# Patient Record
Sex: Female | Born: 1975 | Race: White | Hispanic: No | Marital: Married | Smoking: Never smoker
Health system: Southern US, Community
[De-identification: ages and names within clinical notes are randomized; demographics above are authoritative.]

## PROBLEM LIST (undated history)

## (undated) DIAGNOSIS — J189 Pneumonia, unspecified organism: Secondary | ICD-10-CM

## (undated) DIAGNOSIS — C719 Malignant neoplasm of brain, unspecified: Secondary | ICD-10-CM

## (undated) DIAGNOSIS — N200 Calculus of kidney: Secondary | ICD-10-CM

## (undated) DIAGNOSIS — D496 Neoplasm of unspecified behavior of brain: Secondary | ICD-10-CM

## (undated) DIAGNOSIS — L509 Urticaria, unspecified: Secondary | ICD-10-CM

## (undated) DIAGNOSIS — J069 Acute upper respiratory infection, unspecified: Secondary | ICD-10-CM

## (undated) DIAGNOSIS — J45909 Unspecified asthma, uncomplicated: Secondary | ICD-10-CM

## (undated) DIAGNOSIS — M1990 Unspecified osteoarthritis, unspecified site: Secondary | ICD-10-CM

## (undated) DIAGNOSIS — G43909 Migraine, unspecified, not intractable, without status migrainosus: Secondary | ICD-10-CM

## (undated) DIAGNOSIS — C801 Malignant (primary) neoplasm, unspecified: Secondary | ICD-10-CM

## (undated) DIAGNOSIS — F419 Anxiety disorder, unspecified: Secondary | ICD-10-CM

## (undated) HISTORY — PX: CYSTOSTOMY W/ STENT INSERTION: SHX1434

## (undated) HISTORY — DX: Acute upper respiratory infection, unspecified: J06.9

## (undated) HISTORY — PX: LASER ABLATION OF THE CERVIX: SHX1949

## (undated) HISTORY — PX: CHOLECYSTECTOMY: SHX55

## (undated) HISTORY — DX: Urticaria, unspecified: L50.9

---

## 2002-04-12 ENCOUNTER — Other Ambulatory Visit: Admission: RE | Admit: 2002-04-12 | Discharge: 2002-04-12 | Payer: Self-pay | Admitting: Obstetrics and Gynecology

## 2003-01-25 ENCOUNTER — Encounter: Payer: Self-pay | Admitting: Emergency Medicine

## 2003-01-25 ENCOUNTER — Inpatient Hospital Stay (HOSPITAL_COMMUNITY): Admission: EM | Admit: 2003-01-25 | Discharge: 2003-01-27 | Payer: Self-pay | Admitting: Emergency Medicine

## 2003-01-27 ENCOUNTER — Encounter: Payer: Self-pay | Admitting: Urology

## 2003-02-07 ENCOUNTER — Emergency Department (HOSPITAL_COMMUNITY): Admission: EM | Admit: 2003-02-07 | Discharge: 2003-02-08 | Payer: Self-pay | Admitting: Emergency Medicine

## 2003-02-08 ENCOUNTER — Encounter: Payer: Self-pay | Admitting: Emergency Medicine

## 2003-02-20 ENCOUNTER — Encounter: Payer: Self-pay | Admitting: Emergency Medicine

## 2003-02-20 ENCOUNTER — Emergency Department (HOSPITAL_COMMUNITY): Admission: EM | Admit: 2003-02-20 | Discharge: 2003-02-20 | Payer: Self-pay | Admitting: Emergency Medicine

## 2003-02-22 ENCOUNTER — Ambulatory Visit (HOSPITAL_COMMUNITY): Admission: RE | Admit: 2003-02-22 | Discharge: 2003-02-22 | Payer: Self-pay | Admitting: Urology

## 2003-02-22 ENCOUNTER — Encounter: Payer: Self-pay | Admitting: Urology

## 2003-04-15 ENCOUNTER — Other Ambulatory Visit: Admission: RE | Admit: 2003-04-15 | Discharge: 2003-04-15 | Payer: Self-pay | Admitting: Obstetrics and Gynecology

## 2003-06-04 ENCOUNTER — Emergency Department (HOSPITAL_COMMUNITY): Admission: EM | Admit: 2003-06-04 | Discharge: 2003-06-04 | Payer: Self-pay | Admitting: Emergency Medicine

## 2003-07-31 ENCOUNTER — Ambulatory Visit (HOSPITAL_COMMUNITY): Admission: RE | Admit: 2003-07-31 | Discharge: 2003-07-31 | Payer: Self-pay | Admitting: Obstetrics and Gynecology

## 2003-08-27 ENCOUNTER — Inpatient Hospital Stay (HOSPITAL_COMMUNITY): Admission: AD | Admit: 2003-08-27 | Discharge: 2003-08-27 | Payer: Self-pay | Admitting: Obstetrics and Gynecology

## 2003-08-28 ENCOUNTER — Inpatient Hospital Stay (HOSPITAL_COMMUNITY): Admission: AD | Admit: 2003-08-28 | Discharge: 2003-08-28 | Payer: Self-pay | Admitting: Obstetrics and Gynecology

## 2003-10-09 ENCOUNTER — Inpatient Hospital Stay (HOSPITAL_COMMUNITY): Admission: AD | Admit: 2003-10-09 | Discharge: 2003-10-09 | Payer: Self-pay | Admitting: Obstetrics and Gynecology

## 2003-11-01 ENCOUNTER — Inpatient Hospital Stay (HOSPITAL_COMMUNITY): Admission: AD | Admit: 2003-11-01 | Discharge: 2003-11-21 | Payer: Self-pay | Admitting: Obstetrics and Gynecology

## 2003-11-22 ENCOUNTER — Inpatient Hospital Stay (HOSPITAL_COMMUNITY): Admission: AD | Admit: 2003-11-22 | Discharge: 2003-12-06 | Payer: Self-pay | Admitting: Obstetrics and Gynecology

## 2003-12-17 ENCOUNTER — Inpatient Hospital Stay (HOSPITAL_COMMUNITY): Admission: RE | Admit: 2003-12-17 | Discharge: 2003-12-20 | Payer: Self-pay | Admitting: Obstetrics and Gynecology

## 2004-02-06 ENCOUNTER — Ambulatory Visit (HOSPITAL_COMMUNITY): Admission: RE | Admit: 2004-02-06 | Discharge: 2004-02-06 | Payer: Self-pay | Admitting: Urology

## 2004-12-23 ENCOUNTER — Other Ambulatory Visit: Admission: RE | Admit: 2004-12-23 | Discharge: 2004-12-23 | Payer: Self-pay | Admitting: Obstetrics and Gynecology

## 2005-08-28 ENCOUNTER — Emergency Department (HOSPITAL_COMMUNITY): Admission: EM | Admit: 2005-08-28 | Discharge: 2005-08-28 | Payer: Self-pay | Admitting: Emergency Medicine

## 2006-04-26 ENCOUNTER — Emergency Department (HOSPITAL_COMMUNITY): Admission: EM | Admit: 2006-04-26 | Discharge: 2006-04-26 | Payer: Self-pay | Admitting: Emergency Medicine

## 2006-06-17 ENCOUNTER — Emergency Department (HOSPITAL_COMMUNITY): Admission: EM | Admit: 2006-06-17 | Discharge: 2006-06-17 | Payer: Self-pay | Admitting: Emergency Medicine

## 2007-05-25 ENCOUNTER — Inpatient Hospital Stay (HOSPITAL_COMMUNITY): Admission: EM | Admit: 2007-05-25 | Discharge: 2007-05-26 | Payer: Self-pay | Admitting: Emergency Medicine

## 2007-05-29 ENCOUNTER — Emergency Department (HOSPITAL_COMMUNITY): Admission: EM | Admit: 2007-05-29 | Discharge: 2007-05-30 | Payer: Self-pay | Admitting: Emergency Medicine

## 2009-10-19 ENCOUNTER — Emergency Department (HOSPITAL_COMMUNITY): Admission: EM | Admit: 2009-10-19 | Discharge: 2009-10-19 | Payer: Self-pay | Admitting: Emergency Medicine

## 2010-04-21 ENCOUNTER — Ambulatory Visit (HOSPITAL_COMMUNITY): Payer: Self-pay | Admitting: Psychiatry

## 2010-06-19 ENCOUNTER — Encounter: Admission: RE | Admit: 2010-06-19 | Discharge: 2010-06-19 | Payer: Self-pay | Admitting: Family Medicine

## 2010-06-27 ENCOUNTER — Encounter: Admission: RE | Admit: 2010-06-27 | Discharge: 2010-06-27 | Payer: Self-pay | Admitting: Neurological Surgery

## 2010-09-15 ENCOUNTER — Emergency Department (HOSPITAL_BASED_OUTPATIENT_CLINIC_OR_DEPARTMENT_OTHER)
Admission: EM | Admit: 2010-09-15 | Discharge: 2010-09-15 | Payer: Self-pay | Source: Home / Self Care | Admitting: Emergency Medicine

## 2010-09-16 ENCOUNTER — Inpatient Hospital Stay (HOSPITAL_COMMUNITY)
Admission: EM | Admit: 2010-09-16 | Discharge: 2010-09-21 | Payer: Self-pay | Source: Home / Self Care | Attending: Internal Medicine | Admitting: Internal Medicine

## 2010-10-24 ENCOUNTER — Encounter: Payer: Self-pay | Admitting: Obstetrics and Gynecology

## 2010-12-14 LAB — CBC
HCT: 29.8 % — ABNORMAL LOW (ref 36.0–46.0)
HCT: 35.6 % — ABNORMAL LOW (ref 36.0–46.0)
MCHC: 33.6 g/dL (ref 30.0–36.0)
MCHC: 34.6 g/dL (ref 30.0–36.0)
MCV: 87.4 fL (ref 78.0–100.0)
MCV: 87.4 fL (ref 78.0–100.0)
MCV: 87.7 fL (ref 78.0–100.0)
Platelets: 128 10*3/uL — ABNORMAL LOW (ref 150–400)
Platelets: 145 10*3/uL — ABNORMAL LOW (ref 150–400)
Platelets: 163 10*3/uL (ref 150–400)
Platelets: 245 10*3/uL (ref 150–400)
RBC: 4.06 MIL/uL (ref 3.87–5.11)
RDW: 12.6 % (ref 11.5–15.5)
RDW: 12.9 % (ref 11.5–15.5)
RDW: 13 % (ref 11.5–15.5)
RDW: 13.1 % (ref 11.5–15.5)
WBC: 5.5 10*3/uL (ref 4.0–10.5)
WBC: 6 10*3/uL (ref 4.0–10.5)
WBC: 7.2 10*3/uL (ref 4.0–10.5)
WBC: 7.9 10*3/uL (ref 4.0–10.5)

## 2010-12-14 LAB — BASIC METABOLIC PANEL
BUN: 2 mg/dL — ABNORMAL LOW (ref 6–23)
BUN: 3 mg/dL — ABNORMAL LOW (ref 6–23)
BUN: 6 mg/dL (ref 6–23)
CO2: 14 mEq/L — ABNORMAL LOW (ref 19–32)
Calcium: 8.1 mg/dL — ABNORMAL LOW (ref 8.4–10.5)
Calcium: 8.1 mg/dL — ABNORMAL LOW (ref 8.4–10.5)
Calcium: 8.4 mg/dL (ref 8.4–10.5)
Chloride: 112 mEq/L (ref 96–112)
Chloride: 114 mEq/L — ABNORMAL HIGH (ref 96–112)
Creatinine, Ser: 0.65 mg/dL (ref 0.4–1.2)
Creatinine, Ser: 0.7 mg/dL (ref 0.4–1.2)
GFR calc Af Amer: >60 mL/min (ref >60)
GFR calc Af Amer: >60 mL/min (ref >60)
GFR calc Af Amer: >60 mL/min (ref >60)
GFR calc non Af Amer: >60 mL/min (ref >60)
GFR calc non Af Amer: >60 mL/min (ref >60)
GFR calc non Af Amer: >60 mL/min (ref >60)
GFR calc non Af Amer: >60 mL/min (ref >60)
Glucose, Bld: 91 mg/dL (ref 70–99)
Glucose, Bld: 92 mg/dL (ref 70–99)
Potassium: 3.1 mEq/L — ABNORMAL LOW (ref 3.5–5.1)
Potassium: 3.1 mEq/L — ABNORMAL LOW (ref 3.5–5.1)
Potassium: 3.5 mEq/L (ref 3.5–5.1)
Potassium: 3.6 mEq/L (ref 3.5–5.1)
Sodium: 135 mEq/L (ref 135–145)
Sodium: 137 mEq/L (ref 135–145)
Sodium: 140 mEq/L (ref 135–145)

## 2010-12-14 LAB — DIFFERENTIAL
Basophils Absolute: 0 10*3/uL (ref 0.0–0.1)
Basophils Absolute: 0.1 10*3/uL (ref 0.0–0.1)
Eosinophils Relative: 0 % (ref 0–5)
Eosinophils Relative: 2 % (ref 0–5)
Lymphocytes Relative: 24 % (ref 12–46)
Lymphocytes Relative: 39 % (ref 12–46)
Lymphs Abs: 2.3 10*3/uL (ref 0.7–4.0)
Monocytes Relative: 10 % (ref 3–12)
Neutro Abs: 2.9 10*3/uL (ref 1.7–7.7)
Neutro Abs: 4.6 10*3/uL (ref 1.7–7.7)
Neutrophils Relative %: 59 % (ref 43–77)

## 2010-12-14 LAB — URINALYSIS, ROUTINE W REFLEX MICROSCOPIC
Protein, ur: 30 mg/dL — AB
Specific Gravity, Urine: 1.021 (ref 1.005–1.030)
Urobilinogen, UA: 1 mg/dL (ref 0.0–1.0)

## 2010-12-14 LAB — GIARDIA/CRYPTOSPORIDIUM SCREEN(EIA)
Cryptosporidium Screen (EIA): NEGATIVE
Cryptosporidium Screen (EIA): NEGATIVE
Giardia Screen - EIA: NEGATIVE
Giardia Screen - EIA: NEGATIVE

## 2010-12-14 LAB — POCT I-STAT, CHEM 8
BUN: 9 mg/dL (ref 6–23)
Calcium, Ion: 1.1 mmol/L — ABNORMAL LOW (ref 1.12–1.32)
Creatinine, Ser: 1 mg/dL (ref 0.4–1.2)
Glucose, Bld: 94 mg/dL (ref 70–99)
Hemoglobin: 9.9 g/dL — ABNORMAL LOW (ref 12.0–15.0)
TCO2: 19 mmol/L (ref 0–100)

## 2010-12-14 LAB — STOOL CULTURE

## 2010-12-14 LAB — FECAL LACTOFERRIN, QUANT: Fecal Lactoferrin: POSITIVE

## 2010-12-14 LAB — HEMOCCULT GUIAC POC 1CARD (OFFICE): Fecal Occult Bld: POSITIVE

## 2010-12-14 LAB — URINE MICROSCOPIC-ADD ON

## 2010-12-14 LAB — CLOSTRIDIUM DIFFICILE BY PCR: Toxigenic C. Difficile by PCR: NEGATIVE

## 2010-12-15 LAB — URINALYSIS, ROUTINE W REFLEX MICROSCOPIC
Leukocytes, UA: NEGATIVE
Nitrite: NEGATIVE
Specific Gravity, Urine: 1.027 (ref 1.005–1.030)
pH: 6 (ref 5.0–8.0)

## 2010-12-15 LAB — PREGNANCY, URINE: Preg Test, Ur: NEGATIVE

## 2010-12-15 LAB — URINE MICROSCOPIC-ADD ON

## 2010-12-15 LAB — CBC
Hemoglobin: 13.7 g/dL (ref 12.0–15.0)
MCV: 85.8 fL (ref 78.0–100.0)
Platelets: 148 10*3/uL — ABNORMAL LOW (ref 150–400)
RBC: 4.58 MIL/uL (ref 3.87–5.11)
WBC: 7.1 10*3/uL (ref 4.0–10.5)

## 2010-12-15 LAB — DIFFERENTIAL
Lymphocytes Relative: 12 % (ref 12–46)
Lymphs Abs: 0.8 10*3/uL (ref 0.7–4.0)
Neutrophils Relative %: 76 % (ref 43–77)

## 2010-12-15 LAB — BASIC METABOLIC PANEL
Chloride: 106 mEq/L (ref 96–112)
Creatinine, Ser: 0.8 mg/dL (ref 0.4–1.2)
GFR calc Af Amer: >60 mL/min (ref >60)
Sodium: 140 mEq/L (ref 135–145)

## 2010-12-20 LAB — POCT PREGNANCY, URINE: Preg Test, Ur: NEGATIVE

## 2010-12-20 LAB — URINALYSIS, ROUTINE W REFLEX MICROSCOPIC
Bilirubin Urine: NEGATIVE
Hgb urine dipstick: NEGATIVE
Nitrite: NEGATIVE
Specific Gravity, Urine: 1.013 (ref 1.005–1.030)
pH: 6 (ref 5.0–8.0)

## 2010-12-20 LAB — URINE CULTURE

## 2010-12-20 LAB — URINE MICROSCOPIC-ADD ON

## 2011-02-16 NOTE — Discharge Summary (Signed)
NAMEDAVONA, KINOSHITA NO.:  [id number]   MEDICAL RECORD NO.:  [id number]          PATIENT TYPE:  INP   LOCATION:  1428                         FACILITY:  [facility]   PHYSICIAN:  Lindaann Slough, M.D.  DATE OF BIRTH:  12/23/1975   DATE OF ADMISSION:  05/24/2007  DATE OF DISCHARGE:                               DISCHARGE SUMMARY   DISCHARGE DIAGNOSIS:  Left ureteral stone, bilateral renal calculi.   PROCEDURE:  Cystoscopy with left retrograde pyelogram, ureteroscopy,  stone extraction and insertion of double-J catheter on May 26, 2007.   The patient is a 35 year old female who was admitted on May 24, 2007  with left flank and left lower quadrant pain associated with nausea and  vomiting and difficulty voiding.  She has a history of kidney stones and  she has not had any problems with kidney stones for about 2 years.  She  had ESL in the past.  CT scan showed bilateral renal calculi and a 6 x  10 mm stone in the left distal ureter.  The patient was admitted for  analgesics and treatment of the ureteral calculus.  She continued to  complain of pain and on 08/22 she had cystoscopy and stone extraction.Marland Kitchen  Postoperatively she did well.  She did not have any more flank pain.  She had left flank discomfort.  She was voiding on her own.  She was  then discharged home on Dilaudid 4 mg every 4-6 hours  p.r.n. for pain,  Ditropan 5 mg three times a day, Phenergan 25 mg every 6 hours p.r.n.  for nausea and vomiting.  Lamictal 300 mg a day, Citracal Plus 200 mg  twice a day,  Omeprazole 20 mg a day, Pulmicort 1 puff twice a day,  Rhinocort 1 spray each nostril daily, Zoloft 200 mg daily,  Zonisamide  300 mg at night.  Zyrtec 10 mg at night.   CONDITION ON DISCHARGE:  Improved.   DISCHARGE DIET:  Regular.   The patient may resume her prehospital activities.  She will return to  the office in 3 days for stent removal.      Lindaann Slough, M.D.  Electronically  Signed     MN/MEDQ  D:  05/26/2007  T:  05/28/2007  Job:  [id number]

## 2011-02-16 NOTE — Op Note (Signed)
[name], SEN NO.:  [id number]   MEDICAL RECORD NO.:  [id number]          PATIENT TYPE:  INP   LOCATION:  1428                         FACILITY:  [facility]   PHYSICIAN:  Lindaann Slough, M.D.  DATE OF BIRTH:  September 08, 1976   DATE OF PROCEDURE:  05/26/2007  DATE OF DISCHARGE:                               OPERATIVE REPORT   PREOPERATIVE DIAGNOSIS:  Left ureteral stone.   POSTOPERATIVE DIAGNOSIS:  Left ureteral stone.   PROCEDURE:  Cystoscopy, left retrograde pyelogram, ureteroscopy with  Holmium laser, stone extraction, and insertion of the left double-J  catheter.   SURGEON:  Danae Chen, M.D.   ANESTHESIA:  General.   INDICATIONS:  The patient is a 35 year old female who was admitted two  days ago with sudden onset of severe left flank and left lower quadrant  pain.  CT scan showed a 6 x 10 mm stone in the left distal ureter.  She  was then treated with IV analgesic, however, she continued to complain  of pain.  She is brought to the OR for cystoscopy and stone  manipulation.   DESCRIPTION OF PROCEDURE:  Under general anesthesia, the patient was  prepped and draped and placed in the dorsal lithotomy position.  A  panendoscope was inserted in the bladder.  The bladder mucosa is normal.  There is no stone or tumor in the bladder.  The ureteral orifices are in  normal position and shape.   Retrograde pyelogram:   A cone tip catheter was passed through the cystoscope into the left  ureteral orifice. Contrast was then injected through the cone tipped  catheter.  There is a filling defect in the distal ureter and there is a  moderate proximal hydroureter.  The renal pelvis and calices appear  normal.  The cone tipped catheter was then removed.   A Sensor tip guidewire was then passed through the cystoscope into the  left ureter.  The cystoscope was then removed.  A number 6.5 French  semirigid ureteroscope was then passed in the bladder and into the  ureter.  There was some edema of the distal ureter distal to the stone  which was then visualized.  With the 365 microfiber Holmium laser, the  stone was broken up in multiple small fragments.  The stone fragments  were then removed with the nitinol basket and extracted.   Second retrograde pyelogram:   Contrast was injected through the ureteroscope and there was no evidence  of filling defect in the ureter.  There is no extravasation of contrast.   The ureteroscope was then removed.  The guidewire was then back loaded  into the cystoscope and a 6-French 26 double-J catheter was passed over  the guidewire.  The proximal curl of the double-J catheter is in the  renal pelvis.  The distal curl is in the bladder.  The bladder was then  emptied and the cystoscope and guidewire removed.  The patient tolerated  the procedure well and left the OR in satisfactory condition to the post  anesthesia care unit.      Lindaann Slough, M.D.  Electronically Signed  MN/MEDQ  D:  05/26/2007  T:  05/27/2007  Job:  [id number]

## 2011-02-19 NOTE — H&P (Signed)
NAMEMarland Snyder  Christy, Snyder NO.:  [id number]   MEDICAL RECORD NO.:  [id number]                   PATIENT TYPE:  MAT   LOCATION:  MATC                                 FACILITY:  [facility]   PHYSICIAN:  Crist Fat. Rivard, M.D.              DATE OF BIRTH:  [birth year]   DATE OF ADMISSION:  11/22/2003  DATE OF DISCHARGE:                                HISTORY & PHYSICAL   HISTORY OF PRESENT ILLNESS:  [name] is a 35 year old gravida 2 para 1-  0-0-1 at 74 and five-sevenths weeks who is admitted for recurrence of kidney  stone pain.  Her history was remarkable for being admitted on November 01, 2003 with severe kidney stone pain.  She was diagnosed as having three  kidney stones at that time and passed one during her hospitalization.  She  was discharged home on November 21, 2003 and was hoping to be managed on  p.o. pain medication at home.  However, her pain began to increase at 5 a.m.  this morning and since that time she has had nausea and vomiting with the  inability to keep any food or fluids down.  Her last medication was Percocet  at 9 a.m. today.  She denies any abdominal cramping, vaginal discharge or  bleeding, or uterine contractions.  She does report positive fetal movement.  She denies any fever or any other issues.  She is complaining of bilateral  CVA tenderness although it is slightly more on the left than on the right.   Pregnancy has been remarkable for:  1. Recurrent kidney stones, multiple episodes since 2001 delivery.  2. Previous cesarean section with a plan for repeat.  3. Rh negative.  4. History of postpartum depression.  5. History of OCD.  6. Asthma.  7. Gastroesophageal reflux disease.  8. Migraines.   PRENATAL LABORATORY DATA:  Blood type is O negative, Rh antibody negative.  VDRL nonreactive.  Rubella titer positive.  Hepatitis B surface antigen  negative.  HIV nonreactive.  GC and chlamydia cultures were negative.  Pap  was  normal.  Hemoglobin upon entering the practice was 11.8; it was 10.2 on  November 13, 2003.  EDC of January 04, 2004 was established by last menstrual  period and was in agreement with ultrasounds at approximately 8 and 18  weeks.   HISTORY OF PRESENT PREGNANCY:  The patient entered care at approximately 11  weeks.  She had had a previous C-section and was originally undecided about  VBAC or repeat cesarean.  She now wishes to proceed with repeat cesarean  after 37 weeks.  She does have a history of migraines.  She is on  medications for her asthma.  She did have a syncopal spell at 19 weeks but  had no problems from that.  [facility] has [facility]atica.  She has also had some episodes of  diarrhea.  She was referred to Dr. Blossom Hoops office for GI evaluation.  She had kidney  stones in December but did pass those spontaneously.  The rest of her  pregnancy was uncomplicated until January 28 when she began to have  bilateral kidney stone pain again and was admitted at that time until  February 17.   In the course of that hospitalization she was maintained on a morphine PCA.  She had originally been started on Dilaudid PCA but that caused increased  itching.  She then was placed on p.o. medications but this was inadequate  for pain control.  She was then placed on the morphine PCA.  She passed a  stone on January 30.  Dr. Brunilda Payor was consulted as the patient's urologist.  She was also placed on Septra.  She had an ultrasound at 31-and-a-half weeks  that showed normal growth and development with growth of greater than the  95th percentile.  Renal ultrasound showed definitive stones.  She had a  migraine at approximately 31-and-a-half weeks.  She declined ureteral  stents, betamethasone, and a amniocentesis.  She had a urine culture that  had 60,000 colonies of enterococcus that was sensitive to ampicillin,  vancomycin, nitrofurantoin, and levofloxacin.   She was changed from [place]  to [facility] on November 07, 2003.  She did have some emotional upsets with  the long-term hospitalization and depression.  Fetal heart rate throughout  her hospitalization remained reassuring.  She had no contractions.  She did  begin to have some difficulty with constipation on February 7.  She had  another renal ultrasound on February 7 that showed two calcifications in the  left kidney.  She began to have more pain with her stones as she felt that  she might be passing them on November 12, 2003.  She had an epidural placed  on February 9 secondary to increased pain.  She did have some constipation  at that time that was controlled with Imodium.  She was again placed on  morphine PCA after the epidural was discontinued.  She was given a NICU  tour. On February 17 the patient had p.o. Percocet that morning with good  relief.  She had not used the PCA that morning.  She really did want to go  home, so she had an NST that day that was normal.  She was given a  prescription for Percocet and was sent home.  There was a discussion with  Dr. Pennie Rushing and Dr. Eric Form, neonatologist, regarding the possibility of  habituation for the baby to morphine as a pain medication, that delivery now  would probably be associated with some degree of fetal withdrawal.  The  decision was made for the patient to go home in agreement with her wishes,  and the patient had decided she would try to use her p.o. narcotics  minimally.  She was therefore discharged home on February 17 and continued  to plan a C-section no earlier than the completion of 37 weeks.   OBSTETRICAL HISTORY:  In 2001 she had a primary low transverse cesarean  section for a female infant that weighed 8 pounds 12 ounces at 40 and five-  sevenths weeks.  She was sectioned for failure to progress.  She had an  epidural.  She did have postpartum anemia.  She had postpartum depression. She was Rh negative and did receive  RhoGAM.  She was also positive for beta  strep during that time.   MEDICAL HISTORY:  She has a history of gastroesophageal reflux disease.  She  is being treated for OCD by Rowland Lathe, the psychiatrist.  She has been  on Zoloft during her pregnancy to a maximum of 200 mg a day which was being  trying to be weaned down.  She is now on 150 mg per day.  She also has a  history of asthma for which she uses medications Pulmicort, albuterol,  Rhinocort, and Zyrtec.  She uses Prevacid for her gastroesophageal reflux  disease.   ALLERGIES:  PENICILLIN causes anaphylactic shock.   FAMILY HISTORY:  Maternal grandmother, paternal grandmother, and paternal  grandfather all have heart disease.  Mother has hypertension.  Paternal  grandmother has diabetes. Paternal grandmother was an alcoholic.   GENETIC HISTORY:  Unremarkable.   SOCIAL HISTORY:  The patient is married to the father of the baby.  He is  involved and supportive.  His name is [employer].  The patient is  graduate-educated.  She is a Futures trader.  Her husband is also graduate-  educated.  He is a Production designer, theatre/television/film.  She has been followed by the physician service  at [facility].  She denies any alcohol, drug, or tobacco use during  this pregnancy   CURRENT MEDICATIONS:  1. Albuterol two puffs q.6h. p.r.n.  2. One Source prenatal vitamin two caplets once a day.  3. Prevacid 30 mg one p.o. daily.  4. Pulmicort two puffs twice daily.  5. Rhinocort Aqua one spray each nostril daily.  6. Zoloft 150 mg currently daily.  7. Zyrtec 10 mg one p.o. q.h.s.   PHYSICAL EXAMINATION:  GENERAL:  The patient is a well-developed white  female in moderate distress secondary to kidney stone pain.  VITAL SIGNS:  Stable, the patient is afebrile.  HEENT:  Within normal limits.  LUNGS:  Bilateral breath sounds are clear.  HEART:  Regular rate and rhythm without murmur.  BREASTS:  Soft and nontender.  ABDOMEN:  Fundal height is approximately 31  cm.  Abdomen is soft and  generally nontender except in the left lower groin area with mild  tenderness.  There is no rebound, guarding, or other pain.  Positive CVA  tenderness is noted on both sides.  Fetal monitoring reveals a reactive  fetal heart rate tracing with no decelerations and no contractions are  noted.  PELVIC:  Deferred at this time.  LABORATORY DATA:  CBC shows a hemoglobin of 10.6, hematocrit of 31.8, white  blood cell count of 11.7, and platelets of 291.  Comprehensive metabolic  panel is normal with a BUN of 6, a creatinine of 0.4, glucose of 82,  potassium 3.8.  Liver function tests are normal.  BACK:  Bilateral CVA tenderness is noted.  EXTREMITIES:  Deep tendon reflexes 2+ without clonus.  There is a trace  edema noted.   IMPRESSION:  1. Intrauterine pregnancy at 6 and five-sevenths weeks.  2. Kidney stones with recurrent pain.  3. Depression.  4. History of obsessive-compulsive disorder.  5. Asthma. 6. Gastroesophageal reflux disease.   PLAN:  1. Admit for 23-hour observation per consult with Dr. Estanislado Pandy as attending     physician.  2. IV of LR at 250 mL/hour.  3. Morphine PCA.  4. Medications:     a. Albuterol two puffs q.6h. p.r.n.     b. Prenatal vitamin one p.o. daily.     c. Prevacid 30 mg daily.     d. Pulmicort two puffs twice daily.  e. Rhinocort Aqua one spray each nostril daily.     f. Zoloft 150 mg p.o. daily.     g. Zyrtec 10 mg one p.o. q.h.s.     h. Phenergan 25 mg IV q.6h. p.r.n. for nausea.        a. Morphine PCA initially with half dose then may advance to full dose           on a p.r.n. basis.  5. NST daily.     Renaldo Reel Emilee Hero, C.N.M.                   Crist Fat Rivard, M.D.    Leeanne Mannan  D:  11/22/2003  T:  11/22/2003  Job:  [id number]

## 2011-02-19 NOTE — H&P (Signed)
NAME:  [name], [name]                    ACCOUNT NO.:  [id number]   MEDICAL RECORD NO.:  [id number]                   PATIENT TYPE:  INP   LOCATION:  9198                                 FACILITY:  [facility]   PHYSICIAN:  Hal Morales, M.D.             DATE OF BIRTH:  [birth year]   DATE OF ADMISSION:  12/17/2003  DATE OF DISCHARGE:                                HISTORY & PHYSICAL   HISTORY OF PRESENT ILLNESS:  Ms. Doland is a 35 year old, married, white  female, G2, P1-0-0-1 at 33 weeks' gestation admitted for elective repeat  Cesarean section today.  She denies any regular uterine contractions.  She  reports positive fetal movement.  She denies any leaking or vaginal  bleeding.  Her pregnancy has been followed at [facility] by the  M.D. service and has been complicated by kidney stones requiring  hospitalization and p.o. morphine at home recently, history of previous  Cesarean section, history of OCD and depression, history of asthma and  gastroesophageal reflux disease.   PAST OBSTETRICAL HISTORY:  She is a G2, P1-0-0-1 who had a primary Cesarean  section for delivery a viable female infant for failure to progress in May  2001.  The baby weighed 8 pounds 12 ounces.  Her LMP for this pregnancy was  March 29, 2003, given her an [facility] of January 04, 2004, confirmed by ultrasound.  She is Rh negative and has received RhoGAM with this pregnancy.   ALLERGIES:  PENICILLIN that causes anaphylaxis.   PAST MEDICAL HISTORY:  1. She reports having the usual childhood diseases.  2. She reports a history of asthma requiring Rhinocort, Pulmicort and     albuterol p.r.n.  3. History of GERD with this pregnancy and has taken Nexium.  4. OCD/depression for which she has been on Zoloft 200 mg q.d. with this     pregnancy.  5. History of kidney stones that she initially passed in November 2001,     February 2002, May 2004, and throughout this pregnancy was hospitalized     since  late January through end of February for IV pain  medicine     management of her kidney stones.  She ultimately passed a couple of     kidney stones and went home and did have p.o. morphine to take as needed     for pain there, but has done well subsequent to passing the kidney     stones.   PAST SURGICAL HISTORY:  1. Laparoscopic cholecystectomy in 2001.  2. Wisdom teeth removal.   FAMILY HISTORY:  Significant for maternal grandmother, paternal grandmother  and paternal grandfather with heart disease.  Mother with chronic  hypertension.  Paternal grandmother with diabetes.  Paternal grandmother  with alcohol use.   GENETIC HISTORY:  Negative.   SOCIAL HISTORY:  She is married to [employer] who is involved and  supportive.  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  He  is also Masters Degree educated and employed full-time.  They are of the  [place] faith.  They deny any illicit drug use, alcohol or smoking during  this pregnancy.   PRENATAL LABORATORY DATA:  Blood type O negative, antibody screen negative,  Syphilis nonreactive.  Rubella positive.  Hepatitis B surface antigen is  negative.  HIV nonreactive.  GC and Chlamydia both negative.  Pap smear  within normal limits in July 2004.  Her TSH was normal and her one-hour  Glucola was normal throughout this pregnancy.   PHYSICAL EXAMINATION:  VITAL SIGNS:  Stable, afebrile.  HEENT:  Grossly within normal limits.  HEART:  Regular rate and rhythm.  CHEST:  Clear.  BREASTS:  Soft and nontender.  ABDOMEN:  Gravid, fetal heart rate reassuring.  No regular uterine  contractions are noted.  PELVIC:  Deferred.   ASSESSMENT:  1. Intrauterine pregnancy at term.  2. History of kidney stones throughout this pregnancy.  3. Previous cesarean section desiring repeat and desiring bilateral tubal     ligation.   PLAN:  Admit to [facility] for repeat Cesarean section per Dr. Dierdre Forth.     Concha Pyo. Duplantis, C.N.M.              Hal Morales, M.D.    SJD/MEDQ  D:  12/17/2003  T:  12/17/2003  Job:  [id number]

## 2011-02-19 NOTE — H&P (Signed)
[name], [name] NO.:  [id number]   MEDICAL RECORD NO.:  [id number]                   PATIENT TYPE:  INP   LOCATION:  9162                                 FACILITY:  [facility]   PHYSICIAN:  Janine Limbo, M.D.            DATE OF BIRTH:  10-25-1975   DATE OF ADMISSION:  11/01/2003  DATE OF DISCHARGE:                                HISTORY & PHYSICAL   HISTORY OF PRESENT ILLNESS:  [name] is a 35 year old gravida 2 para  1-0-0-1 at 6 and six-sevenths weeks who presented from the office with  probable recurrence of kidney stones.  She presented to the office today for  complaint of leaking of fluid.  She was evaluated at [facility]  with no evidence of spontaneous rupture.  Her cervix was closed and long.  Her urine had 3+ blood at [facility].  The decision was that she  probably was having some bladder spasms.  She did have a history of passing  three kidney stones in December and was under Dr. Madilyn Hook care.  She reports  nausea, no vomiting, and no dysuria.  The pain began early this morning and  is essentially bilateral extending into hips and into the left groin.  Pregnancy has been remarkable for:  1. Recurrent kidney stones with multiple episodes since 2001 delivery.  2. Previous cesarean section with plan for repeat.  3. Rh negative.  4. History of postpartum depression.  5. History of OCD.  6. Asthma.  7. Gastroesophageal reflux disease.  8. Migraines.   PRENATAL LABORATORY DATA:  Blood type is O negative, Rh antibody negative.  VDRL nonreactive.  Rubella titer positive.  Hepatitis B surface antigen  negative.  HIV nonreactive.  GC and chlamydia cultures were negative.  Pap  was normal in July.  Hemoglobin upon entering the practice was 11.8; it was  within normal limits at 28 weeks.  EDC of January 04, 2004 was established by  last menstrual period and was in agreement with ultrasound at approximately  8 and 18  weeks.   HISTORY OF PRESENT PREGNANCY:  The patient entered care at approximately 11  weeks.  She had had a previous C-section and was originally undecided about  VBAC or repeat cesarean.  She now has consented for repeat cesarean.  She  does have a history of migraines.  She is on significant medication for  asthma from her allergist.  She did have a syncopal spell at 19 weeks but  had no problems from that.  She has been followed at [facility] for sciatica.  She  also had some significant painful diarrhea.  She was referred to Dr.  Blossom Hoops office for GI evaluation.  She had kidney stones in December, did  pass those spontaneously.  The rest of her pregnancy was essentially  uncomplicated until today when her symptoms began.   OBSTETRICAL HISTORY:  In 2001 the patient had a primary  low transverse  cesarean section for a female infant, weight 8 pounds 12 ounces, at 40 and  five-sevenths weeks.  She was sectioned for failure to progress.  She did  have an epidural.  She did have postpartum anemia.  She had postpartum  depression with her first pregnancy.  She was Rh negative and did receive  RhoGAM.  She also was positive for group B strep during that time.   MEDICAL HISTORY:  She has a history of gastroesophageal reflux disease.  She  also is treated for OCD by Rowland Lathe, the psychiatrist.  She has been on  Zoloft during her pregnancy.  She also has a history of asthma for which she  uses routine medications.   SURGICAL HISTORY:  Includes in 2001 she had a laparoscopic cholecystectomy  and wisdom teeth removed.  She has had kidney stones in November 2001,  February 2002, May 2004, and December 2004.   ALLERGIES:  PENICILLIN which causes anaphylactic shock.   FAMILY HISTORY:  Maternal grandmother, paternal grandmother, and paternal  grandfather all have heart disease.  Her mother has hypertension.  Her  paternal grandmother has diabetes.  Paternal grandmother was an alcoholic.    GENETIC HISTORY:  Unremarkable.   SOCIAL HISTORY:  The patient is married to the father of the baby.  He is  involved and supportive.  His name is [employer].  The patient is  graduate-educated.  She is a Futures trader.  Her husband is also graduate-  educated.  He is a Scientist, clinical (histocompatibility and immunogenetics).  She has been followed by the physician  service at [facility].  She denies any alcohol, drug, or tobacco  use during this pregnancy.   PHYSICAL EXAMINATION:  VITAL SIGNS:  Stable; the patient is afebrile.  HEENT:  Within normal limits.  LUNGS:  Bilateral breath sounds are clear.  HEART:  Regular rate and rhythm without murmur.  BREASTS:  Soft and nontender.  ABDOMEN:  Fundal height is approximately 31 cm.  Abdomen is soft and  generally nontender except in the left lower groin area with mild  tenderness.  There is no rebound, guarding, or other pain.  ELECTRONIC FETAL MONITORING:  Reveals a reactive fetal heart rate tracing  with no decelerations.  There was some mild irritability initially, now  resolved.  PELVIC:  Exam in the office by Vance Gather Duplantis was negative fern, negative  nitrazine, negative pooling.  Cervix was closed and long.  LABORATORY DATA:  CBC today is hemoglobin 10.1, hematocrit of 28.8, white  blood cell count of 10.3, platelets of 217.  The differential is pending.  BACK:  There is bilateral CVA tenderness noted.  EXTREMITIES:  Deep tendon reflexes are 2+ without clonus.  There is a trace  edema noted.   IMPRESSION:  1. Intrauterine pregnancy at 30 and six-sevenths weeks.  2. Probable kidney stone or stones.   PLAN:  1. Admit for 23-hour observation per consult with Dr. Stefano Gaul as attending     physician.  2. IV of LR at 250 mL/hour.  3. Dilaudid PCA secondary to the patient having a shaking reactive to     morphine in the past.  4. Septra Double-Strength one p.o. b.i.d.  5. Strain all urine.  6. Medications:    a. Pulmicort two puffs b.i.d..     b. Zoloft  175 mg one p.o. q.a.m.     c. Rhinocort AQ one spray each nostril daily.     d. Zyrtec 10 mg one p.o. q.h.s.  e. Prevacid 30 mg one p.o. daily.     f. Ambien 10 mg one p.o. q.h.s.  7. Dr.  Stefano Gaul will consult with Dr. Brunilda Payor regarding further plans.     Renaldo Reel Emilee Hero, C.N.M.                   Janine Limbo, M.D.    VLL/MEDQ  D:  11/01/2003  T:  11/01/2003  Job:  [id number]

## 2011-02-19 NOTE — Consult Note (Signed)
[name], PERRI                      ACCOUNT NO.:  [id number]   MEDICAL RECORD NO.:  [id number]                   PATIENT TYPE:  INP   LOCATION:  9148                                 FACILITY:  [facility]   PHYSICIAN:  Lindaann Slough, M.D.               DATE OF BIRTH:  Mar 06, 1976   DATE OF CONSULTATION:  11/06/2003  DATE OF DISCHARGE:                                   CONSULTATION   REASON FOR CONSULTATION:  Abdominal pain.   REQUESTING PHYSICIAN:  Janine Limbo, M.D.   The patient is a 35 year old female [redacted] weeks pregnant who has a long history  of kidney stone.  She was admitted on November 01, 2003 with abdominal and  back pain.  She also had hematuria.  She passed a kidney stone 2 days ago  but she continues to complain of pain and she had, again, gross hematuria  this morning.  Renal ultrasound showed an 8 mm calculus in the mid pole of  the right kidney and two calculi in the mid pole and lower pole of the left  kidney.  There is no evidence of hydronephrosis.   I have discussed with the patient and her husband treatment options:  1. Continue IV analgesics until she passes the stone or becomes     asymptomatic.  2. Ureteral stents.  I have told her that if we have to do stents we may     have to put stents on both sides since the pain is not localized.   The patient and her husband would prefer conservative management in view of  her pregnancy at this time and I agree that at this time it is a better  option.  We will continue to follow her up and if the pain becomes  intractable we will then insert double J catheters.                                               Lindaann Slough, M.D.    MN/MEDQ  D:  11/06/2003  T:  11/06/2003  Job:  [id number]

## 2011-02-19 NOTE — Discharge Summary (Signed)
[name], [name] NO.:  [id number]   MEDICAL RECORD NO.:  [id number]                   PATIENT TYPE:  INP   LOCATION:  9118                                 [facility]   PHYSICIAN:  Crist Fat. Rivard, M.D.              DATE OF BIRTH:  Jun 19, 1976   DATE OF ADMISSION:  11/22/2003  DATE OF DISCHARGE:  12/06/2003                                 DISCHARGE SUMMARY   ADMISSION DIAGNOSES:  1. Intrauterine pregnancy at 85 and five-sevenths weeks.  2. Urolithiasis.  3. Asthma.  4. Gastroesophageal reflux disease.  5. Obsessive-compulsive disorder/depression.   DISCHARGE DIAGNOSES:  1. Intrauterine pregnancy at 35 and five-sevenths weeks.  2. Urolithiasis, possibly resolved, status post passage of three stones per     the patient's report.  3. Asthma.  4. Gastroesophageal reflux disease.  5. Obsessive-compulsive disorder/depression.  6. Pain management issues.   HOSPITAL PROCEDURES:  1. Nonstress tests.  2. PCA morphine.   HOSPITAL COURSE:  The patient was readmitted for pain management with  recurrent stone pain following discharge from the hospital on November 21, 2003.  She passed one stone on November 04, 2003 but had recurrent pain from  the other stones which remained.  She was admitted and placed on PCA  morphine for analgesia.  While she was in the hospital she met with the case  managers and neonatology regarding plans for delivery and care of herself  and the baby following prolonged narcotic usage.  She also met with the  nutritionist to coordinate her diet issues. The pharmacist was consulted on  December 04, 2003 and recommendations were made to convert to p.o. morphine  immediate release or morphine IR to assist in weaning morphine and this was  begun while in the hospital. She did well with her conversion.  On December 05, 2003 the patient reported passing three stones while she was downstairs and  reported needing less and less analgesia  and on December 06, 2003 she was deemed  to have received the full benefit of her hospital stay and was discharged  home with a prescription for her p.o. morphine.   DISCHARGE LABORATORY DATA:  White blood cell count 11.7, hemoglobin 10.6,  platelets 291.  Urine within normal limits except for ketones upon  admission.   DISCHARGE MEDICATIONS:  1. Morphine IR 30 mg p.o. q.6h. x2 days, then q.8h. x2 days, then q.12h. x1     day, and then p.r.n. #40 with no refills prescription given.  2. She was also given a new prescription for her Zyrtec.   DISCHARGE INSTRUCTIONS:  As per routine and continue discharge instructions  as per Dr. Brunilda Payor for urolithiasis.   DISCHARGE FOLLOW-UP:  Early next week with Dr. Pennie Rushing and then on December 17, 2003 for a scheduled C-section, or p.r.n. as indicated.     Marie L. Williams, C.N.M.  Crist Fat Rivard, M.D.    MLW/MEDQ  D:  12/06/2003  T:  12/07/2003  Job:  [id number]

## 2011-02-19 NOTE — Op Note (Signed)
NAME:  [name], [name]                    ACCOUNT NO.:  [id number]   MEDICAL RECORD NO.:  [id number]                   PATIENT TYPE:  INP   LOCATION:  9139                                 FACILITY:  [facility]   PHYSICIAN:  Hal Morales, M.D.             DATE OF BIRTH:  09-26-1976   DATE OF PROCEDURE:  12/17/2003  DATE OF DISCHARGE:                                 OPERATIVE REPORT   PREOPERATIVE DIAGNOSES:  1. Intrauterine pregnancy at 37 weeks.  2. Prior cesarean section.  Desire for repeat cesarean section.  3. History of morphine habituation during renal calculus management.   POSTOPERATIVE DIAGNOSES:  1. Intrauterine pregnancy at 37 weeks.  2. Prior cesarean section.  Desire for repeat cesarean section.  3. History of morphine habituation during renal calculus management.   OPERATION:  Repeat low transverse cesarean section.   SURGEON:  Dierdre Forth, M.D.   FIRST ASSISTANT:  Vance Gather Duplantis, C.N.M.   ANESTHESIA:  Spinal.   ESTIMATED BLOOD LOSS:  Less than 750 mL.   COMPLICATIONS:  None.   FINDINGS:  Uterus, tubes, and ovaries were normal for the gravid state.  The  patient was delivered of a female infant, whose name is [name], weighing 7  pounds 14 ounces with Apgars of 8 and 8 at 1 and 5 minutes, respectively.  The placenta contained an eccentrically inserted three-vessel cord.   DESCRIPTION OF PROCEDURE:  The patient was taken to the operating room after  appropriate identification and placed on the operating table.  After  placement of a spinal anesthetic, she was placed in the supine position with  a left lateral tilt.  The abdomen and perineum were prepped with multiple  layers of Betadine and a Foley catheter inserted into the bladder and  connected to straight drainage.  The abdomen was draped as a sterile field.  The suprapubic incision site was infiltrated with a total of 10 mL of 0.25%  Marcaine.  A suprapubic incision was made at the site of the  previous  cesarean section incision and the abdomen opened in layers.  The peritoneum  was entered and the bladder blade placed.  The uterus was incised  approximately 2 cm above the uterovesical fold and taken laterally on either  side bluntly.  The infant was delivered from the occiput transverse position  with the aid of a Kiwi vacuum extractor and after having the nares and  pharynx suctioned and the cord clamped and cut, was handed off to the  awaiting pediatricians.  The appropriate cord blood was drawn and the  placenta noted to have separated from the uterus and was removed from the  operative field.  The uterine incision was closed with a running  interlocking suture of 0 Vicryl.  An imbricating suture of 0 Vicryl was  placed.  Hemostasis was noted to be adequate.  Irrigation was carried out.  The abdominal peritoneum was then closed with a running suture of  2-0  Vicryl.  The rectus muscles were reapproximated in the midline with a figure-  of-eight suture of 2-0 Vicryl.  The rectus fascia was closed with a running  suture of 0 Vicryl, then reinforced on either side of midline with figure-of-  eight sutures of 0 Vicryl.  The subcutaneous tissue was irrigated and made  hemostatic with Bovie cautery.  The skin incision was closed with a  subcuticular suture of 3-0 Monocryl.  Sterile dressings were applied after  Steri-Strips had been applied.  The patient was taken from the operating  room to the recovery room in satisfactory condition having tolerated the  procedure well with sponge and instrument counts correct.  The infant went  to the full-term nursery.                                               Hal Morales, M.D.    VPH/MEDQ  D:  12/17/2003  T:  12/17/2003  Job:  [id number]

## 2011-02-19 NOTE — Discharge Summary (Signed)
NAMECHARNEL, GILES NO.:  [id number]   MEDICAL RECORD NO.:  [id number]                   PATIENT TYPE:  INP   LOCATION:  0476                                 FACILITY:  [facility]   PHYSICIAN:  Marcene Duos, M.D.         DATE OF BIRTH:  [birth year]   DATE OF ADMISSION:  01/25/2003  DATE OF DISCHARGE:  01/27/2003                                 DISCHARGE SUMMARY   ADMISSION DIAGNOSES:  1. Right ureteral calculus.  2. Asthma and allergies.  3. Obsessive-compulsive disorder.   DISCHARGE DIAGNOSES:  1. Right ureteral calculus.  2. Asthma and allergies.  3. Obsessive-compulsive disorder.  4. Urinary tract infection, enterococcus.  5. Elevated liver enzymes.   CONSULTATIONS:  Urology, Dr. Brunilda Payor, January 26, 2003.   HISTORY OF PRESENT ILLNESS:  This is a 35 year old female with a history of  recurrent calcium oxalate stones, presented with a six-day history of right  flank dull ache to our office to Dr. Armstead Peaks.  She was given pain  medications and sent for CT but developed increased pain with vomiting on  the way to [facility] and went to the emergency room instead.  Spiral CT in the emergency room showed mild right hydronephrosis with stone,  4 mm, at ureterovesical junction.  UA also showed 1+ leukocytes, 1+ protein,  and 2+ blood.  The patient received multiple pain medications initially  without significant improvement.  Finally urinated, with significant  improvement but uncertain if the accumulation of pain medication knocked out  the pain or if she passed a stone.  Her urine had not been strained.  She  denied any fever, burning on urination.  Her white blood cell count at the  office was normal.   HOSPITAL COURSE:  #1 - RIGHT URETERAL CALCULUS:  It was determined that the  patient should be admitted for observation with her significant pain and  with the concern that she actually had not passed a stone but the pain  medications were finally kicking in.  The following morning she was having  recurrent right flank abdominal pain, and Dr. Brunilda Payor was subsequently  consulted.  He agreed with straining all the urine.  Urine culture was  growing enterococcus, and he recommended starting antibiotics.  The patient  was initiated on Levaquin 500 mg p.o. daily on January 26, 2003.  On January 27, 2003, she was still complaining of the right flank right lower quadrant  pain.  KUB showed question of two calcifications in the course of the right  ureter, and it was elected to go ahead with cystoscopy, right retrograde  pyelogram, ureteroscopy, and the stone was extracted with insertion of a  double-J catheter under general anesthesia.  Again, the stone was found in  the distal right ureter, and the patient tolerated the procedure well.  She  was doing well subsequent to the procedure and wanted to go home and was  subcutaneous discharged later  that day.   #2 - URINARY TRACT INFECTION:  Enterococcus specimen, sensitive to Levaquin,  and she was to continue that orally on a daily basis for 10 days.   #3 - ELEVATED LIVER ENZYMES:  This was noted on a CMET on January 26, 2003.  AST was 423, ALT was 468.  This dropped to 168 and 294 respectively the  following day, and the patient was to follow up with Dr. Delrae Alfred per Dr.  Yetta Barre in one week.   #4 - ASTHMA AND ALLERGIES:  Stable throughout hospitalization.   #5 - OBSESSIVE-COMPULSIVE DISORDER:  Continued on her usual medications  __________.   DISCHARGE MEDICATIONS:  The patient was thus discharged on the following  medications.  1. Levaquin 500 mg p.o. daily for a 10-day course as per Dr. Yetta Barre, who is     the discharging doctor.  2. Zoloft 100 mg daily.  3. Pulmicort 2 puffs b.i.d.  4. Afrin 2 sprays each nostril daily.  5. Nasacort AQ 2 sprays each nostril.  6. Albuterol inhaler as needed.  7. Prenatal vitamins.  8. She was also given Percocet and Phenergan per  Dr. Brunilda Payor as needed.   DISCHARGE INSTRUCTIONS:  I encouraged her to drink lots of fluids and to  call if she developed increased pain, fever, or other problems.   FOLLOW-UP:  Follow up with Dr. Delrae Alfred for liver enzymes check.                                               Marcene Duos, M.D.    EMM/MEDQ  D:  03/05/2003  T:  03/05/2003  Job:  [id number]

## 2011-02-19 NOTE — Op Note (Signed)
[name], GEFFRARD NO.:  [id number]   MEDICAL RECORD NO.:  [id number]                   PATIENT TYPE:  INP   LOCATION:  0476                                 FACILITY:  [facility]   PHYSICIAN:  Lindaann Slough, M.D.               DATE OF BIRTH:  Mar 10, 1976   DATE OF PROCEDURE:  01/27/2003  DATE OF DISCHARGE:                                 OPERATIVE REPORT   PREOPERATIVE DIAGNOSIS:  Right ureteral stone.   POSTOPERATIVE DIAGNOSIS:  Right ureteral stone.   PROCEDURE:  Cystoscopy, right retrograde pyelogram, ureteroscopy with stone  extraction and insertion of double J catheter.   SURGEON:  Lindaann Slough, M.D.   ANESTHESIA:  General.   INDICATIONS FOR PROCEDURE:  The patient is a 35 year old female who has a  history of kidney stones. She has been complaining of dull aching pain for  about a week. Three days ago she started having more pain and on January 25, 2003, the pain became severe. She was seen by her family physician. A CT  scan of the abdomen and pelvis showed a 4 mm stone at the right UVJ with  fullness of the right collecting system and bilateral tiny intrarenal  calculi and also a 5 x 6 mm stone at the left UPJ.   She was treated with analgesics; however, the pain became more severe and  she was admitted. She was taking IV pain medication every two hours and  still did not pass the stone. KUB done this morning showed two  calcifications in the bony pelvis and one of which might not be in the  course of the ureter. The stone at the left UPJ is not visible on KUB.   She is scheduled for cystoscopy and stone manipulation.   DESCRIPTION OF PROCEDURE:  Under general anesthesia, the patient was prepped  and draped and placed in the dorsal lithotomy position. A #22 Wappler  cystoscope was inserted in the bladder. The bladder mucosa is normal. There  is no stone or tumor in the bladder. The ureteral orifices are in normal  position and  shape with clear efflux. A Glidewire was then passed through  the cystoscope into the right ureter. The cystoscope was then removed. The  #6.5 French rigid ureteroscope was passed in the bladder and into the  ureter. There is a stone in the distal ureter. A stone basket was passed  through the ureteroscope and the stone was extracted. The ureteroscope was  then reinserted in the bladder and into the ureter and advanced all the way  up into the upper ureter and there is no evidence of other stone in the  ureter. The ureteroscope was then removed. The Glidewire was then back  loaded into the cystoscope and an open end catheter was passed over the  Glidewire and Glidewire was removed. Contrast was then injected through the  open end catheter and there is no evidence  of filling defect in the  collecting system nor in the ureter. A guidewire was then passed through the  open end catheter and the open end catheter was removed. A #6 [place] - 26  double J catheter was passed over the guidewire. Proximal coil of the double  J catheter is in the collecting system. The distal coil is in the bladder.   The bladder was then emptied and the cystoscope and guidewire were removed.   The patient tolerated the procedure well and left the OR in satisfactory  condition to post anesthesia care unit.                                                Lindaann Slough, M.D.    MN/MEDQ  D:  01/27/2003  T:  01/28/2003  Job:  [id number]   cc:   Dr. Valentina Lucks

## 2011-02-19 NOTE — Op Note (Signed)
NAME:  [name], [name]                      ACCOUNT NO.:  [id number]   MEDICAL RECORD NO.:  [id number]                   PATIENT TYPE:  AMB   LOCATION:  DAY                                  FACILITY:  [facility]   PHYSICIAN:  Lindaann Slough, M.D.               DATE OF BIRTH:  [birth year]   DATE OF PROCEDURE:  02/22/2003  DATE OF DISCHARGE:                                 OPERATIVE REPORT   PREOPERATIVE DIAGNOSES:  Left ureteral calculus and bilateral renal calculi.   POSTOPERATIVE DIAGNOSES:  Left ureteral calculus and bilateral renal  calculi.   PROCEDURE:  Cystoscopy, left ureteroscopy with stone extraction.   SURGEON:  Lindaann Slough, M.D.   ANESTHESIA:  General.   INDICATIONS FOR PROCEDURE:  The patient is a 35 year old female who has a  long history of kidney stones. About a month ago, she had a right stone  extraction done. She then had a stone in the left UPJ that was not causing  any obstruction. For the past two weeks, she has been to the emergency room  twice. The first CT scan showed a 5 x 3 mm stone in the left upper ureter.  She went back again to the emergency room this week with severe left lower  quadrant pain with nausea and vomiting. Repeat CT scan showed a 3 x 5 mm  stone at the left distal ureter with moderate obstruction. The patient  continued to complain of pain. She is now scheduled for stone manipulation.   DESCRIPTION OF PROCEDURE:  Under general anesthesia, the patient was prepped  and draped and placed in the dorsal lithotomy position. A #22 Wappler  cystoscope was inserted in the bladder. The bladder mucosa is normal. There  is no stone or tumor in the bladder. The ureteral orifices are in normal  position and shape. A Glidewire was then passed through the cystoscope into  the left ureter. The cystoscope was then removed. A 6.5 French rigid  ureteroscope was then passed in the ureter and a stone was visualized in the  distal ureter. A nitinol  basket was passed through the ureteroscope and the  stone was extracted with the stone basket. The ureteroscope was then  reinserted in the ureter and there was no evidence of ureteral injury and  the ureteroscope was advanced all the way up into the renal pelvis and there  was no evidence of stone in the ureter. The ureteroscope was then removed.  The cystoscope was reinserted in the bladder and a #6 [place] - 24 double J  catheter was passed over a guidewire. The proximal coil of the double J  catheter is in the collecting system. The distal coil is in the bladder.   The bladder was then emptied and the cystoscope and guidewire were removed.   The patient tolerated the procedure well and left the OR in satisfactory  condition to post anesthesia care unit.  Lindaann Slough, M.D.    MN/MEDQ  D:  02/22/2003  T:  02/22/2003  Job:  [id number]   cc:   Thora Lance, M.D.  [address]  [place]  [place] [address]  Fax: [phone]

## 2011-02-19 NOTE — Discharge Summary (Signed)
[name], [name] NO.:  [id number]   MEDICAL RECORD NO.:  [id number]                   PATIENT TYPE:  INP   LOCATION:  9116                                 FACILITY:  [facility]   PHYSICIAN:  Janine Limbo, M.D.            DATE OF BIRTH:  1975-11-17   DATE OF ADMISSION:  11/01/2003  DATE OF DISCHARGE:  11/21/2003                                 DISCHARGE SUMMARY   ADMISSION DIAGNOSES:  1. Intrauterine pregnancy at 30-6/7 weeks.  2. Probable kidney stone.  3. Depression.   DISCHARGE DIAGNOSES:  1. Intrauterine pregnancy at 33-4/7 weeks.  2. Nephrolithiasis.  3. Depression.   HOSPITAL PROCEDURES:  1. IV hydration.  2. Electronic fetal monitoring.  3. IV analgesia (PCA).  4. Ultrasound.   HOSPITAL COURSE:  Patient was admitted with a probable recurrence of kidney  stones of which she was complaining of flank pain.  She has a history of  recurrent kidney stones in the past.  She was not having any contractions on  admission.  She was admitted initially for observation and given Dilaudid  for pain.  A urology consult was made and determination by ultrasound was  made that she did have two left kidney stones within the kidneys.  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  Pain  medicine and IV hydration was continued.  She was followed by Dr. Brunilda Payor while  in the hospital.  Options were reviewed with the patient during her stay  regarding administration of betamethasone to facilitate fetal lung maturity  and perhaps allowing her to be induced to deliver early and she declined  those offers.  She did receive an epidural for pain relief on November 13, 2003 but lateral requested that it be removed.  She switched from Dilaudid  to morphine PCA for pain management which somewhat controlled her pain.  The  baby throughout her stay continued to have reassuring fetal heart rate  tracings and only occasional contractions  were noted.  Throughout her stay  there were multiple discussions regarding management and options with the  patient.  She was offered an NICU tour which she declined.  On November 21, 2003 she voiced an interest in going home and stated that she had not used  her PCA throughout the night and she felt like the Percocet was handling her  pain and she was requesting discharge home.  Her baseline morphine drip was  turned off to assess pain relief and she was later discharged home from  hospital by Dr. Pennie Rushing.  Her discharge medications are Percocet 5/325 one  to two p.o. q.4h. p.r.n. pain and the patient will also continue all  previous medications at home.  Discharge labs most recent of which was  November 13, 2003 white blood cell count  13.6, hemoglobin 10.2, platelet count 189.  Most recent urine culture was  negative on November 06, 2003.  Discharge instructions  to include routine  antenatal care with the addition of increased hydration at home and  straining of urine.  Discharge followup to occur in 1 week at [facility] or p.r.n.     Marie L. Williams, C.N.M.                 Janine Limbo, M.D.    MLW/MEDQ  D:  11/21/2003  T:  11/22/2003  Job:  [id number]

## 2011-02-19 NOTE — Discharge Summary (Signed)
NAME:  [name], [name]                    ACCOUNT NO.:  [id number]   MEDICAL RECORD NO.:  [id number]                   PATIENT TYPE:  INP   LOCATION:  9139                                 FACILITY:  [facility]   PHYSICIAN:  Janine Limbo, M.D.            DATE OF BIRTH:  1976-07-18   DATE OF ADMISSION:  12/17/2003  DATE OF DISCHARGE:  12/20/2003                                 DISCHARGE SUMMARY   ADMISSION DIAGNOSES:  1. Intrauterine pregnancy at term.  2. Prior cesarean section desiring a repeat.  3. History of morphine habituation for kidney stones.   DISCHARGE DIAGNOSES:  1. Intrauterine pregnancy at term.  2. Prior cesarean section desiring a repeat.  3. History of morphine habituation for kidney stones.  4. Status post repeat low transverse cesarean section.  5. Desiring oral contraceptive pills for contraception.  6. Bottle feeding.   PROCEDURES THIS ADMISSION:  Repeat low transverse cesarean section for  delivery of a viable female named [name] who weighed 7 pounds 14 ounces and  had Apgars of 8 and 8 on December 17, 2003; attended in delivery by Dr. Dierdre Forth and Saverio Danker, C.N.M.   HOSPITAL COURSE:  Mrs. Rasberry is a 35 year old married white female  gravida 2 para 1-0-0-1 at term admitted for repeat cesarean section  secondary to history of previous cesarean section and history of morphine  habituation for kidney stones throughout this pregnancy.  She underwent  cesarean section without difficulty for delivery of a viable female infant  named [name] who weighed 7 pounds 14 ounces and had Apgars of 8 and 8 on  December 17, 2003 attended by Dr. Dierdre Forth and Vance Gather Duplantis, C.N.M.  Please see operative note for further details.  Postoperatively the patient  did well.  She is ambulating, voiding, and eating without difficulty.  Her  vital signs are stable and she has been afebrile throughout her  postoperative course.  She is now bottle feeding without  difficulty.  She  would like to start on Yasmin for contraception.  She is deemed ready for  discharge today.   DISCHARGE INSTRUCTIONS:  As per the [facility] OB[facility]N handout.   DISCHARGE MEDICATIONS:  1. Motrin 600 mg p.o. q.6h. p.r.n. for pain.  2. Percocet one to two p.o. q.4-6h. p.r.n. for pain.  3. Prenatal vitamins daily.   DISCHARGE LABORATORY DATA:  She did receive RhoGAM.  Her hemoglobin is 8.0,  wbc count is 11.5, and platelets are 172.   DISCHARGE FOLLOW-UP:  At [facility] OB/GYN in 4-6 weeks or p.r.n.     Concha Pyo. Duplantis, C.N.M.              Janine Limbo, M.D.    SJD/MEDQ  D:  12/20/2003  T:  12/21/2003  Job:  [id number]

## 2011-02-19 NOTE — Consult Note (Signed)
[name], [name] NO.:  [id number]   MEDICAL RECORD NO.:  [id number]                   PATIENT TYPE:  INP   LOCATION:  0478                                 FACILITY:  [facility] [place]   PHYSICIAN:  Lindaann Slough, M.D.               DATE OF BIRTH:  1976/01/22   DATE OF CONSULTATION:  01/26/2003  DATE OF DISCHARGE:                                   CONSULTATION   UROLOGY CONSULTATION:   REASON FOR CONSULTATION:  Abdominal pain and kidney stone.   HISTORY OF PRESENT ILLNESS:  The patient is a 35 year old female who has a  history of kidney stones.  She has had several stones in the past.  She had  lithotripsy twice and had stone manipulation done three times.  She knows  that the stone is calcium oxalate.  She had been having dull aching pain for  about a week.  Then 3 days ago she had more pain and yesterday morning the  pain was severe.  She was then seen by her family physician who requested a  CAT scan of the abdomen and pelvis.  It showed bilateral tiny intrarenal  calculi and a 5-6 mm stone at the left UPJ with mild obstruction and a 4 mm  stone at the right UVJ with fullness of the right collecting system.  She  was admitted last night and is still complaining of pain.  She has not  passed the stone.   PAST MEDICAL HISTORY:  She has a history of asthma and has a history of  allergies.   PAST SURGICAL HISTORY:  She is status post cholecystectomy.   SOCIAL HISTORY:  She is married and has one child.   PHYSICAL EXAMINATION:  She has right flank and right lower quadrant  tenderness and she has mild right CVA tenderness and her bladder is not  distended.   LABORATORY DATA:  Urinalysis shows 21-50 rbc's, 3-6 wbc's, and many  bacteria.   BUN is 5, creatinine 0.6.  Hemoglobin 10.9, hematocrit 31.9, and WBC 4.4.   IMPRESSION:  1. Right ureterovesical junction stone.  2. Left ureteropelvic junction stone.  3. Bilateral renal calculi.  4. Urinary  tract infection.   SUGGESTIONS:  Strain all urine.  Check urine culture and sensitivity.  Start  Levaquin.  If she remains symptomatic and does not pass the stone, she will  need stone manipulation.   This has been discussed with the patient and her husband.  They understand  and are agreeable.                                              Lindaann Slough, M.D.     MN/MEDQ  D:  01/26/2003  T:  01/26/2003  Job:  [id number]   cc:   Dr. Valentina Lucks

## 2011-02-19 NOTE — H&P (Signed)
[name], [name]   MEDICAL RECORD NO.:  [id number]                   PATIENT TYPE:  INP   LOCATION:  0478                                 FACILITY:  [facility]   PHYSICIAN:  Marcene Duos, M.D.         DATE OF BIRTH:  03-03-1976   DATE OF ADMISSION:  01/25/2003  DATE OF DISCHARGE:                                HISTORY & PHYSICAL   CHIEF COMPLAINT:  Left flank and groin pain with hematuria.   HISTORY OF PRESENT ILLNESS:  This is a 35 year old female with a history of  recurrent calcium oxylate stones with a six day history of dull right flank  pain followed by acute onset of severe right groin pain beginning 0200 this  morning.  The patient also subsequent noted gross hematuria.  Patient denies  fever or dysuria though she has problems with urinary retention and that is  common for her when this occurs.  The patient was seen by  Dr. Armstead Peaks  in our clinic, was given Percocet and sent to [facility] for a spiral CT  scan but apparently developed increased pain and vomiting on the way to  [facility] and came to the emergency room instead.  Reportedly her  white blood cell count was a bit on the low side but I am not aware of any  left shift.  I do not have that available to me right now.  Urinalysis  showed 1+ leukocytes, 1+ protein and 2+ blood.  Spiral CT in the emergency  room showed a mild right hydronephrosis with a 4 mm stone at the  ureterovesical junction.  The patient received a large amount and multiple  pain medications in the emergency room without significant  improvement and  it was elected to admit her.  Interestingly just before she was being moved  to the floor she was able to void a large amount and felt significantly  better as if she had passed the stone at least into her bladder but  subsequent developed a bit more discomfort.  Currently she is pain free.  The patient did not strain her urine  for this.   PAST MEDICAL HISTORY:  1. Asthma.  2. Allergies.  3. Recent outpatient pneumonia. Said she was treated with prednisone and     Biaxin.  4. Obsessive compulsive disorder for which she see Leona Singleton.   PAST SURGICAL HISTORY:  1. Patient is status post cholecystectomy.  2. Status post C-section.  3. Status post right toe surgery.   MEDICATIONS:  1. Zoloft 100 daily.  2. Prenatal vitamins.  3. Pulmicort 2 puffs b.i.d.  4. Astelin two sprays each nostril b.i.d.  5. Nasacort AQ two sprays each nostril.  6. __________ inhaler as needed.   ALLERGIES:  1. PENICILLIN which causes anaphylaxis.  2. MORPHINE which caused itching in the emergency room.  She required     Benadryl.  She states she has  had that reaction in the past.   REVIEW OF SYSTEMS:  URINARY:  Patient states she is probably passed 50  stones since the initial episode three years ago that she was just taking  care of at home with oral pain medication and oral hydration.   FAMILY HISTORY:  Father age 9 healthy.  Mother age 27 with anemia.  No  siblings.  She has one child who is healthy.  Paternal grandparents both  with kidney stones.   PHYSICAL EXAMINATION:  VITAL SIGNS:  Temp is 97.2, blood pressure 123/71,  pulse 83, respirations 20.  GENERAL:  Patient is in no acute distress with good color.  HEENT:  Pupils equal, round, reactive to light.  Extraocular movements  intact.  NECK:  Supple without adenopathy.  CHEST:  Clear.  CARDIOVASCULAR:  Regular, rate and rhythm with normal  S1 and S2.  No S3,  S4, murmur appreciated.  Carotid, radial, femoral, DP, PT pulses _________ .  ABDOMEN:  Soft, nontender.  No organomegaly or masses appreciated.  No flank  pain.  EXTREMITIES:  Without edema.  NEUROLOGIC:  Alert and oriented  x 3.  Cranial nerves II-XII grossly intact.  Deep tendon reflexes 2+/4 throughout.  Motor and sensory are grossly normal.   ASSESSMENT/PLAN:  1. Right ureteral calculus  possibly already passed, though the concern is     that she has finally just received pain relief with her multiple pain     medications.  Seemed to respond best to Dilaudid and we will continue     that.  I am going to check urine tonight again and also send it for     culture.  To strain her urine and will also continue normal saline at 160     cc/hr.  I discussed with the patient calcium and vitamin B     supplementation with Citracal rather then other medicines as to change     her pH in her urine and hopefully avoid future stones.  Once she is done     having a family to consider starting  hydrochlorothiazide which is group-     D, category D for pregnancy.  Hopefully she will not require much pain     medication overnight and if she is doing well tomorrow we can let her go     home.  She already has a prescription for Percocet.  2. Asthma and allergies.  Continue usual medications.  These have been well     controlled recently.  3. Obsessive compulsive disorder.  Continue Zoloft.                                               Marcene Duos, M.D.    EMM/MEDQ  D:  01/25/2003  T:  01/26/2003  Job:  [id number]

## 2011-02-19 NOTE — H&P (Signed)
NAMEMarland Kitchen  TIAMARIE, FURNARI NO.:  [id number]   MEDICAL RECORD NO.:  [id number]                   PATIENT TYPE:  MAT   LOCATION:  MATC                                 FACILITY:  [facility]   PHYSICIAN:  Crist Fat. Rivard, M.D.              DATE OF BIRTH:  1976/02/10   DATE OF ADMISSION:  11/22/2003  DATE OF DISCHARGE:                                HISTORY & PHYSICAL   HISTORY OF PRESENT ILLNESS:  Ms. Bradt is a 35 year old gravida 2 para 1-  0-0-1 at 61 and five-sevenths weeks who presents with recurrence of kidney  stone pain.  The patient's history was remarkable for being admitted to the  hospital on November 01, 2003 with kidney stones.  She was maintained in the  hospital until November 21, 2003.  During that time she passed one kidney  stone   Dictation ended at this point.     Renaldo Reel Emilee Hero, C.N.M.                   Crist Fat Rivard, M.D.    Leeanne Mannan  D:  11/22/2003  T:  11/22/2003  Job:  [id number]

## 2011-07-16 LAB — POCT PREGNANCY, URINE: Preg Test, Ur: NEGATIVE

## 2011-07-16 LAB — BASIC METABOLIC PANEL
CO2: 23
Chloride: 100
Creatinine, Ser: 0.99
GFR calc Af Amer: >60
Potassium: 3.8

## 2011-07-16 LAB — URINALYSIS, ROUTINE W REFLEX MICROSCOPIC
Bilirubin Urine: NEGATIVE
Glucose, UA: NEGATIVE
Ketones, ur: NEGATIVE
Nitrite: NEGATIVE
Specific Gravity, Urine: 1.011
Specific Gravity, Urine: 1.02
Urobilinogen, UA: 0.2
pH: 6
pH: 6.5

## 2011-07-16 LAB — DIFFERENTIAL
Basophils Relative: 0
Eosinophils Absolute: 0.1
Eosinophils Relative: 1
Lymphs Abs: 1.7
Monocytes Absolute: 1 — ABNORMAL HIGH
Monocytes Relative: 10
Neutrophils Relative %: 72

## 2011-07-16 LAB — URINE MICROSCOPIC-ADD ON

## 2011-07-16 LAB — CBC
HCT: 31.7 — ABNORMAL LOW
MCHC: 33.2
MCV: 86.7
RBC: 3.66 — ABNORMAL LOW

## 2011-07-16 LAB — PREGNANCY, URINE: Preg Test, Ur: NEGATIVE

## 2012-06-14 ENCOUNTER — Emergency Department (HOSPITAL_COMMUNITY)
Admission: EM | Admit: 2012-06-14 | Discharge: 2012-06-14 | Disposition: A | Discharge status: Other Acute Inpatient Hospital | Payer: BC Managed Care – PPO | Source: Home / Self Care

## 2012-06-14 ENCOUNTER — Encounter (HOSPITAL_COMMUNITY): Payer: Self-pay | Admitting: Emergency Medicine

## 2012-06-14 ENCOUNTER — Encounter (HOSPITAL_COMMUNITY): Payer: Self-pay

## 2012-06-14 ENCOUNTER — Other Ambulatory Visit: Payer: Self-pay | Admitting: Internal Medicine

## 2012-06-14 ENCOUNTER — Emergency Department (INDEPENDENT_AMBULATORY_CARE_PROVIDER_SITE_OTHER): Payer: BC Managed Care – PPO

## 2012-06-14 ENCOUNTER — Emergency Department (HOSPITAL_COMMUNITY)
Admission: EM | Admit: 2012-06-14 | Discharge: 2012-06-15 | Disposition: A | Discharge status: Home / Self Care | Payer: BC Managed Care – PPO | Attending: Emergency Medicine | Admitting: Emergency Medicine

## 2012-06-14 DIAGNOSIS — R51 Headache: Secondary | ICD-10-CM | POA: Insufficient documentation

## 2012-06-14 DIAGNOSIS — R413 Other amnesia: Secondary | ICD-10-CM | POA: Insufficient documentation

## 2012-06-14 DIAGNOSIS — R0602 Shortness of breath: Secondary | ICD-10-CM | POA: Insufficient documentation

## 2012-06-14 DIAGNOSIS — J45909 Unspecified asthma, uncomplicated: Secondary | ICD-10-CM | POA: Insufficient documentation

## 2012-06-14 DIAGNOSIS — R0600 Dyspnea, unspecified: Secondary | ICD-10-CM

## 2012-06-14 DIAGNOSIS — J45901 Unspecified asthma with (acute) exacerbation: Secondary | ICD-10-CM

## 2012-06-14 DIAGNOSIS — R0609 Other forms of dyspnea: Secondary | ICD-10-CM

## 2012-06-14 HISTORY — DX: Unspecified osteoarthritis, unspecified site: M19.90

## 2012-06-14 HISTORY — DX: Calculus of kidney: N20.0

## 2012-06-14 HISTORY — DX: Unspecified asthma, uncomplicated: J45.909

## 2012-06-14 HISTORY — DX: Migraine, unspecified, not intractable, without status migrainosus: G43.909

## 2012-06-14 NOTE — ED Provider Notes (Signed)
History     CSN: [id number]  Arrival date & time 06/14/12  1817   None     Chief Complaint  Patient presents with  . Shortness of Breath    (Consider location/radiation/quality/duration/timing/severity/associated sxs/prior treatment) Patient is a 36 y.o. female presenting with shortness of breath. The history is provided by the patient and the spouse (Dr. Willa Rough). No language interpreter was used.  Shortness of Breath  The current episode started more than 1 week ago. The problem has been gradually worsening. The problem is severe. The symptoms are relieved by one or more prescription drugs and beta-agonist inhalers. Associated symptoms include shortness of breath and wheezing. There was no intake of a foreign body. Her past medical history is significant for asthma.  Pt went to Dr. Esperanza Richters office and was given prednisone and albuterol and xopenex nebs.  Pt's husband reported to Dr. Willa Rough that pt had a  Memory loss today.  Pt reports she does not recall the am.  Pt reports she took her child to school but does not remember.  The first thing pt remembers today is Dr. Esperanza Richters office.  (Pt is reported to have had another doctors appointment per husband but she does not recall  Past Medical History  Diagnosis Date  . Asthma     Past Surgical History  Procedure Date  . Laser ablation of the cervix     History reviewed. No pertinent family history.  History  Substance Use Topics  . Smoking status: Never Smoker   . Smokeless tobacco: Not on file  . Alcohol Use: Yes    OB History    Grav Para Term Preterm Abortions TAB SAB Ect Mult Living                  Review of Systems  Respiratory: Positive for shortness of breath and wheezing.   All other systems reviewed and are negative.    Allergies  Albuterol and Penicillins  Home Medications   Current Outpatient Rx  Name Route Sig Dispense Refill  . BUDESONIDE 0.5 MG/2ML IN SUSP Nebulization Take 0.5 mg by nebulization 2  (two) times daily.    Marland Kitchen SALINE NASAL SPRAY 0.65 % NA SOLN Nasal Place 1 spray into the nose as needed.      BP 131/85  Pulse 73  Temp 98 F (36.7 C) (Oral)  Resp 18  SpO2 100%  Physical Exam  Nursing note and vitals reviewed. Constitutional: She is oriented to person, place, and time. She appears well-developed and well-nourished.  HENT:  Head: Normocephalic and atraumatic.  Right Ear: External ear normal.  Left Ear: External ear normal.  Nose: Nose normal.  Mouth/Throat: Oropharynx is clear and moist.  Eyes: Conjunctivae normal and EOM are normal. Pupils are equal, round, and reactive to light.  Neck: Normal range of motion. Neck supple.  Cardiovascular: Normal rate and normal heart sounds.   Pulmonary/Chest: Effort normal and breath sounds normal.  Abdominal: Soft.  Musculoskeletal: Normal range of motion.  Neurological: She is alert and oriented to person, place, and time. She has normal reflexes.  Skin: Skin is warm.  Psychiatric: She has a normal mood and affect.    ED Course  Procedures (including critical care time)  Labs Reviewed - No data to display Dg Chest 2 View  06/14/2012  *RADIOLOGY REPORT*  Clinical Data: Cough, wheezing and shortness of breath.  Asthma.  CHEST - 2 VIEW  Comparison: 02/06/2004.  Findings: A small oval nodular density in the  left mid lung zone is unchanged.  A smaller nodular density in the right mid lung zone is also stable.  The interstitial markings are mildly prominent with mild progression.  Normal sized heart.  Mild thoracic spine degenerative changes.  IMPRESSION:  1.  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 2.  Stable bilateral granulomata.   Original Report Authenticated By: Darrol Angel, M.D.      1. Dyspnea   2. Amnesia       MDM   Date: 06/14/2012  Rate: 76  Rhythm: normal sinus rhythm  QRS Axis: normal  Intervals: normal  ST/T Wave abnormalities: normal  Conduction Disutrbances:none  Narrative  Interpretation:   Old EKG Reviewed: none available     Dr. Terrilee Croak, Dr. Willa Rough and I discussed pt.   I will send pt to ED for ct of head and further evaluation.    Lonia Skinner [place], [place] 06/14/12 2102

## 2012-06-14 NOTE — ED Notes (Addendum)
Reports went to allergist at 2pm today because has hx of asthma, reports having SOB, cough, worse with lying down, started a week ago as nasal congestion, sore throat; has been having to use rescue inhaler q4 hours; received breathing tx (albuterol) and prednisone at allergist; reports spo2 at allergist was in upper 80's; received another breathing tx (xopenox), was sent to [facility] to get CXR--pt reports having hemoptysis this AM; reports has been having to sit up lately to sleep, bc unable to lie down d/t cough; pt able to talk in complete sentences, but has been mouth breathing; pt also reports a time period today in the morning, not remembering driving her daughter to school; pt lungs clear

## 2012-06-14 NOTE — ED Notes (Signed)
dysphenia x 7 days, fatigue, mouth breathing; ? Memory issues, chest tightness

## 2012-06-14 NOTE — ED Provider Notes (Signed)
History     CSN: [id number]  Arrival date & time 06/14/12  2053   First MD Initiated Contact with Patient 06/14/12 2351      Chief Complaint  Patient presents with  . Shortness of Breath    HPI Comments: Pt has asthma.  She was trying her medications without relief.    Patient is a 36 y.o. female presenting with shortness of breath. The history is provided by the patient.  Shortness of Breath  The current episode started 3 to 5 days ago. The problem occurs continuously. The problem has been gradually worsening. The problem is moderate. Nothing relieves the symptoms. Nothing aggravates the symptoms. Associated symptoms include chest pressure, cough, shortness of breath and wheezing. Pertinent negatives include no fever. Behavior: Pt does not remember taking her daughter to school today.  No one noticed that she was acting abnormally. She had also texted her husband that she had been to the doctor and had not been there yet. There were no sick contacts.  Pt went to an urgent care this evening.  They sent her to the ED for a head CT.  Past Medical History  Diagnosis Date  . Asthma   . Migraine   . Kidney stone   . Arthritis     Past Surgical History  Procedure Date  . Laser ablation of the cervix   . Cholecystectomy   . Cesarean section     No family history on file.  History  Substance Use Topics  . Smoking status: Never Smoker   . Smokeless tobacco: Not on file  . Alcohol Use: Yes    OB History    Grav Para Term Preterm Abortions TAB SAB Ect Mult Living                  Review of Systems  Constitutional: Negative for fever.  Respiratory: Positive for cough, shortness of breath and wheezing.   Neurological: Positive for headaches (mild).  All other systems reviewed and are negative.    Allergies  Penicillins and Albuterol  Home Medications   Current Outpatient Rx  Name Route Sig Dispense Refill  . BUDESONIDE 0.5 MG/2ML IN SUSP Nebulization Take 0.5 mg by  nebulization 2 (two) times daily.    Marland Kitchen CETIRIZINE HCL 10 MG PO TABS Oral Take 10 mg by mouth daily.    . OMEGA-3 FATTY ACIDS 1000 MG PO CAPS Oral Take 1 g by mouth at bedtime.    . IBUPROFEN 200 MG PO TABS Oral Take 200 mg by mouth every 6 (six) hours as needed. For pain    . MELATONIN PO Oral Take 1 tablet by mouth at bedtime as needed. For sleep    . PRESCRIPTION MEDICATION Nasal Place 2 sprays into the nose 2 (two) times daily. Rhinacort    . PROMETHAZINE-DM 6.25-15 MG/5ML PO SYRP Oral Take 5 mLs by mouth once as needed. For sleep      BP 129/72  Pulse 82  Temp 97.6 F (36.4 C) (Oral)  Resp 15  SpO2 98%  Physical Exam  Nursing note and vitals reviewed. Constitutional: She is oriented to person, place, and time. She appears well-developed and well-nourished. No distress.  HENT:  Head: Normocephalic and atraumatic.  Right Ear: External ear normal.  Left Ear: External ear normal.  Eyes: Conjunctivae normal are normal. Right eye exhibits no discharge. Left eye exhibits no discharge. No scleral icterus.  Neck: Neck supple. No tracheal deviation present.  Cardiovascular: Normal rate, regular rhythm  and intact distal pulses.   Pulmonary/Chest: Effort normal and breath sounds normal. No stridor. No respiratory distress. She has no wheezes. She has no rales.  Abdominal: Soft. Bowel sounds are normal. She exhibits no distension. There is no tenderness. There is no rebound and no guarding.  Musculoskeletal: She exhibits no edema and no tenderness.  Neurological: She is alert and oriented to person, place, and time. She has normal strength. No cranial nerve deficit ( no gross defecits noted) or sensory deficit. She exhibits normal muscle tone. She displays no seizure activity. Coordination normal.  Skin: Skin is warm and dry. No rash noted.  Psychiatric: She has a normal mood and affect.    ED Course  Procedures (including critical care time) Monitor Review NSR, Rate 76, nl p wave and t  waves  Labs Reviewed - No data to display Dg Chest 2 View  06/14/2012  *RADIOLOGY REPORT*  Clinical Data: Cough, wheezing and shortness of breath.  Asthma.  CHEST - 2 VIEW  Comparison: 02/06/2004.  Findings: A small oval nodular density in the left mid lung zone is unchanged.  A smaller nodular density in the right mid lung zone is also stable.  The interstitial markings are mildly prominent with mild progression.  Normal sized heart.  Mild thoracic spine degenerative changes.  IMPRESSION:  1.  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 2.  Stable bilateral granulomata.   Original Report Authenticated By: Darrol Angel, M.D.      1. Asthma attack   2. Amnesia       MDM  Patient is feeling better at this time. I suspect her shortness of breath is related to her asthma exacerbation. She started given steroids earlier and has a prescription at home.  Her CT scan does not show evidence of pulmonary embolism or pneumonia  Regarding her confusion/amnesia earlier in the day. They can may be related to her lack of sleep and fatigue associated with her illness. I doubt stroke. Patient does admit that with her breathing difficulty she has not slept well at all last couple of days. She has no confusion at this time she'll be discharged home and recommend follow up with her doctor to be rechecked.  Regarding her elevated potassium. The specimen was hemolyzed        Celene Kras, MD 06/15/12 [phone]

## 2012-06-15 ENCOUNTER — Emergency Department (HOSPITAL_COMMUNITY): Payer: BC Managed Care – PPO

## 2012-06-15 ENCOUNTER — Encounter (HOSPITAL_COMMUNITY): Payer: Self-pay | Admitting: Radiology

## 2012-06-15 LAB — COMPREHENSIVE METABOLIC PANEL
ALT: 20 U/L (ref 0–35)
Albumin: 4.4 g/dL (ref 3.5–5.2)
Alkaline Phosphatase: 50 U/L (ref 39–117)
Calcium: 9.8 mg/dL (ref 8.4–10.5)
GFR calc Af Amer: >90 mL/min (ref >90)
Glucose, Bld: 119 mg/dL — ABNORMAL HIGH (ref 70–99)
Potassium: 5.9 mEq/L — ABNORMAL HIGH (ref 3.5–5.1)
Sodium: 133 mEq/L — ABNORMAL LOW (ref 135–145)
Total Protein: 8.3 g/dL (ref 6.0–8.3)

## 2012-06-15 LAB — CBC WITH DIFFERENTIAL/PLATELET
Basophils Absolute: 0 10*3/uL (ref 0.0–0.1)
Basophils Relative: 0 % (ref 0–1)
Eosinophils Absolute: 0 10*3/uL (ref 0.0–0.7)
Eosinophils Relative: 0 % (ref 0–5)
MCH: 30.3 pg (ref 26.0–34.0)
MCHC: 34.4 g/dL (ref 30.0–36.0)
MCV: 88 fL (ref 78.0–100.0)
Neutrophils Relative %: 82 % — ABNORMAL HIGH (ref 43–77)
Platelets: 226 10*3/uL (ref 150–400)
RBC: 4.33 MIL/uL (ref 3.87–5.11)
RDW: 12.7 % (ref 11.5–15.5)

## 2012-06-15 LAB — D-DIMER, QUANTITATIVE: D-Dimer, Quant: 0.71 ug/mL-FEU — ABNORMAL HIGH (ref 0.00–0.48)

## 2012-06-15 MED ORDER — SODIUM CHLORIDE 0.9 % IV SOLN
1000.0000 mL | INTRAVENOUS | Status: DC
  Administered 2012-06-15: 1000 mL via INTRAVENOUS

## 2012-06-15 MED ORDER — SODIUM CHLORIDE 0.9 % IV SOLN
1000.0000 mL | Freq: Once | INTRAVENOUS | Status: AC
  Administered 2012-06-15: 1000 mL via INTRAVENOUS

## 2012-06-15 MED ORDER — IOHEXOL 350 MG/ML SOLN
100.0000 mL | Freq: Once | INTRAVENOUS | Status: AC | PRN
  Administered 2012-06-15: 100 mL via INTRAVENOUS

## 2012-06-15 NOTE — ED Notes (Signed)
Pt states understanding of discharge instructions 

## 2012-06-15 NOTE — Discharge Instructions (Signed)
Asthma, Adult  Asthma is a disease of the lungs and can make it hard to breathe. Asthma cannot be cured, but medicine can help control it. Asthma may be started (triggered) by:   Pollen.   Dust.   Animal skin flakes (dander).   Molds.   Foods.   Respiratory infections (colds, flu).   Smoke.   Exercise.   Stress.   Other things that cause allergic reactions or allergies (allergens).  HOME CARE    Talk to your doctor about how to manage your attacks at home. This may include:   Using a tool called a peak flow meter.   Having medicine ready to stop the attack.   Take all medicine as told by your doctor.   Wash bed sheets and blankets every week in hot water and put them in the dryer.   Drink enough fluids to keep your pee (urine) clear or pale yellow.   Always be ready to get emergency help. Write down the phone number for your doctor. Keep it where you can easily find it.   Talk about exercise routines with your doctor.   If animal dander is causing your asthma, you may need to find a new home for your pet(s).  GET HELP RIGHT AWAY IF:    You have muscle aches.   You cough more.   You have chest pain.   You have thick spit (sputum) that changes to yellow, green, gray, or bloody.   Medicine does not stop your wheezing.   You have problems breathing.   You have a fever.   Your medicine causes:   A rash.   Itching.   Puffiness (swelling).   Breathing problems.  MAKE SURE YOU:    Understand these instructions.   Will watch your condition.   Will get help right away if you are not doing well or get worse.  Document Released: 03/08/2008 Document Revised: 09/09/2011 Document Reviewed: 07/31/2008  ExitCare Patient Information 2012 [employer].

## 2012-06-16 NOTE — Progress Notes (Signed)
Results for SHAWNELLE, SPOERL (MRN [id number]) as of 06/16/2012 13:36  Ref. Range 06/15/2012 00:04  Sodium Latest Range: 135-145 mEq/L 133 (L)  Potassium Latest Range: 3.5-5.1 mEq/L 5.9 (H)  Chloride Latest Range: 96-112 mEq/L 100  CO2 Latest Range: 19-32 mEq/L 19  BUN Latest Range: 6-23 mg/dL 12  Creatinine Latest Range: 0.50-1.10 mg/dL 2.13  Calcium Latest Range: 8.4-10.5 mg/dL 9.8  GFR calc non Af Amer Latest Range: >90 mL/min >90  GFR calc Af Amer Latest Range: >90 mL/min >90  Glucose Latest Range: 70-99 mg/dL 086 (H)  Alkaline Phosphatase Latest Range: 39-117 U/L 50  Albumin Latest Range: 3.5-5.2 g/dL 4.4  AST Latest Range: 0-37 U/L 38 (H)  ALT Latest Range: 0-35 U/L 20  Total Protein Latest Range: 6.0-8.3 g/dL 8.3  Total Bilirubin Latest Range: 0.3-1.2 mg/dL 0.7  WBC Latest Range: 4.0-10.5 K/uL 8.2  RBC Latest Range: 3.87-5.11 MIL/uL 4.33  Hemoglobin Latest Range: 12.0-15.0 g/dL 57.8  HCT Latest Range: 36.0-46.0 % 38.1  MCV Latest Range: 78.0-100.0 fL 88.0  MCH Latest Range: 26.0-34.0 pg 30.3  MCHC Latest Range: 30.0-36.0 g/dL 46.9  RDW Latest Range: 11.5-15.5 % 12.7  Platelets Latest Range: 150-400 K/uL 226  Neutrophils Relative Latest Range: 43-77 % 82 (H)  Lymphocytes Relative Latest Range: 12-46 % 14  Monocytes Relative Latest Range: 3-12 % 4  Eosinophils Relative Latest Range: 0-5 % 0  Basophils Relative Latest Range: 0-1 % 0  NEUT# Latest Range: 1.7-7.7 K/uL 6.7  Lymphocytes Absolute Latest Range: 0.7-4.0 K/uL 1.1  Monocytes Absolute Latest Range: 0.1-1.0 K/uL 0.3  Eosinophils Absolute Latest Range: 0.0-0.7 K/uL 0.0  Basophils Absolute Latest Range: 0.0-0.1 K/uL 0.0  D-Dimer, Quant Latest Range: 0.00-0.48 ug/mL-FEU 0.71 (H)

## 2012-06-19 NOTE — ED Provider Notes (Signed)
Medical screening examination/treatment/procedure(s) were performed by non-physician practitioner and as supervising physician I was immediately available for consultation/collaboration.  Luiz Blare MD   Luiz Blare, MD 06/19/12 [phone]

## 2012-06-22 LAB — PULMONARY FUNCTION TEST

## 2012-07-06 ENCOUNTER — Ambulatory Visit (INDEPENDENT_AMBULATORY_CARE_PROVIDER_SITE_OTHER): Payer: BC Managed Care – PPO | Admitting: Internal Medicine

## 2012-07-06 ENCOUNTER — Encounter: Payer: Self-pay | Admitting: Internal Medicine

## 2012-07-06 VITALS — BP 104/68 | HR 88 | Temp 98.0°F | Ht 69.0 in | Wt 225.8 lb

## 2012-07-06 DIAGNOSIS — R0989 Other specified symptoms and signs involving the circulatory and respiratory systems: Secondary | ICD-10-CM

## 2012-07-06 DIAGNOSIS — R0600 Dyspnea, unspecified: Secondary | ICD-10-CM

## 2012-07-06 NOTE — Progress Notes (Signed)
Subjective:    Patient ID: [name], female    DOB: Nov 06, 1975, 36 y.o.   MRN: [id number]  HPI  PCP is Allean Found, MD Allergist - Dr Al Decant   IOV  07/06/2012 36 year old female.  reports that she has never smoked. She does not have any smokeless tobacco history on file.  Known asthma and allergies - sees Dr Willa Rough.  At baseline: has not asthma attack in 10 years. Only prednisone is every other year for sinus infection. Last ER visit for asthma was 10 years ago in [place]. No problems since moving to [place]. In [place] was on repeated prednisone > 10 years ago  Says 3 weeks ago started having flare of sinus that she assumed was due to usual fall season allergies but this did not clear. This then progressed to cough, and dyspnea had one episode mild streaky hemoptysis. This resulted in CXR and resulted in CT angiogram in [facility] ER that ruled out PE. She was then started on prednisone and antibiotics. Has had 2 rounds of each since then. Now off prednisone and antibiotic < 7 days ago. But still feels dyspneic. Feels she has to work hard to breathe. Feels nebs not helping. Not getting better. Dyspnea worse now than a month ago (at baseline no dyspnea). Feels every breath she takes is not a quality breath. Does not know relieving factors.  Exertion makes her slightly more dyspneic but also talking to me makes her feel she is not getting a  Good breath. There is associated mild cough that is resolving well (this time of year has allergy cough and runny nose) and associated mild sinus drainage that is baseline.MEds indicate NSAID but denies symptom correlation with this. Denies associtaed wheeze  There is question of "pulm fibrosis":  On CT sept 2013  but I wonder if is dependent atelctasis or represent chronic untreated gerd microaspiration (no change since 2011 abd ct lung cut). THree is no PE setp 2013 ct. There is a 1.6 cm pre-carinal node however     CT chest 06/15/12 Clinical Data:  Cough. Shortness of breath. Elevated D-dimer.  Findings: Technically adequate study with good opacification of the  central and segmental pulmonary arteries. No focal filling  defects. No evidence of significant pulmonary embolus.  Normal heart size. Normal caliber thoracic aorta. Prominent right  pretracheal lymph node measuring about 1.6 cm short axis dimension.  No axillary lymphadenopathy. No pleural effusions. Visualized  upper abdominal organs are grossly unremarkable.  Slight interstitial pattern to the lungs probably due to fibrosis.  No focal airspace consolidation. No pneumothorax. Airways appear  patent. Calcified granuloma in the left lower lung with calcified  left hilar lymph nodes consist with postinflammatory process.  IMPRESSION:  No evidence of significant pulmonary embolus. Nonspecific enlarged  lymph node in the right pretracheal region. Calcified granuloma  with calcified lymph node on the left.  Original Report Authenticated By: Marlon Pel, M.D.    Spriometry 06/22/12 at Dr Willa Rough office   fev1 3.08L/63%, FVC 3.7L/53%, Rato  99 (121%), small airways - 132%  EXHALED NITRIC OXIDE 12 and LOW prb for asthma v well controlled   Past Medical History  Diagnosis Date  . Asthma   . Migraine   . Kidney stone   . Arthritis      Family History  Problem Relation Age of Onset  . Allergies Father   . Heart disease Mother   . Heart disease Maternal Grandmother   . Heart  disease Paternal Grandmother   . Clotting disorder Mother   . Cancer      grandparents     History   Social History  . Marital Status: Married    Spouse Name: N/A    Number of Children: N/A  . Years of Education: N/A   Occupational History  . homemaker    Social History Main Topics  . Smoking status: Never Smoker   . Smokeless tobacco: Not on file  . Alcohol Use: Yes  . Drug Use: No  . Sexually Active: Yes    Birth Control/ Protection: Surgical   Other Topics Concern  . Not  on file   Social History Narrative  . No narrative on file     Allergies  Allergen Reactions  . Penicillins Anaphylaxis  . Albuterol Other (See Comments)    Jittery      Outpatient Prescriptions Prior to Visit  Medication Sig Dispense Refill  . cetirizine (ZYRTEC) 10 MG tablet Take 10 mg by mouth daily.      . fish oil-omega-3 fatty acids 1000 MG capsule Take 1 g by mouth at bedtime.      Marland Kitchen ibuprofen (ADVIL) 200 MG tablet Take 200 mg by mouth every 6 (six) hours as needed. For pain      . MELATONIN PO Take 1 tablet by mouth at bedtime as needed. For sleep      . promethazine-dextromethorphan (PROMETHAZINE-DM) 6.25-15 MG/5ML syrup Take 5 mLs by mouth once as needed. For sleep      . budesonide (PULMICORT) 0.5 MG/2ML nebulizer solution Take 0.5 mg by nebulization 2 (two) times daily.      Marland Kitchen PRESCRIPTION MEDICATION Place 2 sprays into the nose 2 (two) times daily. Rhinacort          Review of Systems  Constitutional: Negative for fever and unexpected weight change.  HENT: Positive for congestion and sore throat. Negative for ear pain, nosebleeds, rhinorrhea, sneezing, trouble swallowing, dental problem, postnasal drip and sinus pressure.   Eyes: Negative for redness and itching.  Respiratory: Positive for cough and shortness of breath. Negative for chest tightness and wheezing.   Cardiovascular: Negative for palpitations and leg swelling.  Gastrointestinal: Negative for nausea and vomiting.  Genitourinary: Negative for dysuria.  Musculoskeletal: Negative for joint swelling.  Skin: Negative for rash.  Neurological: Positive for headaches.  Hematological: Does not bruise/bleed easily.  Psychiatric/Behavioral: Negative for dysphoric mood. The patient is not nervous/anxious.        Objective:   Physical Exam  Vitals reviewed. Constitutional: She is oriented to person, place, and time. She appears well-developed and well-nourished. No distress.       obese  HENT:  Head:  Normocephalic and atraumatic.  Right Ear: External ear normal.  Left Ear: External ear normal.  Mouth/Throat: Oropharynx is clear and moist. No oropharyngeal exudate.  Eyes: Conjunctivae normal and EOM are normal. Pupils are equal, round, and reactive to light. Right eye exhibits no discharge. Left eye exhibits no discharge. No scleral icterus.  Neck: Normal range of motion. Neck supple. No JVD present. No tracheal deviation present. No thyromegaly present.  Cardiovascular: Normal rate, regular rhythm, normal heart sounds and intact distal pulses.  Exam reveals no gallop and no friction rub.   No murmur heard. Pulmonary/Chest: Effort normal and breath sounds normal. No respiratory distress. She has no wheezes. She has no rales. She exhibits no tenderness.  Abdominal: Soft. Bowel sounds are normal. She exhibits no distension and no mass. There is  no tenderness. There is no rebound and no guarding.  Musculoskeletal: Normal range of motion. She exhibits no edema and no tenderness.  Lymphadenopathy:    She has no cervical adenopathy.  Neurological: She is alert and oriented to person, place, and time. She has normal reflexes. No cranial nerve deficit. She exhibits normal muscle tone. Coordination normal.  Skin: Skin is warm and dry. No rash noted. She is not diaphoretic. No erythema. No pallor.  Psychiatric: She has a normal mood and affect. Her behavior is normal. Judgment and thought content normal.          Assessment & Plan:

## 2012-07-06 NOTE — Patient Instructions (Addendum)
Please have full PFT breathing test Once test done leave message through the office staff to have me review and call you with results and advise next step

## 2012-07-12 ENCOUNTER — Ambulatory Visit (INDEPENDENT_AMBULATORY_CARE_PROVIDER_SITE_OTHER): Payer: BC Managed Care – PPO | Admitting: Internal Medicine

## 2012-07-12 DIAGNOSIS — R0602 Shortness of breath: Secondary | ICD-10-CM

## 2012-07-12 LAB — PULMONARY FUNCTION TEST

## 2012-07-12 NOTE — Progress Notes (Signed)
PFT done today. 

## 2012-07-13 ENCOUNTER — Telehealth: Payer: Self-pay | Admitting: Internal Medicine

## 2012-07-13 DIAGNOSIS — R0600 Dyspnea, unspecified: Secondary | ICD-10-CM

## 2012-07-13 NOTE — Telephone Encounter (Signed)
Please advise Britt Boozer if he has this in his look at thanks

## 2012-07-13 NOTE — Telephone Encounter (Signed)
Bring it to me please

## 2012-07-13 NOTE — Telephone Encounter (Signed)
[facility], once this is scheduled please let us know so we can schedule rov with MR for afterwards, thanks

## 2012-07-13 NOTE — Telephone Encounter (Signed)
Triage:  - please set up CPST and then fu after CPST  - details of my discussion below but is for my perusal  Thanks  MR      Per email from Dr Vivi Martens radiologist 07/06/12 on her CT from  CT chest sept 2013 and ct abd lung cut from 2011  - With the condition that it is a CTA with some respiratory motion, there is minimal dependent opacity, likely atelectasis but certainly can't exclude mild subpleural fibrosis. Prone high-res imaging would be confirmatory.  No progressive findings at lung bases from prior CT AP's.   Have a great week, Melinda   PFTs 10.9.13 PFT data 07/12/2012   Fev1 3L/94%  FVC 3.7L/89%  BD response 380cc/8% fev1  Ratio 97  fef 25-75% 97  TLC 5L/83%  DLCO 22/74%    Discussed with patient  - ILD: unlikely she has ILD but not fully ruled out - Dyspnea: Not explained by PFT and CT and certaoinly if she has ILD not enough to explain dyspnea  She wants to get to bottom. So, CPST ordered and wil see her after that

## 2012-07-13 NOTE — Telephone Encounter (Signed)
Pt is requesting her PFT results. Done 07/12/12. Please advise MR thanks.

## 2012-07-13 NOTE — Telephone Encounter (Signed)
PFT given to MR. Carron Curie, CMA

## 2012-07-14 NOTE — Telephone Encounter (Signed)
She is scheduled 10/17/13211:30am @[facility]  for cpst i lmtcb on her phone Tobe Sos

## 2012-07-14 NOTE — Telephone Encounter (Signed)
Pt aware of CPST date and time. ROV scheduled for 07/28/12. Carron Curie, CMA

## 2012-07-16 DIAGNOSIS — R0600 Dyspnea, unspecified: Secondary | ICD-10-CM | POA: Insufficient documentation

## 2012-07-16 NOTE — Assessment & Plan Note (Signed)
Unclear source of dyspnea. I am not sure what to make of the "pulmonary fibrosis " findings at lung base. It could be dependent atelectasis. I will get full PFT and reassess. I will get 2nd opinion on CT from Dr Vivi Martens chest radiologist. Might have to do CPST or prone image CT or both depending on CT results

## 2012-07-18 ENCOUNTER — Encounter: Payer: Self-pay | Admitting: Internal Medicine

## 2012-07-20 ENCOUNTER — Ambulatory Visit (HOSPITAL_COMMUNITY): Payer: BC Managed Care – PPO | Attending: Internal Medicine

## 2012-07-20 DIAGNOSIS — R0989 Other specified symptoms and signs involving the circulatory and respiratory systems: Secondary | ICD-10-CM | POA: Insufficient documentation

## 2012-07-20 DIAGNOSIS — R0600 Dyspnea, unspecified: Secondary | ICD-10-CM

## 2012-07-20 DIAGNOSIS — R0609 Other forms of dyspnea: Secondary | ICD-10-CM | POA: Insufficient documentation

## 2012-07-20 DIAGNOSIS — R0602 Shortness of breath: Secondary | ICD-10-CM | POA: Insufficient documentation

## 2012-07-25 ENCOUNTER — Encounter: Payer: Self-pay | Admitting: Internal Medicine

## 2012-07-28 ENCOUNTER — Ambulatory Visit (INDEPENDENT_AMBULATORY_CARE_PROVIDER_SITE_OTHER): Payer: BC Managed Care – PPO | Admitting: Internal Medicine

## 2012-07-28 VITALS — BP 112/68 | HR 82 | Ht 69.0 in | Wt 228.8 lb

## 2012-07-28 DIAGNOSIS — J8410 Pulmonary fibrosis, unspecified: Secondary | ICD-10-CM

## 2012-07-28 DIAGNOSIS — J849 Interstitial pulmonary disease, unspecified: Secondary | ICD-10-CM

## 2012-07-28 DIAGNOSIS — R0600 Dyspnea, unspecified: Secondary | ICD-10-CM

## 2012-07-28 DIAGNOSIS — R0989 Other specified symptoms and signs involving the circulatory and respiratory systems: Secondary | ICD-10-CM

## 2012-07-28 NOTE — Patient Instructions (Addendum)
Pulmonary stress test suggests weight likely cause of shortness of breath otherwise normal study Continue aerobic exercises that are non-impact, you can add high intensit interval training regimens < 30 min each session for this to build conditioning In order to sort out if you have pulmonary fibrosis or not  - return in 9 months with CT scan chest high resolution without contrast with prone and supine images

## 2012-07-28 NOTE — Progress Notes (Signed)
Subjective:    Patient ID: [name], female    DOB: Dec 21, 1975, 36 y.o.   MRN: [id number]  HPI PCP is Allean Found, MD Allergist - Dr Al Decant   IOV  07/06/2012 36 year old female.  reports that she has never smoked. She does not have any smokeless tobacco history on file.  Known asthma and allergies - sees Dr Willa Rough.  At baseline: has not asthma attack in 10 years. Only prednisone is every other year for sinus infection. Last ER visit for asthma was 10 years ago in [place]. No problems since moving to [place]. In [place] was on repeated prednisone > 10 years ago  Says 3 weeks ago started having flare of sinus that she assumed was due to usual fall season allergies but this did not clear. This then progressed to cough, and dyspnea had one episode mild streaky hemoptysis. This resulted in CXR and resulted in CT angiogram in [facility] ER that ruled out PE. She was then started on prednisone and antibiotics. Has had 2 rounds of each since then. Now off prednisone and antibiotic < 7 days ago. But still feels dyspneic. Feels she has to work hard to breathe. Feels nebs not helping. Not getting better. Dyspnea worse now than a month ago (at baseline no dyspnea). Feels every breath she takes is not a quality breath. Does not know relieving factors.  Exertion makes her slightly more dyspneic but also talking to me makes her feel she is not getting a  Good breath. There is associated mild cough that is resolving well (this time of year has allergy cough and runny nose) and associated mild sinus drainage that is baseline.MEds indicate NSAID but denies symptom correlation with this. Denies associtaed wheeze  There is question of "pulm fibrosis":  On CT sept 2013  but I wonder if is dependent atelctasis or represent chronic untreated gerd microaspiration (no change since 2011 abd ct lung cut). THree is no PE setp 2013 ct. There is a 1.6 cm pre-carinal node however     CT chest 06/15/12 Clinical Data:  Cough. Shortness of breath. Elevated D-dimer.  Findings: Technically adequate study with good opacification of the  central and segmental pulmonary arteries. No focal filling  defects. No evidence of significant pulmonary embolus.  Normal heart size. Normal caliber thoracic aorta. Prominent right  pretracheal lymph node measuring about 1.6 cm short axis dimension.  No axillary lymphadenopathy. No pleural effusions. Visualized  upper abdominal organs are grossly unremarkable.  Slight interstitial pattern to the lungs probably due to fibrosis.  No focal airspace consolidation. No pneumothorax. Airways appear  patent. Calcified granuloma in the left lower lung with calcified  left hilar lymph nodes consist with postinflammatory process.  IMPRESSION:  No evidence of significant pulmonary embolus. Nonspecific enlarged  lymph node in the right pretracheal region. Calcified granuloma  with calcified lymph node on the left.  Original Report Authenticated By: Marlon Pel, M.D.    Spriometry 06/22/12 at Dr Willa Rough office   fev1 3.08L/63%, FVC 3.7L/53%, Rato  99 (121%), small airways - 132%  EXHALED NITRIC OXIDE 12 and LOW prb for asthma v well controlled   Please have full PFT breathing test  Once test done leave message through the office staff to have me review and call you with results and advise next step   OV 07/28/2012    Here to review results of  CT , PFT and Pulmpnary stress test  - details below. I Took a second  opinion on CT chest from one of our thoracic radiologist   Per email from Dr Vivi Martens radiologist 07/06/12 on her CT from  CT chest sept 2013 and ct abd lung cut from 2011  - With the condition that it is a CTA with some respiratory motion, there is minimal dependent opacity, likely atelectasis but certainly can't exclude mild subpleural fibrosis. Prone high-res imaging would be confirmatory.  No progressive findings at lung bases from prior CT AP's.   Have a great  week, Melinda   PFTs 10.9.13 PFT data 07/12/2012   Fev1 3L/94%  FVC 3.7L/89%  BD response 380cc/8% fev1  Ratio 97  fef 25-75% 97  TLC 5L/83%  DLCO 22/74%     CPST done 07/20/12  - essentially normal test  - suggests obesity as possible contribution to dyspnea  - no evidence of Exercise induced bronchospasm     Review of Systems  Constitutional: Negative for fever and unexpected weight change.  HENT: Negative for ear pain, nosebleeds, congestion, sore throat, rhinorrhea, sneezing, trouble swallowing, dental problem, postnasal drip and sinus pressure.   Eyes: Negative for redness and itching.  Respiratory: Negative for cough, chest tightness, shortness of breath and wheezing.   Cardiovascular: Negative for palpitations and leg swelling.  Gastrointestinal: Negative for nausea and vomiting.  Genitourinary: Negative for dysuria.  Musculoskeletal: Negative for joint swelling.  Skin: Negative for rash.  Neurological: Negative for headaches.  Hematological: Does not bruise/bleed easily.  Psychiatric/Behavioral: Negative for dysphoric mood. The patient is not nervous/anxious.        Objective:   Physical Exam Vitals reviewed. Constitutional: She is oriented to person, place, and time. She appears well-developed and well-nourished. No distress.       obese    Neurological: She is alert and oriented to person, place, and time. She has normal reflexes. No cranial nerve deficit. She exhibits normal muscle tone. Coordination normal.  .  Psychiatric: She has a normal mood and affect. Her behavior is normal. Judgment and thought content normal.     Discussion only visit    Assessment & Plan:

## 2012-07-30 DIAGNOSIS — J849 Interstitial pulmonary disease, unspecified: Secondary | ICD-10-CM | POA: Insufficient documentation

## 2012-07-30 NOTE — Assessment & Plan Note (Addendum)
All investigations so far do not reveal etiology for dyspnea beyond contributiion from her weight to dyspnea. If she has true ILD it is not significant enough to cause dyspnea.  There is no evidence of exercse induced bronchospasm conistent with the low exhaled nitric oxide test that shows asthma is well controlled (she is on meds) v low prob for asthma. I have asked her to continue to focus on weight loss and fitness. Advised her to look into high intensity interval techniques. Will follow again in 9 months   > 50% of this 15 min visit spent in face to face counseling

## 2012-07-30 NOTE — Assessment & Plan Note (Signed)
Not sure she has ILD on Ct chest. 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 To put this issue to rest we will get high res CT chest with prone and supine images in 9 months or so. She clearly wants to ruled out definitely and wants to proceed with CT but willing to wait 9-12 months

## 2012-08-10 DIAGNOSIS — R0602 Shortness of breath: Secondary | ICD-10-CM

## 2012-11-18 ENCOUNTER — Other Ambulatory Visit: Payer: Self-pay

## 2013-01-17 ENCOUNTER — Other Ambulatory Visit: Payer: BC Managed Care – PPO

## 2013-01-24 ENCOUNTER — Ambulatory Visit (INDEPENDENT_AMBULATORY_CARE_PROVIDER_SITE_OTHER)
Admission: RE | Admit: 2013-01-24 | Discharge: 2013-01-24 | Disposition: A | Discharge status: Home / Self Care | Payer: BC Managed Care – PPO | Source: Ambulatory Visit | Attending: Internal Medicine | Admitting: Internal Medicine

## 2013-01-24 DIAGNOSIS — R0609 Other forms of dyspnea: Secondary | ICD-10-CM

## 2013-01-24 DIAGNOSIS — R0600 Dyspnea, unspecified: Secondary | ICD-10-CM

## 2013-01-24 DIAGNOSIS — J8410 Pulmonary fibrosis, unspecified: Secondary | ICD-10-CM

## 2013-01-24 DIAGNOSIS — J849 Interstitial pulmonary disease, unspecified: Secondary | ICD-10-CM

## 2013-01-29 ENCOUNTER — Telehealth: Payer: Self-pay | Admitting: Internal Medicine

## 2013-01-29 NOTE — Telephone Encounter (Signed)
Notes Recorded by Kalman Shan, MD on 01/25/2013 at 12:18 AM Ct is normal. IS he asymptomatic ? She was supposed to come to office to disuss her symptoms and this ct result ---  Pt aware. She is wanting to know if there was any change from last CT. She stated she is having NO symptoms at all. She is also wanting to know if she needs to make an appt. Please advise MR thanks

## 2013-01-30 NOTE — Telephone Encounter (Signed)
LMOMTCB

## 2013-01-30 NOTE — Telephone Encounter (Signed)
Pt returned call & can be reached at [phone].  Antionette Fairy

## 2013-01-30 NOTE — Telephone Encounter (Signed)
Dr. Marchelle Gearing please advise Regarding message thanks

## 2013-01-31 NOTE — Telephone Encounter (Signed)
Returning call.[name] ° °

## 2013-01-31 NOTE — Telephone Encounter (Signed)
IMPRESSION:  1. No active cardiopulmonary abnormalities.  2. No significant change from 06/15/2012.  3. Prior granulomatous disease.  Original Report Authenticated By: Signa Kell, M.D.  Spoke with pt I advised that the ct from 01/30/13 was compared with the scan she had in Sept 2013  Radiologist noted that there was no significant change  She verbalized understanding, and states "feeling wonderful" and she is asking if a f/u is really needed  Please advise thanks!

## 2013-02-01 NOTE — Telephone Encounter (Signed)
If she fis feeling fine and because ct is normal. No need for fu. REturn if there is a problem   Dr. Kalman Shan, M.D., [facility].C.P Pulmonary and Critical Care Medicine Staff Physician [facility] Pager: [phone], If no answer or between  15:00h - 7:00h: call [phone]  02/01/2013 10:12 AM

## 2013-02-01 NOTE — Telephone Encounter (Signed)
I spoke with patient about results and she verbalized understanding and had no questions 

## 2013-04-24 ENCOUNTER — Ambulatory Visit
Admission: RE | Admit: 2013-04-24 | Discharge: 2013-04-24 | Disposition: A | Discharge status: Home / Self Care | Payer: BC Managed Care – PPO | Source: Ambulatory Visit | Attending: Family Medicine | Admitting: Family Medicine

## 2013-04-24 ENCOUNTER — Other Ambulatory Visit: Payer: Self-pay | Admitting: Family Medicine

## 2013-04-24 DIAGNOSIS — S60212A Contusion of left wrist, initial encounter: Secondary | ICD-10-CM

## 2013-08-09 ENCOUNTER — Other Ambulatory Visit: Payer: Self-pay

## 2013-09-19 ENCOUNTER — Other Ambulatory Visit: Payer: Self-pay | Admitting: Neurological Surgery

## 2013-09-19 DIAGNOSIS — R29898 Other symptoms and signs involving the musculoskeletal system: Secondary | ICD-10-CM

## 2013-10-04 HISTORY — PX: BRAIN SURGERY: SHX531

## 2013-10-11 DIAGNOSIS — K219 Gastro-esophageal reflux disease without esophagitis: Secondary | ICD-10-CM | POA: Insufficient documentation

## 2013-10-11 DIAGNOSIS — J45909 Unspecified asthma, uncomplicated: Secondary | ICD-10-CM | POA: Insufficient documentation

## 2013-10-11 DIAGNOSIS — D496 Neoplasm of unspecified behavior of brain: Secondary | ICD-10-CM | POA: Insufficient documentation

## 2013-11-08 DIAGNOSIS — C717 Malignant neoplasm of brain stem: Secondary | ICD-10-CM | POA: Insufficient documentation

## 2013-11-26 DIAGNOSIS — C720 Malignant neoplasm of spinal cord: Secondary | ICD-10-CM | POA: Insufficient documentation

## 2013-12-01 ENCOUNTER — Encounter (HOSPITAL_COMMUNITY): Payer: Self-pay | Admitting: Emergency Medicine

## 2013-12-01 ENCOUNTER — Emergency Department (HOSPITAL_COMMUNITY): Payer: BC Managed Care – PPO

## 2013-12-01 ENCOUNTER — Emergency Department (HOSPITAL_COMMUNITY)
Admission: EM | Admit: 2013-12-01 | Discharge: 2013-12-01 | Disposition: A | Discharge status: Other Acute Inpatient Hospital | Payer: BC Managed Care – PPO | Attending: Emergency Medicine | Admitting: Emergency Medicine

## 2013-12-01 DIAGNOSIS — G919 Hydrocephalus, unspecified: Secondary | ICD-10-CM

## 2013-12-01 DIAGNOSIS — G834 Cauda equina syndrome: Secondary | ICD-10-CM | POA: Insufficient documentation

## 2013-12-01 DIAGNOSIS — J45909 Unspecified asthma, uncomplicated: Secondary | ICD-10-CM | POA: Insufficient documentation

## 2013-12-01 DIAGNOSIS — G911 Obstructive hydrocephalus: Secondary | ICD-10-CM | POA: Insufficient documentation

## 2013-12-01 DIAGNOSIS — Z9089 Acquired absence of other organs: Secondary | ICD-10-CM | POA: Insufficient documentation

## 2013-12-01 DIAGNOSIS — R209 Unspecified disturbances of skin sensation: Secondary | ICD-10-CM | POA: Insufficient documentation

## 2013-12-01 DIAGNOSIS — R531 Weakness: Secondary | ICD-10-CM

## 2013-12-01 DIAGNOSIS — R5383 Other fatigue: Principal | ICD-10-CM

## 2013-12-01 DIAGNOSIS — C717 Malignant neoplasm of brain stem: Secondary | ICD-10-CM | POA: Insufficient documentation

## 2013-12-01 DIAGNOSIS — R5381 Other malaise: Secondary | ICD-10-CM | POA: Insufficient documentation

## 2013-12-01 DIAGNOSIS — M129 Arthropathy, unspecified: Secondary | ICD-10-CM | POA: Insufficient documentation

## 2013-12-01 DIAGNOSIS — M79609 Pain in unspecified limb: Secondary | ICD-10-CM | POA: Insufficient documentation

## 2013-12-01 DIAGNOSIS — Z880 Allergy status to penicillin: Secondary | ICD-10-CM | POA: Insufficient documentation

## 2013-12-01 DIAGNOSIS — R339 Retention of urine, unspecified: Secondary | ICD-10-CM

## 2013-12-01 DIAGNOSIS — Z87442 Personal history of urinary calculi: Secondary | ICD-10-CM | POA: Insufficient documentation

## 2013-12-01 DIAGNOSIS — Z79899 Other long term (current) drug therapy: Secondary | ICD-10-CM | POA: Insufficient documentation

## 2013-12-01 DIAGNOSIS — IMO0002 Reserved for concepts with insufficient information to code with codable children: Secondary | ICD-10-CM | POA: Insufficient documentation

## 2013-12-01 DIAGNOSIS — Z3202 Encounter for pregnancy test, result negative: Secondary | ICD-10-CM | POA: Insufficient documentation

## 2013-12-01 HISTORY — DX: Malignant neoplasm of brain, unspecified: C71.9

## 2013-12-01 HISTORY — DX: Neoplasm of unspecified behavior of brain: D49.6

## 2013-12-01 LAB — COMPREHENSIVE METABOLIC PANEL
ALBUMIN: 2.9 g/dL — AB (ref 3.5–5.2)
ALT: 43 U/L — ABNORMAL HIGH (ref 0–35)
AST: 19 U/L (ref 0–37)
Alkaline Phosphatase: 54 U/L (ref 39–117)
BUN: 23 mg/dL (ref 6–23)
CALCIUM: 8.7 mg/dL (ref 8.4–10.5)
CO2: 25 mEq/L (ref 19–32)
CREATININE: 0.44 mg/dL — AB (ref 0.50–1.10)
Chloride: 105 mEq/L (ref 96–112)
GFR calc Af Amer: >90 mL/min (ref >90)
GFR calc non Af Amer: >90 mL/min (ref >90)
Glucose, Bld: 110 mg/dL — ABNORMAL HIGH (ref 70–99)
Potassium: 4.4 mEq/L (ref 3.7–5.3)
Sodium: 144 mEq/L (ref 137–147)
Total Bilirubin: 0.5 mg/dL (ref 0.3–1.2)
Total Protein: 5.6 g/dL — ABNORMAL LOW (ref 6.0–8.3)

## 2013-12-01 LAB — CBC WITH DIFFERENTIAL/PLATELET
BASOS ABS: 0 10*3/uL (ref 0.0–0.1)
BASOS PCT: 0 % (ref 0–1)
EOS ABS: 0 10*3/uL (ref 0.0–0.7)
EOS PCT: 0 % (ref 0–5)
HCT: 34.7 % — ABNORMAL LOW (ref 36.0–46.0)
Hemoglobin: 11.7 g/dL — ABNORMAL LOW (ref 12.0–15.0)
Lymphocytes Relative: 12 % (ref 12–46)
Lymphs Abs: 0.9 10*3/uL (ref 0.7–4.0)
MCH: 31 pg (ref 26.0–34.0)
MCHC: 33.7 g/dL (ref 30.0–36.0)
MCV: 92 fL (ref 78.0–100.0)
Monocytes Absolute: 0.6 10*3/uL (ref 0.1–1.0)
Monocytes Relative: 7 % (ref 3–12)
Neutro Abs: 6.4 10*3/uL (ref 1.7–7.7)
Neutrophils Relative %: 81 % — ABNORMAL HIGH (ref 43–77)
PLATELETS: 96 10*3/uL — AB (ref 150–400)
RBC: 3.77 MIL/uL — ABNORMAL LOW (ref 3.87–5.11)
RDW: 15.4 % (ref 11.5–15.5)
WBC MORPHOLOGY: INCREASED
WBC: 7.9 10*3/uL (ref 4.0–10.5)

## 2013-12-01 LAB — PROTIME-INR
INR: 0.91 (ref 0.00–1.49)
Prothrombin Time: 12.1 seconds (ref 11.6–15.2)

## 2013-12-01 LAB — URINALYSIS, ROUTINE W REFLEX MICROSCOPIC
Bilirubin Urine: NEGATIVE
GLUCOSE, UA: NEGATIVE mg/dL
Hgb urine dipstick: NEGATIVE
KETONES UR: NEGATIVE mg/dL
LEUKOCYTES UA: NEGATIVE
Nitrite: NEGATIVE
PH: 7 (ref 5.0–8.0)
Protein, ur: NEGATIVE mg/dL
Specific Gravity, Urine: 1.022 (ref 1.005–1.030)
Urobilinogen, UA: 0.2 mg/dL (ref 0.0–1.0)

## 2013-12-01 LAB — ABO/RH: ABO/RH(D): O NEG

## 2013-12-01 LAB — TYPE AND SCREEN
ABO/RH(D): O NEG
Antibody Screen: NEGATIVE

## 2013-12-01 MED ORDER — HYDROMORPHONE HCL PF 1 MG/ML IJ SOLN
2.0000 mg | Freq: Once | INTRAMUSCULAR | Status: AC
  Administered 2013-12-01: 2 mg via INTRAVENOUS
  Filled 2013-12-01: qty 2

## 2013-12-01 MED ORDER — GADOBENATE DIMEGLUMINE 529 MG/ML IV SOLN
20.0000 mL | Freq: Once | INTRAVENOUS | Status: AC | PRN
  Administered 2013-12-01: 20 mL via INTRAVENOUS

## 2013-12-01 MED ORDER — DEXAMETHASONE SODIUM PHOSPHATE 4 MG/ML IJ SOLN
4.0000 mg | Freq: Once | INTRAMUSCULAR | Status: AC
  Administered 2013-12-01: 4 mg via INTRAVENOUS
  Filled 2013-12-01: qty 1

## 2013-12-01 MED ORDER — ONDANSETRON HCL 4 MG/2ML IJ SOLN
4.0000 mg | Freq: Once | INTRAMUSCULAR | Status: AC
  Administered 2013-12-01: 4 mg via INTRAVENOUS
  Filled 2013-12-01: qty 2

## 2013-12-01 MED ORDER — DIAZEPAM 5 MG/ML IJ SOLN
5.0000 mg | Freq: Once | INTRAMUSCULAR | Status: AC
  Administered 2013-12-01: 5 mg via INTRAVENOUS
  Filled 2013-12-01: qty 2

## 2013-12-01 NOTE — ED Notes (Signed)
The pt is still in mri

## 2013-12-01 NOTE — ED Notes (Signed)
The pt just returned  From [facility]

## 2013-12-01 NOTE — ED Provider Notes (Addendum)
CSN: [id number]     Arrival date & time 12/01/13  1522 History   First MD Initiated Contact with Patient 12/01/13 1527     Chief Complaint  Patient presents with  . Extremity Weakness     (Consider location/radiation/quality/duration/timing/severity/associated sxs/prior Treatment) The history is provided by the patient and a relative.  [name] is a 38 y.o. 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 She had a laminectomy done in end of January at [facility]. She is currently on chemotherapy and radiation. She is also been physical therapy at home. She was able to walk to the restaurant yesterday but today she was unable to walk at all. She was unable to urinate starting about 3 AM this morning. Also reports bilateral leg numbness as well as arm pain. Denies any fevers.    Past Medical History  Diagnosis Date  . Asthma   . Migraine   . Kidney stone   . Arthritis   . Brain tumor   . Brain tumor, glioma    Past Surgical History  Procedure Laterality Date  . Laser ablation of the cervix    . Cholecystectomy    . Cesarean section     Family History  Problem Relation Age of Onset  . Allergies Father   . Heart disease Mother   . Heart disease Maternal Grandmother   . Heart disease Paternal Grandmother   . Clotting disorder Mother   . Cancer      grandparents   History  Substance Use Topics  . Smoking status: Never Smoker   . Smokeless tobacco: Not on file  . Alcohol Use: Yes   OB History   Grav Para Term Preterm Abortions TAB SAB Ect Mult Living                 Review of Systems  Genitourinary: Positive for decreased urine volume.  Musculoskeletal: Positive for extremity weakness.       Leg pain   Neurological: Positive for weakness and numbness.  All other systems reviewed and are negative.      Allergies  Penicillins; Albuterol; and Codeine  Home  Medications   Current Outpatient Rx  Name  Route  Sig  Dispense  Refill  . budesonide (PULMICORT) 180 MCG/ACT inhaler   Inhalation   Inhale 4 puffs into the lungs 2 (two) times daily.         . budesonide (RHINOCORT AQUA) 32 MCG/ACT nasal spray   Nasal   Place 2 sprays into the nose daily.         . cetirizine (ZYRTEC) 10 MG tablet   Oral   Take 10 mg by mouth daily.         Marland Kitchen dexamethasone (DECADRON) 4 MG tablet   Oral   Take 4 mg by mouth 2 (two) times daily.         Marland Kitchen docusate sodium (COLACE) 100 MG capsule   Oral   Take 100 mg by mouth at bedtime.         . fish oil-omega-3 fatty acids 1000 MG capsule   Oral   Take 1 g by mouth at bedtime.         . fluconazole (DIFLUCAN) 100 MG tablet   Oral   Take 100 mg by mouth daily.         Marland Kitchen HYDROcodone-acetaminophen (NORCO/VICODIN) 5-325 MG per tablet   Oral  Take 1 tablet by mouth every 6 (six) hours as needed for moderate pain.         Marland Kitchen ibuprofen (ADVIL) 200 MG tablet   Oral   Take 200 mg by mouth every 6 (six) hours as needed. For pain         . levalbuterol (XOPENEX HFA) 45 MCG/ACT inhaler   Inhalation   Inhale 1-2 puffs into the lungs every 4 (four) hours as needed for wheezing.          Marland Kitchen LORazepam (ATIVAN) 1 MG tablet   Oral   Take 1 mg by mouth at bedtime.         Marland Kitchen omeprazole (PRILOSEC) 10 MG capsule   Oral   Take 10 mg by mouth daily.         . ondansetron (ZOFRAN) 4 MG tablet   Oral   Take 4 mg by mouth every 8 (eight) hours as needed for nausea or vomiting.         . temozolomide (TEMODAR) 140 MG capsule   Oral   Take 140 mg by mouth daily. May take on an empty stomach or at bedtime to decrease nausea & vomiting. Take along with the 20mg  tablet to make a total of 160mg .         . temozolomide (TEMODAR) 20 MG capsule   Oral   Take 20 mg by mouth daily. May take on an empty stomach or at bedtime to decrease nausea & vomiting.          BP 136/74  Pulse 99   Temp(Src) 97.4 F (36.3 C) (Oral)  Resp 14  SpO2 90% Physical Exam  Nursing note and vitals reviewed. Constitutional: She is oriented to person, place, and time.  Chronically ill, uncomfortable   HENT:  Head: Normocephalic.  Mouth/Throat: Oropharynx is clear and moist.  Eyes: Conjunctivae are normal. Pupils are equal, round, and reactive to light.  Neck:  Surgical site healing well, no purulent drainage or cellulitis. Dec ROM due to pain.   Cardiovascular: Normal rate, regular rhythm and normal heart sounds.   Pulmonary/Chest: Effort normal and breath sounds normal. No respiratory distress. She has no wheezes. She has no rales.  Abdominal: Soft. Bowel sounds are normal. She exhibits no distension. There is no tenderness. There is no rebound and no guarding.  Genitourinary:  Dec rectal tone   Musculoskeletal: She exhibits no edema.  Neurological: She is alert and oriented to person, place, and time.  Nl strength upper extremities. Strength 2/5 L leg and 3/5 R leg. Diminished reflexes. No saddle anesthesia   Skin: Skin is warm and dry.  Psychiatric: She has a normal mood and affect. Her behavior is normal. Thought content normal.    ED Course  Procedures (including critical care time)  CRITICAL CARE Performed by: Darl Householder, Yanel Dombrosky   Total critical care time: 30 min   Critical care time was exclusive of separately billable procedures and treating other patients.  Critical care was necessary to treat or prevent imminent or life-threatening deterioration.  Critical care was time spent personally by me on the following activities: development of treatment plan with patient and/or surrogate as well as nursing, discussions with consultants, evaluation of patient's response to treatment, examination of patient, obtaining history from patient or surrogate, ordering and performing treatments and interventions, ordering and review of laboratory studies, ordering and review of radiographic  studies, pulse oximetry and re-evaluation of patient's condition.   Labs Review Labs Reviewed  CBC WITH  DIFFERENTIAL - Abnormal; Notable for the following:    RBC 3.77 (*)    Hemoglobin 11.7 (*)    HCT 34.7 (*)    Platelets 96 (*)    Neutrophils Relative % 81 (*)    All other components within normal limits  COMPREHENSIVE METABOLIC PANEL - Abnormal; Notable for the following:    Glucose, Bld 110 (*)    Creatinine, Ser 0.44 (*)    Total Protein 5.6 (*)    Albumin 2.9 (*)    ALT 43 (*)    All other components within normal limits  URINE CULTURE  URINALYSIS, ROUTINE W REFLEX MICROSCOPIC  PROTIME-INR  PREGNANCY, URINE  TYPE AND SCREEN  ABO/RH   Imaging Review Mr Jeri Cos Wo Contrast  12/01/2013   CLINICAL DATA:  Brainstem astrocytoma, status post resection and radiation therapy with new onset left-sided and bilateral lower extremity weakness.  EXAM: MRI HEAD WITHOUT AND WITH CONTRAST  MRI CERVICAL SPINE WITHOUT AND WITH CONTRAST  TECHNIQUE: Multiplanar, multiecho pulse sequences of the brain and surrounding structures, and cervical spine, to include the craniocervical junction and cervicothoracic junction, were obtained without and with intravenous contrast.  CONTRAST:  81mL MULTIHANCE GADOBENATE DIMEGLUMINE 529 MG/ML IV SOLN  COMPARISON:  MR CERVICAL SPINE W/ CM dated 10/01/2013; MR HEAD WO/W CM dated 10/01/2013; MR CERVICAL SPINE W/O CM dated 09/24/2013; MR L SPINE W/O dated 12/01/2013; MR T SPINE W/O CM dated 12/01/2013  FINDINGS: MRI HEAD FINDINGS  No reduced diffusion to suggest acute ischemia ; T2 shine through associated with the pontomedullary mass.  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 The fluid collection mildly deforms the posterior margin of the cervical medullary  cord. Susceptibility artifact along the surgical approach, without intraparenchymal hemorrhage. Small focus of susceptibility artifacts seen in the anterior aspect of a cavum septum pellucidum, new from prior examination. Mild to moderate ventriculomegaly of the supratentorial ventricles is new, with 2-3 mm of surrounding T2 bright subependymal fluid signal, most apparent within the occipital horns. Transverse dimension of the bifrontal horns was 3.5 cm, now 4.3 cm.  The expansile pontomedullary mass shows decreased volume, with stable asymmetric enlargement of the right pyramidal track. No abnormal parenchymal enhancement with particular attention to the posterior fossa.  New small right posterior fossa low-density fluid collection extending along the right cerebellar tentorium may reflect hygroma. Normal major intracranial vascular flow voids seen at the skull base. No abnormal sellar expansion. Paranasal sinuses and mastoid air cells are well aerated.  MRI CERVICAL SPINE FINDINGS  Improved appearance of the expansile mass contiguous with the pontomedullary mass 2 the level of C3, the AP dimension of the spinal cord at C2 was 12 mm, now 10-11 mm. No syrinx. Faint enhancement within the right dorsal lateral spinal cord at C2 is slightly decreased, previously 23 mm in craniocaudad dimension, now 18 mm. Faint enhancement within the spinal cord at C3-4 bilaterally is unchanged.  Straightened cervical lordosis. Intervertebral discs demonstrate relatively preserved morphology and signal characteristics without abnormal osseous or intradiscal enhancement. Prevertebral soft tissues are nonsuspicious. No fracture nor malalignment.  Minimal annular bulging at C5-6 without canal stenosis or neural foraminal narrowing.  IMPRESSION: MRI head: Status post interval suboccipital decompressive craniectomy for pontomedullary mass reported as astrocytoma. Overall improved appearance of the mass consistent with treatment response in  a background  of residual tumor. 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 New hydrocephalus, with transependymal flow cerebral spinal fluid likely reflected to obstruction at the level of the cervicomedullary junction.  Punctate focus of susceptibility artifact within the cavum septum pellucidum may reflect nondependent blood products or air.  MRI cervical spine: Improved, decreased size of expansile pontomedullary mass extending-C3, overall less mass effect increased decreased enhancement consistent with response to treatment.  Findings discussed with and reconfirmed by Dr.Alain Deschene on 12/01/2013 10:10 p.m.   Electronically Signed   By: Elon Alas   On: 12/01/2013 22:32   Mr Cervical Spine W Wo Contrast  12/01/2013   CLINICAL DATA:  Brainstem astrocytoma, status post resection and radiation therapy with new onset left-sided and bilateral lower extremity weakness.  EXAM: MRI HEAD WITHOUT AND WITH CONTRAST  MRI CERVICAL SPINE WITHOUT AND WITH CONTRAST  TECHNIQUE: Multiplanar, multiecho pulse sequences of the brain and surrounding structures, and cervical spine, to include the craniocervical junction and cervicothoracic junction, were obtained without and with intravenous contrast.  CONTRAST:  17mL MULTIHANCE GADOBENATE DIMEGLUMINE 529 MG/ML IV SOLN  COMPARISON:  MR CERVICAL SPINE W/ CM dated 10/01/2013; MR HEAD WO/W CM dated 10/01/2013; MR CERVICAL SPINE W/O CM dated 09/24/2013; MR L SPINE W/O dated 12/01/2013; MR T SPINE W/O CM dated 12/01/2013  FINDINGS: MRI HEAD FINDINGS  No reduced diffusion to suggest acute ischemia ; T2 shine through associated with the pontomedullary mass.  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 The fluid collection mildly deforms the posterior margin of the cervical medullary cord. Susceptibility artifact along the surgical approach, without intraparenchymal hemorrhage. Small focus of susceptibility artifacts seen in the anterior aspect of a cavum septum pellucidum, new from prior examination. Mild to moderate ventriculomegaly of the supratentorial ventricles is new, with 2-3 mm of surrounding T2 bright subependymal fluid signal, most apparent within the occipital horns. Transverse dimension of the bifrontal horns was 3.5 cm, now 4.3 cm.  The expansile pontomedullary mass shows decreased volume, with stable asymmetric enlargement of the right pyramidal track. No abnormal parenchymal enhancement with particular attention to the posterior fossa.  New small right posterior fossa low-density fluid collection extending along the right cerebellar tentorium may reflect hygroma. Normal major intracranial vascular flow voids seen at the skull base. No abnormal sellar expansion. Paranasal sinuses and mastoid air cells are well aerated.  MRI CERVICAL SPINE FINDINGS  Improved appearance of the expansile mass contiguous with the pontomedullary mass 2 the level of C3, the AP dimension of the spinal cord at C2 was 12 mm, now 10-11 mm. No syrinx. Faint enhancement within the right dorsal lateral spinal cord at C2 is slightly decreased, previously 23 mm in craniocaudad dimension, now 18 mm. Faint enhancement within the spinal cord at C3-4 bilaterally is unchanged.  Straightened cervical lordosis. Intervertebral discs demonstrate relatively preserved morphology and signal characteristics without abnormal osseous or intradiscal enhancement. Prevertebral soft tissues are nonsuspicious. No fracture nor malalignment.  Minimal annular bulging at C5-6 without canal stenosis or neural foraminal narrowing.  IMPRESSION: MRI head: Status post interval suboccipital decompressive  craniectomy for pontomedullary mass reported as astrocytoma. Overall improved appearance of the mass consistent with treatment response in a background of residual tumor. 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 New hydrocephalus, with transependymal flow cerebral spinal fluid likely reflected to obstruction at the level of the cervicomedullary junction.  Punctate focus of susceptibility artifact within the cavum septum pellucidum may reflect nondependent blood products or air.  MRI cervical spine: Improved, decreased size of expansile pontomedullary mass extending-C3, overall less mass effect increased decreased enhancement consistent with response to treatment.  Findings discussed with and reconfirmed by Dr.Sotero Brinkmeyer on 12/01/2013 10:10 p.m.   Electronically Signed   By: Elon Alas   On: 12/01/2013 22:32     EKG Interpretation None      MDM   Final diagnoses:  Urinary retention  Weakness  Cauda equina syndrome  Hydrocephalus   [name] is a 38 y.o. female here with leg pain, urinary retention. I am concerned that she has cauda equina from mass or from previous surgery and radiation. Will get MRI brain and entire spine. Will give pain meds and likely need transfer to [facility].   10 PM MRI showed some post op changes but no acute process. I called Dr. Salomon Fick at [facility], who will see patient in the ED. Reason for transfer is continuity of care.   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 I think its likely causing her symptoms. Dr. Salomon Fick is aware.     Wandra Arthurs, MD 12/01/13 2229  Wandra Arthurs, MD 12/01/13 2236

## 2013-12-01 NOTE — ED Notes (Signed)
PT is unable to void since 0500.

## 2013-12-01 NOTE — ED Notes (Signed)
Pt being loaded into [employer] truck

## 2013-12-01 NOTE — ED Notes (Signed)
PT woke up this AM around 0500 due to pain in her legs . Pt was unable to stand ,which is new for PT.  Pt also reports she is unable to void which is also new.

## 2013-12-01 NOTE — ED Notes (Signed)
The pt has been in mri since my arrival

## 2013-12-01 NOTE — ED Notes (Signed)
Report called to [facility].  Transfer chg rn andrea

## 2013-12-02 LAB — URINE CULTURE
CULTURE: NO GROWTH
Colony Count: NO GROWTH

## 2014-07-25 DIAGNOSIS — M47812 Spondylosis without myelopathy or radiculopathy, cervical region: Secondary | ICD-10-CM | POA: Insufficient documentation

## 2014-07-25 DIAGNOSIS — R51 Headache: Secondary | ICD-10-CM

## 2014-07-25 DIAGNOSIS — R519 Headache, unspecified: Secondary | ICD-10-CM | POA: Insufficient documentation

## 2014-10-06 ENCOUNTER — Encounter (HOSPITAL_COMMUNITY): Payer: Self-pay | Admitting: Emergency Medicine

## 2014-10-06 ENCOUNTER — Emergency Department (HOSPITAL_COMMUNITY)
Admission: EM | Admit: 2014-10-06 | Discharge: 2014-10-06 | Disposition: A | Discharge status: Home / Self Care | Payer: BC Managed Care – PPO | Attending: Emergency Medicine | Admitting: Emergency Medicine

## 2014-10-06 DIAGNOSIS — R300 Dysuria: Secondary | ICD-10-CM | POA: Diagnosis not present

## 2014-10-06 DIAGNOSIS — G43909 Migraine, unspecified, not intractable, without status migrainosus: Secondary | ICD-10-CM | POA: Diagnosis not present

## 2014-10-06 DIAGNOSIS — R34 Anuria and oliguria: Secondary | ICD-10-CM | POA: Insufficient documentation

## 2014-10-06 DIAGNOSIS — Z79899 Other long term (current) drug therapy: Secondary | ICD-10-CM | POA: Insufficient documentation

## 2014-10-06 DIAGNOSIS — Z7951 Long term (current) use of inhaled steroids: Secondary | ICD-10-CM | POA: Diagnosis not present

## 2014-10-06 DIAGNOSIS — M545 Low back pain: Secondary | ICD-10-CM

## 2014-10-06 DIAGNOSIS — J45909 Unspecified asthma, uncomplicated: Secondary | ICD-10-CM | POA: Insufficient documentation

## 2014-10-06 DIAGNOSIS — Z880 Allergy status to penicillin: Secondary | ICD-10-CM | POA: Diagnosis not present

## 2014-10-06 DIAGNOSIS — Z87442 Personal history of urinary calculi: Secondary | ICD-10-CM | POA: Diagnosis not present

## 2014-10-06 DIAGNOSIS — Z86011 Personal history of benign neoplasm of the brain: Secondary | ICD-10-CM | POA: Diagnosis not present

## 2014-10-06 DIAGNOSIS — M1990 Unspecified osteoarthritis, unspecified site: Secondary | ICD-10-CM | POA: Insufficient documentation

## 2014-10-06 DIAGNOSIS — M5440 Lumbago with sciatica, unspecified side: Secondary | ICD-10-CM | POA: Insufficient documentation

## 2014-10-06 LAB — CBC WITH DIFFERENTIAL/PLATELET
BASOS ABS: 0 10*3/uL (ref 0.0–0.1)
Basophils Relative: 0 % (ref 0–1)
EOS ABS: 0.1 10*3/uL (ref 0.0–0.7)
EOS PCT: 1 % (ref 0–5)
HCT: 40.6 % (ref 36.0–46.0)
Hemoglobin: 13.8 g/dL (ref 12.0–15.0)
Lymphocytes Relative: 26 % (ref 12–46)
Lymphs Abs: 1.5 10*3/uL (ref 0.7–4.0)
MCH: 31.2 pg (ref 26.0–34.0)
MCHC: 34 g/dL (ref 30.0–36.0)
MCV: 91.9 fL (ref 78.0–100.0)
Monocytes Absolute: 0.6 10*3/uL (ref 0.1–1.0)
Monocytes Relative: 11 % (ref 3–12)
Neutro Abs: 3.5 10*3/uL (ref 1.7–7.7)
Neutrophils Relative %: 62 % (ref 43–77)
PLATELETS: 163 10*3/uL (ref 150–400)
RBC: 4.42 MIL/uL (ref 3.87–5.11)
RDW: 12.3 % (ref 11.5–15.5)
WBC: 5.6 10*3/uL (ref 4.0–10.5)

## 2014-10-06 LAB — BASIC METABOLIC PANEL
ANION GAP: 6 (ref 5–15)
BUN: 10 mg/dL (ref 6–23)
CALCIUM: 9.2 mg/dL (ref 8.4–10.5)
CO2: 25 mmol/L (ref 19–32)
Chloride: 107 mEq/L (ref 96–112)
Creatinine, Ser: 0.52 mg/dL (ref 0.50–1.10)
GLUCOSE: 90 mg/dL (ref 70–99)
Potassium: 3.7 mmol/L (ref 3.5–5.1)
SODIUM: 138 mmol/L (ref 135–145)

## 2014-10-06 LAB — URINALYSIS, ROUTINE W REFLEX MICROSCOPIC
Bilirubin Urine: NEGATIVE
Glucose, UA: NEGATIVE mg/dL
KETONES UR: NEGATIVE mg/dL
Leukocytes, UA: NEGATIVE
NITRITE: NEGATIVE
PH: 6.5 (ref 5.0–8.0)
Protein, ur: NEGATIVE mg/dL
SPECIFIC GRAVITY, URINE: 1.002 — AB (ref 1.005–1.030)
UROBILINOGEN UA: 0.2 mg/dL (ref 0.0–1.0)

## 2014-10-06 LAB — URINE MICROSCOPIC-ADD ON

## 2014-10-06 MED ORDER — PHENAZOPYRIDINE HCL 200 MG PO TABS: 200.0000 mg | ORAL_TABLET | Freq: Three times a day (TID) | ORAL | Status: DC

## 2014-10-06 MED ORDER — HYDROMORPHONE HCL 1 MG/ML IJ SOLN
1.0000 mg | Freq: Once | INTRAMUSCULAR | Status: AC
  Administered 2014-10-06: 1 mg via INTRAVENOUS
  Filled 2014-10-06: qty 1

## 2014-10-06 MED ORDER — SODIUM CHLORIDE 0.9 % IV BOLUS (SEPSIS)
500.0000 mL | Freq: Once | INTRAVENOUS | Status: AC
  Administered 2014-10-06: 500 mL via INTRAVENOUS

## 2014-10-06 MED ORDER — CIPROFLOXACIN HCL 500 MG PO TABS: 500.0000 mg | ORAL_TABLET | Freq: Two times a day (BID) | ORAL | Status: DC

## 2014-10-06 MED ORDER — KETOROLAC TROMETHAMINE 30 MG/ML IJ SOLN
30.0000 mg | Freq: Once | INTRAMUSCULAR | Status: AC
  Administered 2014-10-06: 30 mg via INTRAVENOUS
  Filled 2014-10-06: qty 1

## 2014-10-06 MED ORDER — CIPROFLOXACIN HCL 500 MG PO TABS
500.0000 mg | ORAL_TABLET | Freq: Once | ORAL | Status: AC
  Administered 2014-10-06: 500 mg via ORAL
  Filled 2014-10-06: qty 1

## 2014-10-06 MED ORDER — PHENAZOPYRIDINE HCL 200 MG PO TABS
200.0000 mg | ORAL_TABLET | ORAL | Status: AC
  Administered 2014-10-06: 200 mg via ORAL
  Filled 2014-10-06: qty 1

## 2014-10-06 NOTE — ED Notes (Signed)
Awake. Verbally responsive. A/O x4. Resp even and unlabored. No audible adventitious breath sounds noted. ABC's intact. NAD noted. 

## 2014-10-06 NOTE — ED Provider Notes (Signed)
CSN: [id number]     Arrival date & time 10/06/14  1441 History   First MD Initiated Contact with Patient 10/06/14 1721     Chief Complaint  Patient presents with  . Back Pain  . Abdominal Pain    lower     (Consider location/radiation/quality/duration/timing/severity/associated sxs/prior Treatment) Patient is a 39 y.o. female presenting with back pain and abdominal pain. The history is provided by the patient.  Back Pain Pain location: parspinal lumbar area. Quality:  Aching Radiates to:  Does not radiate Pain severity:  Mild Pain is:  Same all the time Onset quality:  Gradual Duration:  4 days Timing:  Constant Progression:  Unchanged Chronicity:  New Context comment:  Urinary sx Relieved by:  Nothing Worsened by:  Nothing tried Ineffective treatments: dilaudid. Associated symptoms: dysuria   Associated symptoms: no abdominal pain, no chest pain, no fever and no headaches   Abdominal Pain Associated symptoms: dysuria   Associated symptoms: no chest pain, no cough, no diarrhea, no fatigue, no fever, no hematuria, no nausea, no shortness of breath and no vomiting     Past Medical History  Diagnosis Date  . Asthma   . Migraine   . Kidney stone   . Arthritis   . Brain tumor   . Brain tumor, glioma    Past Surgical History  Procedure Laterality Date  . Laser ablation of the cervix    . Cholecystectomy    . Cesarean section     Family History  Problem Relation Age of Onset  . Allergies Father   . Heart disease Mother   . Heart disease Maternal Grandmother   . Heart disease Paternal Grandmother   . Clotting disorder Mother   . Cancer      grandparents   History  Substance Use Topics  . Smoking status: Never Smoker   . Smokeless tobacco: Not on file  . Alcohol Use: Yes   OB History    No data available     Review of Systems  Constitutional: Negative for fever and fatigue.  HENT: Negative for congestion and drooling.   Eyes: Negative for pain.   Respiratory: Negative for cough and shortness of breath.   Cardiovascular: Negative for chest pain.  Gastrointestinal: Negative for nausea, vomiting, abdominal pain and diarrhea.  Genitourinary: Positive for dysuria and decreased urine volume. Negative for hematuria.  Musculoskeletal: Negative for back pain, gait problem and neck pain.  Skin: Negative for color change.  Neurological: Negative for dizziness and headaches.  Hematological: Negative for adenopathy.  Psychiatric/Behavioral: Negative for behavioral problems.  All other systems reviewed and are negative.     Allergies  Penicillins; Albuterol; and Codeine  Home Medications   Prior to Admission medications   Medication Sig Start Date End Date Taking? Authorizing Provider  budesonide (PULMICORT) 180 MCG/ACT inhaler Inhale 2 puffs into the lungs 2 (two) times daily.   Yes Historical Provider, MD  budesonide (RHINOCORT AQUA) 32 MCG/ACT nasal spray Place 1 spray into the nose daily.    Yes Historical Provider, MD  cetirizine (ZYRTEC) 10 MG tablet Take 10 mg by mouth daily.   Yes Historical Provider, MD  docusate sodium (COLACE) 100 MG capsule Take 100 mg by mouth at bedtime.   Yes Historical Provider, MD  fish oil-omega-3 fatty acids 1000 MG capsule Take 1 g by mouth at bedtime.   Yes Historical Provider, MD  HYDROmorphone (DILAUDID) 4 MG tablet Take 4 mg by mouth 2 (two) times daily.   Yes  Historical Provider, MD  levalbuterol [facility] HFA) 45 MCG/ACT inhaler Inhale 1-2 puffs into the lungs every 4 (four) hours as needed for wheezing.    Yes Historical Provider, MD  Melatonin 1 MG CAPS Take 1 capsule by mouth at bedtime.   Yes Historical Provider, MD  omeprazole (PRILOSEC OTC) 20 MG tablet Take 20 mg by mouth at bedtime.   Yes Historical Provider, MD  ondansetron (ZOFRAN) 4 MG tablet Take 4 mg by mouth every 8 (eight) hours as needed for nausea or vomiting.   Yes Historical Provider, MD  polyethylene glycol (MIRALAX / GLYCOLAX)  packet Take 17 g by mouth daily as needed for mild constipation.   Yes Historical Provider, MD  pregabalin (LYRICA) 50 MG capsule Take 100 mg by mouth 3 (three) times daily.   Yes Historical Provider, MD  tiZANidine (ZANAFLEX) 4 MG tablet Take 4 mg by mouth daily.   Yes Historical Provider, MD   BP 134/79 mmHg  Pulse 86  Temp(Src) 97.7 F (36.5 C) (Oral)  Resp 18  SpO2 99% Physical Exam  Constitutional: She is oriented to person, place, and time. She appears well-developed and well-nourished.  HENT:  Head: Normocephalic.  Mouth/Throat: Oropharynx is clear and moist. No oropharyngeal exudate.  Eyes: Conjunctivae and EOM are normal. Pupils are equal, round, and reactive to light.  Neck: Normal range of motion. Neck supple.  Cardiovascular: Normal rate, regular rhythm, normal heart sounds and intact distal pulses.  Exam reveals no gallop and no friction rub.   No murmur heard. Pulmonary/Chest: Effort normal and breath sounds normal. No respiratory distress. She has no wheezes.  Abdominal: Soft. Bowel sounds are normal. There is no tenderness. There is no rebound and no guarding.  Musculoskeletal: Normal range of motion. She exhibits no edema or tenderness.  Neurological: She is alert and oriented to person, place, and time.  Skin: Skin is warm and dry.  Psychiatric: She has a normal mood and affect. Her behavior is normal.  Nursing note and vitals reviewed.   ED Course  Procedures (including critical care time) Labs Review Labs Reviewed  URINALYSIS, ROUTINE W REFLEX MICROSCOPIC - Abnormal; Notable for the following:    Specific Gravity, Urine 1.002 (*)    Hgb urine dipstick SMALL (*)    All other components within normal limits  URINE MICROSCOPIC-ADD ON  CBC WITH DIFFERENTIAL  BASIC METABOLIC PANEL    Imaging Review No results found.   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 She notes mild low back pain which is aching in nature and an uncomfortable sensation in her urethra. She felt like she had some urinary retention a few days ago but does not feel like that anymore. She denies any fevers, vomiting, or diarrhea. She denies any abdominal pain. She does have a history of kidney stones but states this does not feel like that. We'll get screening labs. Urinalysis shows small hemoglobin but otherwise negative. Her symptoms are consistent with a UTI so will treat empirically. Will get pain control and post void residual.   Post void residual of 58 ml.   7:46 PM: Pt feeling mildly better. I have discussed the diagnosis/risks/treatment options with the patient and believe the pt to be eligible for  discharge home to follow-up with her pcp as needed. We also discussed returning to the ED immediately if new or worsening sx occur. We discussed the sx which are most concerning (e.g., worsening pain, inability to take abx, fever) that necessitate immediate return. Medications administered to the patient during their visit and any new prescriptions provided to the patient are listed below.  Medications given during this visit Medications  HYDROmorphone (DILAUDID) injection 1 mg (1 mg Intravenous Given 10/06/14 1812)  ketorolac (TORADOL) 30 MG/ML injection 30 mg (30 mg Intravenous Given 10/06/14 1812)  phenazopyridine (PYRIDIUM) tablet 200 mg (200 mg Oral Given 10/06/14 1813)  sodium chloride 0.9 % bolus 500 mL (500 mLs Intravenous New Bag/Given 10/06/14 1813)  ciprofloxacin (CIPRO) tablet 500 mg (500 mg Oral Given 10/06/14 1813)    Discharge Medication List as of 10/06/2014  8:27 PM    START taking these medications   Details  ciprofloxacin (CIPRO) 500 MG tablet Take 1 tablet (500 mg total) by mouth 2 (two) times daily. One po bid x 7 days,  Starting 10/06/2014, Until Discontinued, Print    phenazopyridine (PYRIDIUM) 200 MG tablet Take 1 tablet (200 mg total) by mouth 3 (three) times daily., Starting 10/06/2014, Until Discontinued, Print         Pamella Pert, MD 10/06/14 2237

## 2014-10-06 NOTE — ED Notes (Signed)
Pt c/o "groin pain that's in the middle" and lower back pain over the past couple of days.  Pt states that she was having trouble urinating the other day but not anymore. Pt had PMH kidney stones.  Pt denies n/v, hematuria.

## 2014-10-06 NOTE — ED Notes (Signed)
Awake. Verbally responsive. A/O x4. Resp even and unlabored. No audible adventitious breath sounds noted. ABC's intact.  

## 2014-10-16 ENCOUNTER — Encounter (HOSPITAL_COMMUNITY): Payer: Self-pay | Admitting: Emergency Medicine

## 2014-10-16 ENCOUNTER — Emergency Department (HOSPITAL_COMMUNITY)
Admission: EM | Admit: 2014-10-16 | Discharge: 2014-10-17 | Disposition: A | Discharge status: Home / Self Care | Payer: BLUE CROSS/BLUE SHIELD | Attending: Emergency Medicine | Admitting: Emergency Medicine

## 2014-10-16 ENCOUNTER — Other Ambulatory Visit: Payer: Self-pay | Admitting: Urology

## 2014-10-16 DIAGNOSIS — Z9049 Acquired absence of other specified parts of digestive tract: Secondary | ICD-10-CM | POA: Insufficient documentation

## 2014-10-16 DIAGNOSIS — G43909 Migraine, unspecified, not intractable, without status migrainosus: Secondary | ICD-10-CM | POA: Diagnosis not present

## 2014-10-16 DIAGNOSIS — Z880 Allergy status to penicillin: Secondary | ICD-10-CM | POA: Insufficient documentation

## 2014-10-16 DIAGNOSIS — R109 Unspecified abdominal pain: Secondary | ICD-10-CM

## 2014-10-16 DIAGNOSIS — Z792 Long term (current) use of antibiotics: Secondary | ICD-10-CM | POA: Insufficient documentation

## 2014-10-16 DIAGNOSIS — M1990 Unspecified osteoarthritis, unspecified site: Secondary | ICD-10-CM | POA: Diagnosis not present

## 2014-10-16 DIAGNOSIS — Z79899 Other long term (current) drug therapy: Secondary | ICD-10-CM | POA: Insufficient documentation

## 2014-10-16 DIAGNOSIS — J45909 Unspecified asthma, uncomplicated: Secondary | ICD-10-CM | POA: Diagnosis not present

## 2014-10-16 DIAGNOSIS — R1032 Left lower quadrant pain: Secondary | ICD-10-CM | POA: Diagnosis present

## 2014-10-16 DIAGNOSIS — Z86011 Personal history of benign neoplasm of the brain: Secondary | ICD-10-CM | POA: Insufficient documentation

## 2014-10-16 DIAGNOSIS — Z87442 Personal history of urinary calculi: Secondary | ICD-10-CM | POA: Diagnosis not present

## 2014-10-16 DIAGNOSIS — Z9889 Other specified postprocedural states: Secondary | ICD-10-CM | POA: Diagnosis not present

## 2014-10-16 DIAGNOSIS — M545 Low back pain: Secondary | ICD-10-CM | POA: Insufficient documentation

## 2014-10-16 DIAGNOSIS — Z7951 Long term (current) use of inhaled steroids: Secondary | ICD-10-CM | POA: Diagnosis not present

## 2014-10-16 MED ORDER — HYDROMORPHONE HCL 2 MG/ML IJ SOLN
3.0000 mg | Freq: Once | INTRAMUSCULAR | Status: AC
  Administered 2014-10-16: 3 mg via INTRAVENOUS
  Filled 2014-10-16: qty 2

## 2014-10-16 MED ORDER — HYDROMORPHONE HCL 2 MG/ML IJ SOLN
4.0000 mg | Freq: Once | INTRAMUSCULAR | Status: AC
  Administered 2014-10-17: 4 mg via INTRAVENOUS
  Filled 2014-10-16: qty 2

## 2014-10-16 NOTE — ED Provider Notes (Signed)
CSN: [id number]     Arrival date & time 10/16/14  1704 History   First MD Initiated Contact with Patient 10/16/14 2128     Chief Complaint  Patient presents with  . Nephrolithiasis     (Consider location/radiation/quality/duration/timing/severity/associated sxs/prior Treatment) HPI [name] is a 39 y.o. female with a history of recurrent kidney stone followed by Dr. Janice Norrie at [facility] urology. Reports she was seen there today, had a CT scan and determined to have a renal stone. Reports surgery is scheduled for Friday. Was told to come to the ED for pain management if home Dilaudid did not work. Patient reports increased L flank pain today that has been progressive and intermittent. Not relieved by 8mg  of Dilaudid at home at 2pm. Denies fevers, N/V/D/C, other abdominal pain, cp, sob, dysuria, hematuria, vaginal bleeding or discharge.   Past Medical History  Diagnosis Date  . Asthma   . Migraine   . Kidney stone   . Brain tumor   . Brain tumor, glioma   . Arthritis    Past Surgical History  Procedure Laterality Date  . Laser ablation of the cervix    . Cholecystectomy    . Cesarean section    . Brain surgery  2015    Brain surgery x 4 in 2015 at [place] History  Problem Relation Age of Onset  . Allergies Father   . Heart disease Mother   . Heart disease Maternal Grandmother   . Heart disease Paternal Grandmother   . Clotting disorder Mother   . Cancer      grandparents   History  Substance Use Topics  . Smoking status: Never Smoker   . Smokeless tobacco: Never Used  . Alcohol Use: No   OB History    No data available     Review of Systems  All other systems reviewed and are negative.  A 10 point review of systems was completed and was negative except for pertinent positives and negatives as mentioned in the history of present illness     Allergies  Penicillins; Albuterol; and Codeine  Home Medications   Prior to Admission  medications   Medication Sig Start Date End Date Taking? Authorizing Provider  budesonide (PULMICORT) 180 MCG/ACT inhaler Inhale 2 puffs into the lungs 2 (two) times daily.   Yes Historical Provider, MD  budesonide (RHINOCORT AQUA) 32 MCG/ACT nasal spray Place 1 spray into the nose daily.    Yes Historical Provider, MD  cetirizine (ZYRTEC) 10 MG tablet Take 10 mg by mouth daily.   Yes Historical Provider, MD  fish oil-omega-3 fatty acids 1000 MG capsule Take 1 g by mouth at bedtime.   Yes Historical Provider, MD  HYDROmorphone (DILAUDID) 4 MG tablet Take 4-8 mg by mouth 3 (three) times daily as needed for severe pain.    Yes Historical Provider, MD  levalbuterol [facility] HFA) 45 MCG/ACT inhaler Inhale 1-2 puffs into the lungs every 4 (four) hours as needed for wheezing.    Yes Historical Provider, MD  Melaton-Thean-Cham-PassF-LBalm (SLEEP PO) Take 1 tablet by mouth at bedtime.   Yes Historical Provider, MD  omeprazole (PRILOSEC OTC) 20 MG tablet Take 20 mg by mouth at bedtime.   Yes Historical Provider, MD  ondansetron (ZOFRAN) 4 MG tablet Take 4 mg by mouth every 8 (eight) hours as needed for nausea or vomiting.   Yes Historical Provider, MD  pregabalin (LYRICA) 50 MG capsule Take 100 mg by mouth 3 (three) times  daily.   Yes Historical Provider, MD  senna (SENOKOT) 8.6 MG TABS tablet Take 1 tablet by mouth at bedtime.   Yes Historical Provider, MD  tiZANidine (ZANAFLEX) 4 MG tablet Take 4 mg by mouth daily.   Yes Historical Provider, MD  ciprofloxacin (CIPRO) 500 MG tablet Take 1 tablet (500 mg total) by mouth 2 (two) times daily. One po bid x 7 days 10/06/14   Pamella Pert, MD  phenazopyridine (PYRIDIUM) 200 MG tablet Take 1 tablet (200 mg total) by mouth 3 (three) times daily. 10/06/14   Pamella Pert, MD   BP 122/69 mmHg  Pulse 94  Temp(Src) 97.4 F (36.3 C) (Oral)  Resp 15  SpO2 98% Physical Exam  Constitutional: She is oriented to person, place, and time. She appears well-developed  and well-nourished.  HENT:  Head: Normocephalic and atraumatic.  Mouth/Throat: Oropharynx is clear and moist.  Eyes: Conjunctivae are normal. Pupils are equal, round, and reactive to light. Right eye exhibits no discharge. Left eye exhibits no discharge. No scleral icterus.  Neck: Neck supple.  Cardiovascular: Normal rate, regular rhythm and normal heart sounds.   Pulmonary/Chest: Effort normal and breath sounds normal. No respiratory distress. She has no wheezes. She has no rales.  Abdominal: Soft.  Mild tenderness to L lower back as well as LLQ. No peritoneal signs.  Musculoskeletal: She exhibits no tenderness.  Neurological: She is alert and oriented to person, place, and time.  Cranial Nerves II-XII grossly intact  Skin: Skin is warm and dry. No rash noted.  Psychiatric: She has a normal mood and affect.  Nursing note and vitals reviewed.   ED Course  Procedures (including critical care time) Labs Review Labs Reviewed - No data to display  Imaging Review No results found.   EKG Interpretation None     Meds given in ED:  Medications  HYDROmorphone (DILAUDID) injection 3 mg (3 mg Intravenous Given 10/16/14 2246)  HYDROmorphone (DILAUDID) injection 4 mg (4 mg Intravenous Given 10/17/14 0029)    Discharge Medication List as of 10/17/2014  1:07 AM     Filed Vitals:   10/16/14 1711 10/16/14 2144 10/17/14 0030  BP: 138/78 130/72 122/69  Pulse: 105 81 94  Temp: 98.5 F (36.9 C) 97.4 F (36.3 C)   TempSrc: Oral Oral   Resp: 20 16 15   SpO2: 97% 99% 98%    MDM  Vitals stable - WNL -afebrile Pt resting comfortably in ED. pain is much improved after 2 rounds of Dilaudid. Patient states she is ready to go home and will use her home pain medicine until her appointment on Friday with urology. PE--mild tenderness throughout left flank. Not concerning further acute or emergent pathology.   Patient with known nephrolithiasis via CT scan today done by [facility], Dr.  Janice Norrie. He is scheduled to perform surgery on Friday for stone removal. I discussed all relevant lab findings and imaging results with pt and they verbalized understanding. Discussed f/u with PCP within 48 hrs and return precautions, pt very amenable to plan.  Final diagnoses:  Left flank pain        Verl Dicker, PA-C 10/17/14 1321  Debby Freiberg, MD 10/18/14 [phone]

## 2014-10-16 NOTE — ED Notes (Signed)
Pt has confirmed kidney stone from CT performed today, pt is suppose to have surgery to remove stone on Friday. Pt has pain meds at home but it is not working and told to come in for pain management and if it could be controlled they would possibly do surgery tonight.

## 2014-10-17 ENCOUNTER — Encounter (HOSPITAL_COMMUNITY): Payer: Self-pay | Admitting: *Deleted

## 2014-10-17 NOTE — Discharge Instructions (Signed)
Please follow-up with your urologist for your regularly scheduled appointment on Friday. Please take your home pain medicines to help with discomfort you may experience. Return to ED for worsening symptoms, increased pain, fevers, abdominal pain

## 2014-10-17 NOTE — Progress Notes (Signed)
Spoke with Mrs. Waincott about taking the Hibiclens shower she stated that will make my skin bleed.  I let her know that it is the hospital policy that we give the instructions about taking the Hibiclens shower or bath pre op HS and AM.  I mentioned that if the pre op showers are not taken the patient usually receive a wipe down bath the day of the surgery at the hospital.  I asked if she could use dial soap. She told me she could only use Dove soap that her dermatologist recommended which works well because it is a mild soap.  I told her I was sorry about this situation and asked her to call Dr. Janice Norrie.  I told her that a note about the situation would be in her record and everything would be done for her safety and comfort. Called Dr. Sammie Bench office to let him know about the situation.

## 2014-10-18 ENCOUNTER — Ambulatory Visit (HOSPITAL_COMMUNITY)
Admission: RE | Admit: 2014-10-18 | Discharge: 2014-10-18 | Disposition: A | Discharge status: Home / Self Care | Payer: BLUE CROSS/BLUE SHIELD | Source: Ambulatory Visit | Attending: Urology | Admitting: Urology

## 2014-10-18 ENCOUNTER — Encounter (HOSPITAL_COMMUNITY)
Admission: RE | Disposition: A | Discharge status: Home / Self Care | Payer: Self-pay | Source: Ambulatory Visit | Attending: Urology

## 2014-10-18 ENCOUNTER — Ambulatory Visit (HOSPITAL_COMMUNITY): Payer: BLUE CROSS/BLUE SHIELD | Admitting: Anesthesiology

## 2014-10-18 ENCOUNTER — Encounter (HOSPITAL_COMMUNITY): Payer: Self-pay | Admitting: *Deleted

## 2014-10-18 DIAGNOSIS — F329 Major depressive disorder, single episode, unspecified: Secondary | ICD-10-CM | POA: Insufficient documentation

## 2014-10-18 DIAGNOSIS — N202 Calculus of kidney with calculus of ureter: Secondary | ICD-10-CM | POA: Diagnosis not present

## 2014-10-18 DIAGNOSIS — Z79899 Other long term (current) drug therapy: Secondary | ICD-10-CM | POA: Diagnosis not present

## 2014-10-18 DIAGNOSIS — J45909 Unspecified asthma, uncomplicated: Secondary | ICD-10-CM | POA: Diagnosis not present

## 2014-10-18 DIAGNOSIS — N1330 Unspecified hydronephrosis: Secondary | ICD-10-CM | POA: Diagnosis not present

## 2014-10-18 DIAGNOSIS — M1990 Unspecified osteoarthritis, unspecified site: Secondary | ICD-10-CM | POA: Diagnosis not present

## 2014-10-18 DIAGNOSIS — Z9221 Personal history of antineoplastic chemotherapy: Secondary | ICD-10-CM | POA: Insufficient documentation

## 2014-10-18 DIAGNOSIS — Z6830 Body mass index (BMI) 30.0-30.9, adult: Secondary | ICD-10-CM | POA: Insufficient documentation

## 2014-10-18 DIAGNOSIS — R309 Painful micturition, unspecified: Secondary | ICD-10-CM | POA: Diagnosis present

## 2014-10-18 HISTORY — DX: Anxiety disorder, unspecified: F41.9

## 2014-10-18 HISTORY — PX: CYSTOSCOPY WITH RETROGRADE PYELOGRAM, URETEROSCOPY AND STENT PLACEMENT: SHX5789

## 2014-10-18 HISTORY — PX: HOLMIUM LASER APPLICATION: SHX5852

## 2014-10-18 HISTORY — DX: Malignant (primary) neoplasm, unspecified: C80.1

## 2014-10-18 HISTORY — DX: Pneumonia, unspecified organism: J18.9

## 2014-10-18 LAB — HCG, SERUM, QUALITATIVE: Preg, Serum: NEGATIVE

## 2014-10-18 SURGERY — CYSTOURETEROSCOPY, WITH RETROGRADE PYELOGRAM AND STENT INSERTION
Anesthesia: General | Laterality: Right

## 2014-10-18 MED ORDER — IOHEXOL 300 MG/ML  SOLN
INTRAMUSCULAR | Status: DC | PRN
  Administered 2014-10-18: 14 mL

## 2014-10-18 MED ORDER — DEXAMETHASONE SODIUM PHOSPHATE 10 MG/ML IJ SOLN
INTRAMUSCULAR | Status: DC | PRN
  Administered 2014-10-18: 10 mg via INTRAVENOUS

## 2014-10-18 MED ORDER — SODIUM CHLORIDE 0.9 % IR SOLN
Status: DC | PRN
  Administered 2014-10-18: 5000 mL

## 2014-10-18 MED ORDER — MIDAZOLAM HCL 5 MG/5ML IJ SOLN
INTRAMUSCULAR | Status: DC | PRN
  Administered 2014-10-18: 2 mg via INTRAVENOUS

## 2014-10-18 MED ORDER — OXYCODONE HCL 5 MG PO TABS: 5.0000 mg | ORAL_TABLET | Freq: Once | ORAL | Status: DC | PRN

## 2014-10-18 MED ORDER — SCOPOLAMINE 1 MG/3DAYS TD PT72
MEDICATED_PATCH | TRANSDERMAL | Status: AC
  Filled 2014-10-18: qty 1

## 2014-10-18 MED ORDER — FENTANYL CITRATE 0.05 MG/ML IJ SOLN
INTRAMUSCULAR | Status: AC
  Filled 2014-10-18: qty 2

## 2014-10-18 MED ORDER — PROPOFOL 10 MG/ML IV BOLUS
INTRAVENOUS | Status: DC | PRN
  Administered 2014-10-18: 200 mg via INTRAVENOUS

## 2014-10-18 MED ORDER — PROMETHAZINE HCL 25 MG/ML IJ SOLN: 6.2500 mg | INTRAMUSCULAR | Status: DC | PRN

## 2014-10-18 MED ORDER — HYDROMORPHONE HCL 2 MG PO TABS
4.0000 mg | ORAL_TABLET | Freq: Once | ORAL | Status: AC
  Administered 2014-10-18: 4 mg via ORAL
  Filled 2014-10-18: qty 2

## 2014-10-18 MED ORDER — LIDOCAINE HCL 2 % EX GEL
CUTANEOUS | Status: AC
  Filled 2014-10-18: qty 10

## 2014-10-18 MED ORDER — SCOPOLAMINE 1 MG/3DAYS TD PT72
1.0000 | MEDICATED_PATCH | TRANSDERMAL | Status: DC
  Administered 2014-10-18: 1.5 mg via TRANSDERMAL
  Filled 2014-10-18: qty 1

## 2014-10-18 MED ORDER — ACETAMINOPHEN 10 MG/ML IV SOLN
1000.0000 mg | Freq: Once | INTRAVENOUS | Status: AC
  Administered 2014-10-18: 1000 mg via INTRAVENOUS
  Filled 2014-10-18 (×2): qty 100

## 2014-10-18 MED ORDER — HYDROMORPHONE HCL 1 MG/ML IJ SOLN
0.2500 mg | INTRAMUSCULAR | Status: DC | PRN
  Administered 2014-10-18 (×2): 0.5 mg via INTRAVENOUS

## 2014-10-18 MED ORDER — FENTANYL CITRATE 0.05 MG/ML IJ SOLN
INTRAMUSCULAR | Status: DC | PRN
  Administered 2014-10-18 (×2): 50 ug via INTRAVENOUS

## 2014-10-18 MED ORDER — MIDAZOLAM HCL 2 MG/2ML IJ SOLN
INTRAMUSCULAR | Status: AC
  Filled 2014-10-18: qty 2

## 2014-10-18 MED ORDER — LIDOCAINE HCL (CARDIAC) 20 MG/ML IV SOLN
INTRAVENOUS | Status: DC | PRN
  Administered 2014-10-18: 100 mg via INTRAVENOUS

## 2014-10-18 MED ORDER — PROPOFOL 10 MG/ML IV BOLUS
INTRAVENOUS | Status: AC
  Filled 2014-10-18: qty 20

## 2014-10-18 MED ORDER — OXYCODONE HCL 5 MG/5ML PO SOLN
5.0000 mg | Freq: Once | ORAL | Status: DC | PRN
  Filled 2014-10-18: qty 5

## 2014-10-18 MED ORDER — CIPROFLOXACIN IN D5W 400 MG/200ML IV SOLN
INTRAVENOUS | Status: AC
  Filled 2014-10-18: qty 200

## 2014-10-18 MED ORDER — CIPROFLOXACIN IN D5W 400 MG/200ML IV SOLN
400.0000 mg | INTRAVENOUS | Status: AC
  Administered 2014-10-18: 400 mg via INTRAVENOUS

## 2014-10-18 MED ORDER — ONDANSETRON HCL 4 MG/2ML IJ SOLN
INTRAMUSCULAR | Status: DC | PRN
  Administered 2014-10-18: 4 mg via INTRAVENOUS

## 2014-10-18 MED ORDER — FENTANYL CITRATE 0.05 MG/ML IJ SOLN
25.0000 ug | INTRAMUSCULAR | Status: DC | PRN
  Administered 2014-10-18: 50 ug via INTRAVENOUS
  Administered 2014-10-18 (×2): 25 ug via INTRAVENOUS

## 2014-10-18 MED ORDER — DEXAMETHASONE SODIUM PHOSPHATE 10 MG/ML IJ SOLN
INTRAMUSCULAR | Status: AC
  Filled 2014-10-18: qty 1

## 2014-10-18 MED ORDER — HYDROMORPHONE HCL 1 MG/ML IJ SOLN
INTRAMUSCULAR | Status: AC
  Filled 2014-10-18: qty 1

## 2014-10-18 MED ORDER — LACTATED RINGERS IV SOLN
INTRAVENOUS | Status: DC
  Administered 2014-10-18: 1000 mL via INTRAVENOUS

## 2014-10-18 MED ORDER — ONDANSETRON HCL 4 MG/2ML IJ SOLN
INTRAMUSCULAR | Status: AC
  Filled 2014-10-18: qty 2

## 2014-10-18 MED ORDER — LIDOCAINE HCL (CARDIAC) 20 MG/ML IV SOLN
INTRAVENOUS | Status: AC
  Filled 2014-10-18: qty 5

## 2014-10-18 SURGICAL SUPPLY — 21 items
BAG URO CATCHER STRL LF (DRAPE) ×3 IMPLANT
BASKET ZERO TIP NITINOL 2.4FR (BASKET) ×3 IMPLANT
CATH INTERMIT  6FR 70CM (CATHETERS) ×3 IMPLANT
CATH URET 5FR 28IN CONE TIP (BALLOONS) ×2
CATH URET 5FR 70CM CONE TIP (BALLOONS) ×1 IMPLANT
CLOTH BEACON ORANGE TIMEOUT ST (SAFETY) ×3 IMPLANT
DRAPE CAMERA CLOSED 9X96 (DRAPES) ×3 IMPLANT
FIBER LASER FLEXIVA 1000 (UROLOGICAL SUPPLIES) IMPLANT
FIBER LASER FLEXIVA 200 (UROLOGICAL SUPPLIES) ×3 IMPLANT
FIBER LASER FLEXIVA 365 (UROLOGICAL SUPPLIES) IMPLANT
FIBER LASER FLEXIVA 550 (UROLOGICAL SUPPLIES) IMPLANT
FIBER LASER TRAC TIP (UROLOGICAL SUPPLIES) IMPLANT
GLOVE BIOGEL M 7.0 STRL (GLOVE) ×3 IMPLANT
GOWN STRL REUS W/TWL LRG LVL3 (GOWN DISPOSABLE) ×6 IMPLANT
MANIFOLD NEPTUNE II (INSTRUMENTS) ×3 IMPLANT
NS IRRIG 1000ML POUR BTL (IV SOLUTION) IMPLANT
PACK CYSTO (CUSTOM PROCEDURE TRAY) ×3 IMPLANT
STENT POLARIS 5FRX24 (STENTS) ×3 IMPLANT
TUBING CONNECTING 10 (TUBING) ×2 IMPLANT
TUBING CONNECTING 10' (TUBING) ×1
WIRE COONS/BENSON .038X145CM (WIRE) ×3 IMPLANT

## 2014-10-18 NOTE — Op Note (Signed)
[name] is a 39 y.o.   10/18/2014  General  Preop diagnosis: Right distal ureteral calculus. Right hydronephrosis  Postop diagnosis: Same  Procedure done: Cystoscopy, right retrograde pyelogram, ureteroscopy, holmium laser right distal ureteral calculus, stone extraction, insertion of double-J stent  Surgeon: Charlene Brooke. [name]  Anesthesia: General.  Indication: Patient is a 39 years old female with a known history of kidney stone. She was seen in the emergency room on 10/13/2014 for low back pain and dysuria. She was discharged home with Cipro and phenazopyridine for presumed UTI. She had continued to have bladder pain and urgency. She was seen in the office on 1/13.   A CT scan showed a 7 mm stone in the right distal ureter with moderate to severe hydronephrosis. She is scheduled now for cystoscopy and stone manipulation  Procedure: Patient was identified by her wrist band and proper timeout was taken.  Under general anesthesia she was prepped and draped and placed in the dorsolithotomy position. A panendoscope was inserted in the bladder. The bladder mucosa is normal. There is no stone or tumor in the bladder. The ureteral orifices are normal position and shape.  Right retrograde pyelogram:  A cone-tip catheter was passed through the cystoscope and the right ureteral orifice. Contrast was injected through the cone-tip catheter. There is a filling defect in the distal ureter proximal to the UVJ. The ureter proximal to the filling defect is moderately dilated. The cone-tip catheter was removed. A sensor wire was passed through the cystoscope and the right ureter into the renal pelvis.  The cystoscope was removed. A semirigid ureteroscope was then passed in the bladder and the right ureteral orifice. The stone was identified in the distal ureter. With a 200 microfiber holmium laser the stone was fragmented in multiple smaller stone fragments. The stone fragments were then removed with  a Nitinol basket. There was no evidence of remaining stone fragments in the ureter.  Contrast was then injected through the ureteroscope. There is no evidence of extravasation of contrast. The ureteroscope was then removed.  Because of some edema of the ureter I thought it was prudent to place a stent. The sensor wire was then backloaded into the cystoscope and a #[id number] French-[id number] Polaris stent was passed over the sensor wire. The proximal curl of the double-J stent is in the renal pelvis and the loops of the stent are in the bladder. A string was left attached to the stent. The bladder was then emptied and the cystoscope removed.  Patient tolerated the procedure well and left the OR in satisfactory condition to postanesthesia care unit.

## 2014-10-18 NOTE — Progress Notes (Signed)
Report given to Kim

## 2014-10-18 NOTE — Progress Notes (Signed)
Pt continues to have discomfort "8"/ 10 after dilaudid PO. Notifiy Dr Janice Norrie.

## 2014-10-18 NOTE — Progress Notes (Signed)
No chest wray needed per Dr. Rodman Pickle

## 2014-10-18 NOTE — Progress Notes (Signed)
Spoke  With Dr. Janice Norrie re patient's c/o pain at 8 and that she is lying quietly on right side with eyes closed for long periods of time. Husband at bedside. Patient anxiety down and b/p down since arrival from PACU. Stated to give Dilaudid po and with any relief may be discharged. If not call back and he will come after office hours to see patient.

## 2014-10-18 NOTE — Progress Notes (Signed)
Bladder scan 16ml . Dr Janice Norrie at bedside to see Pt. Pt discharged home with husband.

## 2014-10-18 NOTE — Transfer of Care (Signed)
Immediate Anesthesia Transfer of Care Note  Patient: [name]  Procedure(s) Performed: Procedure(s): CYSTOSCOPY WITH RETROGRADE PYELOGRAM, URETEROSCOPY, STONE EXTRACTION AND STENT PLACEMENT (Right) HOLMIUM LASER APPLICATION (Right)  Patient Location: PACU  Anesthesia Type:General  Level of Consciousness: sedated  Airway & Oxygen Therapy: Patient Spontanous Breathing and Patient connected to face mask oxygen  Post-op Assessment: Report given to PACU RN and Post -op Vital signs reviewed and stable  Post vital signs: Reviewed and stable  Complications: No apparent anesthesia complications

## 2014-10-18 NOTE — Progress Notes (Signed)
Patient pulled out stent.

## 2014-10-18 NOTE — Progress Notes (Signed)
Patient c/o pain upon returning from [facility] pain level is 8 not bearable. Dr. Janice Norrie paged.

## 2014-10-18 NOTE — Discharge Instructions (Signed)
Drink Plenty of fluids, Rest remainder of day, light diet . Call Dr. Janice Norrie [phone] for fever 101 or greater, pain not controlled by medication. Persistent  nausea and vomiting . Unable to urinate, gross bloody urine. No driving for 24 hours no alcohol for 24 hours. Shower rather than tubs baths. Remove patch from behind left ear on Saturday.

## 2014-10-18 NOTE — Anesthesia Preprocedure Evaluation (Addendum)
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 Pt report stable size and no new symptoms.     GI/Hepatic negative GI ROS, Neg liver ROS,   Endo/Other  Morbid obesity  Renal/GU Hydronephrosis 2/2 UVJ stone     Musculoskeletal  (+) Arthritis -,   Abdominal   Peds  Hematology negative hematology ROS (+)   Anesthesia Other Findings   Reproductive/Obstetrics                            Anesthesia Physical Anesthesia Plan  ASA: III  Anesthesia Plan: General   Post-op Pain Management:    Induction: Intravenous  Airway Management Planned: LMA  Additional Equipment:   Intra-op Plan:   Post-operative Plan:   Informed Consent: I have reviewed the patients History and Physical, chart, labs and discussed the procedure including the risks, benefits and alternatives for the proposed anesthesia with the patient or authorized representative who has indicated his/her understanding and acceptance.   Dental advisory given  Plan Discussed with: CRNA  Anesthesia Plan Comments:        Anesthesia Quick Evaluation

## 2014-10-18 NOTE — H&P (Signed)
History of Present Illness 39 YO female patient of Dr. Sammie Bench seen today as a work-in for painful urination.    GU Hx:  Hx of nephrolithiasis with last CT urogram 2012. RUS: 2015 There are no masses or lesions seen bilaterally. There is a small non obstructing stone within the right kidney and two simple cysts in the left kidney. There is no hydronephrosis bilaterally. The bladder is not well seen.  She has been symptom free for several years.    Interval history May 2015:   Recently she has had bladder pressure, increased frequency, and dysuria. This began after a chemo treatment a week or so ago. She has been treated by her PCP with Cipro with no improvement. She presents today with ongoing symptoms despite antibiotic treatment. She denies flank pain or gross hematuria.     Jan 2016 Interval Hx:   Recently seen in ER 10/13/14 for low back pain and dysuria. Was discharged home with Cipro 500 mg 1 po BID X 7 days and Phenazopyridine 200 mg 1 po TID PRN.     Today states she has completed Cipro with no improvement of bladder pain/urgency. Takes chronic Hydromorphone 2 mg QID (8 mg/day) which is not controlling pain. Denies gross hematuria, flank pain, or dysuria. "This does not feel like a typical stone."   Past Medical History Problems  1. History of Anxiety (F41.9) 2. History of Asthma (J45.909) 3. History of brain tumor (Z87.898) 4. History of depression (Z86.59) 5. History of heartburn (Z87.898) 6. History of kidney stones (E99.371)  Surgical History Problems  1. History of Cesarean Section 2. History of Cholecystectomy 3. History of Craniotomy (Diagnostic) 4. History of Cystoscopy With Ureteroscopy With Manipulation Of Calculus 5. History of Gynecologic Surgery 6. History of Knee Surgery  Current Meds 1. Dilaudid TABS;  Therapy: (Recorded:13Jan2016) to Recorded 2. Lyrica 50 MG Oral Capsule;  Therapy: (Recorded:13Jan2016) to Recorded 3. Nasal Spray SOLN;  Busesonide;  Therapy: (Recorded:28May2015) to Recorded 4. Omega 3 CAPS;  Therapy: (Recorded:28May2015) to Recorded 5. Ondansetron HCl - 8 MG Oral Tablet;  Therapy: [id number] to Recorded 6. PriLOSEC 20 MG Oral Capsule Delayed Release;  Therapy: (Recorded:28May2015) to Recorded 7. Pulmicort Flexhaler 180 MCG/ACT INHA;  Therapy: (Recorded:25Aug2008) to Recorded 8. Pulmicort Flexhaler 180 MCG/ACT Inhalation Aerosol Powder Breath Activated;  Therapy: 03Sep2014 to Recorded 9. Senna Plus TABS;  Therapy: (Recorded:13Jan2016) to Recorded 10. Uribel 118 MG Oral Capsule; TAKE 1 CAPSULE 4 times daily PRN dysuria;   Therapy: [id number] to (Evaluate:12Jun2015); Last Rx:28May2015 Ordered 11. Zanaflex TABS;   Therapy: (Recorded:13Jan2016) to Recorded 12. ZyrTEC 10 MG CHEW;   Therapy: (Recorded:25Aug2008) to Recorded  Allergies Medication  1. Penicillins 2. Codeine Derivatives  Family History Problems  1. Family history of Blood In Urine : Father 2. Family history of Blood In Urine 3. Family history of Family Health Status - Father's Age : Father   38yrs 4. Family history of Family Health Status - Mother's Age : Mother   75yrs 5. Family history of Family Health Status Number Of Children : Maternal Grandmother   1 son/ 1 daug 6. Family history of Family Health Status Number Of Children   1 son and 1 daughter 2. Family history of Nephrolithiasis 8. Family history of Prostate Cancer : Paternal Grandfather  Social History Problems  1. Denied: History of Alcohol Use 2. Denied: History of Caffeine Use 3. Marital History - Currently Married 4. Never A Smoker 5. Occupation:   stay at home mom 6. Denied: History of Tobacco  Use  Review of Systems Genitourinary, constitutional, skin, eye, otolaryngeal, hematologic/lymphatic, cardiovascular, pulmonary, endocrine, musculoskeletal, gastrointestinal, neurological and psychiatric system(s) were reviewed and pertinent findings if present are  noted and are otherwise negative.  Genitourinary: urinary urgency, bladder pain and painful inability to urinate.    Vitals Vital Signs [Data Includes: Last 1 Day]  Recorded: [id number] 10:48AM  Blood Pressure: 108 / 74 Temperature: 97.9 F Heart Rate: 93  Physical Exam Constitutional: Well nourished and well developed . No acute distress. The patient appears well hydrated.  ENT:. The ears and nose are normal in appearance.  Neck: The appearance of the neck is normal.  Pulmonary: No respiratory distress.  Cardiovascular: Heart rate and rhythm are normal.  Abdomen: The abdomen is obese. Mild suprapubic tenderness is present. No CVA tenderness.  Skin: Normal skin turgor.  Neuro/Psych:. Mood and affect are appropriate.    Results/Data Urine [Data Includes: Last 1 Day]   [id number]  COLOR YELLOW   APPEARANCE CLEAR   SPECIFIC GRAVITY 1.010   pH 6.0   GLUCOSE NEG mg/dL  BILIRUBIN NEG   KETONE NEG mg/dL  BLOOD SMALL   PROTEIN NEG mg/dL  UROBILINOGEN 0.2 mg/dL  NITRITE NEG   LEUKOCYTE ESTERASE NEG   SQUAMOUS EPITHELIAL/HPF MODERATE   WBC 3-6 WBC/hpf  RBC 3-6 RBC/hpf  BACTERIA MODERATE   CRYSTALS NONE SEEN   CASTS NONE SEEN   Other MUCUS NOTED    Old records or history reviewed: Reviewed ER notes.  The following images/tracing/specimen were independently visualized:  CT urogram: shows small bilateral renal calculi. Moderate right hydronephrosis with hydroureter to level of distal right 6 mm ureteral calculus.  The following clinical lab reports were reviewed:  UA- appears contaminated with moderate epithelial cells. Small amount of microscopic hematuria.  PVR: Ultrasound PVR 0 ml. Selected Results  AU CT-STONE PROTOCOL [id number] 12:00AM [name]   Test Name Result Flag Reference  CT-STONE PROTOCOL (Report)    ** RADIOLOGY REPORT BY [facility] **   CLINICAL DATA: Microhematuria. Pelvic pain.  EXAM: CT ABDOMEN AND PELVIS WITHOUT CONTRAST (URINARY CALCULUS  PROTOCOL)  TECHNIQUE: Multidetector CT imaging was performed through the abdomen and pelvis without intravenous contrast to include the urinary tract.  COMPARISON: 07/01/2011.  FINDINGS: Lower chest: Lung bases show no acute findings. Heart size within normal limits. No pericardial or pleural effusion.  Hepatobiliary: Liver is unremarkable. Gallbladder appears to be surgically absent. No biliary ductal dilatation.  Pancreas: Negative.  Spleen: Negative.  Adrenals/Urinary Tract: Adrenal glands are unremarkable. There is right renal edema with moderate to severe right hydronephrosis secondary to a 7 mm stone in the distal right ureter, just proximal to the junction with the bladder. Stones are seen in the kidneys bilaterally. Low-attenuation lesions in the kidneys measure up to 10 mm on the right, similar to the prior exam, favoring cysts. Left ureter is decompressed. Bladder is low in volume.  Stomach/Bowel: Stomach and small bowel are unremarkable. Appendix is not readily visualized. Stool is seen in the majority of the colon.  Vascular/Lymphatic: Vascular structures are unremarkable. No pathologically enlarged lymph nodes.  Reproductive: Uterus and ovaries are visualized.  Other: Tiny pelvic free fluid. A shunt catheter terminates in the right lower quadrant. Mesenteries and peritoneum are otherwise unremarkable.  Musculoskeletal: No worrisome lytic or sclerotic lesions. Degenerative changes are seen in the spine.  IMPRESSION: 1. Moderate to severe right hydronephrosis secondary to a 7 mm stone in the distal right ureter. 2. Bilateral renal stones. 3. Stool in the  majority of the colon is indicative of constipation.   Electronically Signed  By: Lorin Picket M.D.  On: 10/16/2014 13:35   Assessment Assessed  1. Bladder pain (R39.89) 2. Urinary urgency (R39.15) 3. Microscopic hematuria (R31.2) 4. Calculus of distal right ureter (N20.1) 5. Difficult or  painful urination (R30.0) 6. Nephrolithiasis (N20.0) 7. Hydronephrosis, right (N13.30)  Plan  Bladder pain  1. PVR U/S; Status:Complete;   Done: [id number] Health Maintenance  2. UA With REFLEX; [Do Not Release]; Status:Complete;   Done: [id number] 10:34AM  Will continue with around the clock Hydromorphone she has on hand   May use OTC Ibuprofen PRN  Discussed with Dr. Janice Norrie- recommendation at this time is to proceed with elective cystourethroscopy, R RPG, stone extraction, and possible double J stent. Offered pt to have surgical intervention earlier today with on-call due to uncontrolled pain and she choose to proceed with Dr. Janice Norrie at this time. Risks and benefits of procedure discussed which include: general anesthesia, hematuria, risk of infection, or possible injury to bladder or ureter. Pt voices understanding and wishes to proceed

## 2014-10-18 NOTE — Anesthesia Postprocedure Evaluation (Signed)
  Anesthesia Post-op Note  Patient: [name]  Procedure(s) Performed: Procedure(s): CYSTOSCOPY WITH RETROGRADE PYELOGRAM, URETEROSCOPY, STONE EXTRACTION AND STENT PLACEMENT (Right) HOLMIUM LASER APPLICATION (Right)  Patient Location: PACU  Anesthesia Type:General  Level of Consciousness: awake and alert   Airway and Oxygen Therapy: Patient Spontanous Breathing  Post-op Pain: none  Post-op Assessment: Post-op Vital signs reviewed  Post-op Vital Signs: Reviewed  Last Vitals:  Filed Vitals:   10/18/14 1410  BP: 121/63  Pulse: 96  Temp:   Resp: 16    Complications: No apparent anesthesia complications

## 2014-10-21 ENCOUNTER — Encounter (HOSPITAL_COMMUNITY): Payer: Self-pay | Admitting: Urology

## 2014-11-21 DIAGNOSIS — G894 Chronic pain syndrome: Secondary | ICD-10-CM | POA: Insufficient documentation

## 2015-04-28 DIAGNOSIS — G893 Neoplasm related pain (acute) (chronic): Secondary | ICD-10-CM | POA: Insufficient documentation

## 2015-07-30 ENCOUNTER — Encounter: Payer: Self-pay | Admitting: Allergy and Immunology

## 2015-07-30 ENCOUNTER — Ambulatory Visit (INDEPENDENT_AMBULATORY_CARE_PROVIDER_SITE_OTHER): Payer: BLUE CROSS/BLUE SHIELD | Admitting: Allergy and Immunology

## 2015-07-30 VITALS — BP 118/80 | HR 80 | Temp 97.6°F | Resp 20 | Ht 67.0 in

## 2015-07-30 DIAGNOSIS — H1010 Acute atopic conjunctivitis, unspecified eye: Secondary | ICD-10-CM

## 2015-07-30 DIAGNOSIS — J4530 Mild persistent asthma, uncomplicated: Secondary | ICD-10-CM

## 2015-07-30 DIAGNOSIS — J309 Allergic rhinitis, unspecified: Secondary | ICD-10-CM | POA: Diagnosis not present

## 2015-07-30 MED ORDER — LEVALBUTEROL TARTRATE 45 MCG/ACT IN AERO: 1.0000 | INHALATION_SPRAY | RESPIRATORY_TRACT | Status: DC | PRN

## 2015-07-30 MED ORDER — BUDESONIDE 180 MCG/ACT IN AEPB: 2.0000 | INHALATION_SPRAY | Freq: Two times a day (BID) | RESPIRATORY_TRACT | Status: DC

## 2015-07-30 MED ORDER — BUDESONIDE 32 MCG/ACT NA SUSP: 1.0000 | Freq: Every day | NASAL | Status: DC

## 2015-07-30 NOTE — Progress Notes (Signed)
FOLLOW UP NOTE  RE: ANALISSE RANDLE MRN: [id number] DOB: [birth year] [facility] [place] [address] [place] [address] Date of Office Visit: 07/30/2015  Subjective:  [name] is a 39 y.o. female who presents today for Asthma   HPI: Gunda returns to the office in follow-up of asthma and allergic rhinoconjunctivitis.  Since her last visit in April, she reports feeling good.  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  She indicates her breathing is 100% though, in September she had increasing upper respiratory symptoms and cough with her son after being caught outside in the rain at a soccer game.  She used Xopenex once a day for several days with increased Pulmicort and did very well.  She denies any antibiotics or prednisone courses, difficulty breathing, shortness of breath, chest congestion, disrupted sleep or activity.  She is exercising regularly 3-5 miles on her indoor bike and walking recurrently when her children are at their outdoor activities.  She is pleased with how well she is done and feels medication regime is working well for her.  She requests 90 day refills today.  She denies any current runny nose, sneezing, itchy eyes, congestion, cough, wheeze or other chest concerns.  There have been no emergency department or urgent care visits since her last visit.  No new medical issues.    Current Medications: 1.  Pulmicort 185mcg 2 puffs twice daily. 2.  Zyrtec 10mg  once daily. 3.  Rhinocort 1-2 sprays each nostril once daily. 4.  Xopenex 2 puffs every 4 hours as needed. 5.  Daily Melatonin, Prilosec, Flexeril, Lyrica, Fish oil and Zofran.  Drug Allergies: Allergies  Allergen Reactions  . Penicillins Anaphylaxis  . Albuterol Other (See Comments)    Jittery   . Codeine Hives  . Compazine [Prochlorperazine Edisylate]     Panic attacks    Objective:   Filed Vitals:   07/30/15 1351  BP: 118/80  Pulse: 80  Temp: 97.6 F (36.4 C)  Resp: 20   Physical Exam  Constitutional: She is well-developed, well-nourished, and in no distress.  HENT:  Head: Atraumatic.  Right Ear: Tympanic membrane and ear canal normal.  Left Ear: Tympanic membrane and ear canal normal.  Nose: Mucosal edema present. No rhinorrhea. No epistaxis.  Mouth/Throat: Oropharynx is clear and moist and mucous membranes are normal. No oropharyngeal exudate, posterior oropharyngeal edema or posterior oropharyngeal erythema.  Neck: Neck supple.  Cardiovascular: Normal rate, S1 normal and S2 normal.   No murmur heard. Pulmonary/Chest: Effort normal. She has no wheezes. She has no rhonchi. She has no rales.  Lymphadenopathy:    She has no cervical adenopathy.    Diagnostics: FVC 2.61--71%, FEV1  2.44--81%.   Assessment:   1. Mild/Moderate persistent asthma, well controlled.   2. Allergic rhinoconjunctivitis   3.      Complex medical history--Astrocytoma.  Plan:  1.    Alexei will continue her current medication regime as it is working well for her. 2.    90 day printed scripts were given today. 3.    With any acute respiratory symptoms, she will increase Pulmicort to 4 times daily and use Xopenex 2 puffs every 4 hours as needed and call for an acute appointment. 4.    Follow-up in 6 months or sooner if needed.  Meds ordered this encounter  Medications  . budesonide (PULMICORT) 180 MCG/ACT inhaler    Sig: Inhale 2  puffs into the lungs 2 (two) times daily.    Dispense:  3 Inhaler    Refill:  1  . levalbuterol (XOPENEX HFA) 45 MCG/ACT inhaler    Sig: Inhale 1-2 puffs into the lungs every 4 (four) hours as needed for wheezing.    Dispense:  3 Inhaler    Refill:  0  . budesonide (RHINOCORT AQUA) 32 MCG/ACT nasal spray    Sig: Place 1 spray into both nostrils daily.    Dispense:  3 Bottle    Refill:  1       Stepehn Eckard M. Ishmael Holter, MD  cc: Carol Ada, MD

## 2015-09-03 ENCOUNTER — Telehealth: Payer: Self-pay

## 2015-09-03 MED ORDER — CETIRIZINE HCL 10 MG PO TABS: 10.0000 mg | ORAL_TABLET | Freq: Every day | ORAL | Status: DC

## 2015-09-03 NOTE — Telephone Encounter (Signed)
Sent in 90 day supply of zyrtec to patient pharmacy and patient notified.

## 2015-09-03 NOTE — Telephone Encounter (Signed)
Patient gets a 90 Day Supply of Zyrtec called in. Patient called pharmacy and was told to call us.  [facility] on [address]

## 2015-09-25 ENCOUNTER — Ambulatory Visit (INDEPENDENT_AMBULATORY_CARE_PROVIDER_SITE_OTHER): Payer: BLUE CROSS/BLUE SHIELD | Admitting: Allergy and Immunology

## 2015-09-25 ENCOUNTER — Encounter: Payer: Self-pay | Admitting: Allergy and Immunology

## 2015-09-25 VITALS — BP 116/70 | HR 72 | Temp 98.2°F | Resp 16

## 2015-09-25 DIAGNOSIS — J309 Allergic rhinitis, unspecified: Secondary | ICD-10-CM

## 2015-09-25 DIAGNOSIS — H1010 Acute atopic conjunctivitis, unspecified eye: Secondary | ICD-10-CM | POA: Diagnosis not present

## 2015-09-25 DIAGNOSIS — J4531 Mild persistent asthma with (acute) exacerbation: Secondary | ICD-10-CM | POA: Diagnosis not present

## 2015-09-25 MED ORDER — LEVALBUTEROL HCL 1.25 MG/3ML IN NEBU
1.2500 mg | INHALATION_SOLUTION | Freq: Once | RESPIRATORY_TRACT | Status: AC
  Administered 2015-09-25: 1.25 mg via RESPIRATORY_TRACT

## 2015-09-25 NOTE — Patient Instructions (Addendum)
   Prednisone 30mg  today.   Have available additional 20mg  once daily for 3 days on hold.  Continue Pulmicort at 4 puffs twice daily.  Rhinocort AQ 1-2 sprays once daily.  Zyrtec 10mg  once daily.  Saline nasal wash each evening at showertime.  Xopenex 2 puffs every 4 hours as needed for cough or wheeze.  Keep follow-up as scheduled or sooner if needed.

## 2015-09-25 NOTE — Progress Notes (Signed)
FOLLOW UP NOTE  RE: SUMAIA VELADOR MRN: [id number] DOB: 1976-09-03 [facility] [address] [place] [address] Date of Office Visit: 09/25/2015  Subjective:  [name] is a 39 y.o. female who presents today for Asthma  Assessment:   1. Mild persistent asthma, with acute exacerbation.   2.      Allergic rhinoconjunctivitis. 3.      Probable viral upper respiratory infection trigger, resolving--afebrile in no respiratory distress. Plan:   Meds ordered this encounter  Medications  . levalbuterol (XOPENEX) nebulizer solution 1.25 mg    Sig:    Patient Instructions  1.  Zoriana received Prednisone 30mg  today--she prefers to minimize additional prednisone.  However, will have available additional 20mg  once daily for 3 days on hold given holiday weekend. 2.  Continue Pulmicort at 4 puffs twice daily, rinse mouth after use. 3.  Rhinocort AQ 1-2 sprays once daily. 4.  Zyrtec 10mg  once daily. 5.  Saline nasal wash each evening at showertime. 6.  Xopenex 2 puffs every 4 hours as needed for cough or wheeze. 7.  Contessa will call with any additional questions or concerns and she is aware of her 24 hour on call physician availability. 8.  Keep follow-up as scheduled or sooner if needed.  HPI: Craig returns to the office with intermittent symptoms over the last 4 days.  She reports her son had acute cold-like symptoms and probably contributed to her symptoms.  Jamilet notes cough, wheeze, slight congestion, including her chest now using Xopenex once a day and increased the Pulmicort to 4 times a day in the last 48 hours.  She reports no difficulty in breathing, shortness of breath, headache, fever, sore throat, rhinorrhea, congestion or sneezing, but is being consistent with her other medications.  Denies ED or urgent care visits, prednisone or antibiotic courses. Reports sleep and activity are normal.  Current Medications: 1.   Pulmicort 180 g 2 puffs twice daily--increased to 4 times daily. 2.  Rhinocort AQ 12 sprays once daily. 3.  Zyrtec 10 mg once daily. 4.  Xopenex HFA as needed. 5.  Prilosec, fish oil, Lunesta, Flexeril, Lyrica and melatonin. 6.  Zofran and Dilaudid as needed.  Drug Allergies: Allergies  Allergen Reactions  . Penicillins Anaphylaxis  . Albuterol Other (See Comments)    Jittery   . Codeine Hives  . Compazine [Prochlorperazine Edisylate]     Panic attacks   Objective:   Filed Vitals:   09/25/15 1729  BP: 116/70  Pulse: 72  Temp: 98.2 F (36.8 C)  Resp: 16   SpO2 Readings from Last 1 Encounters:  09/25/15 97%   Physical Exam  Constitutional: She is well-developed, well-nourished, and in no distress.  HENT:  Head: Atraumatic.  Right Ear: Tympanic membrane and ear canal normal.  Left Ear: Tympanic membrane and ear canal normal.  Nose: Mucosal edema present. No rhinorrhea. No epistaxis.  Mouth/Throat: Oropharynx is clear and moist and mucous membranes are normal. No oropharyngeal exudate, posterior oropharyngeal edema or posterior oropharyngeal erythema.  Neck: Neck supple.  Cardiovascular: Normal rate, S1 normal and S2 normal.   No murmur heard. Pulmonary/Chest: Effort normal. She has no wheezes. She has no rhonchi. She has no rales.  Post Xopenex neb--patient reports improved aeration with continued clear breath sounds without wheeze, rhonchi or crackles.  Lymphadenopathy:    She has no cervical adenopathy.   Diagnostics: Spirometry:  FVC 2.63--72%,  FEV1 2.33-77%; essentially no change postbronchodilator.  Katrice Goel M. Ishmael Holter, MD  cc: Reginia Naas, MD

## 2015-09-30 ENCOUNTER — Other Ambulatory Visit: Payer: Self-pay | Admitting: *Deleted

## 2015-09-30 MED ORDER — LEVALBUTEROL TARTRATE 45 MCG/ACT IN AERO: 1.0000 | INHALATION_SPRAY | RESPIRATORY_TRACT | Status: DC | PRN

## 2015-11-28 ENCOUNTER — Other Ambulatory Visit: Payer: Self-pay | Admitting: Allergy and Immunology

## 2016-01-02 ENCOUNTER — Other Ambulatory Visit: Payer: Self-pay | Admitting: Allergy and Immunology

## 2016-01-23 ENCOUNTER — Encounter: Payer: Self-pay | Admitting: Allergy and Immunology

## 2016-01-23 ENCOUNTER — Ambulatory Visit (INDEPENDENT_AMBULATORY_CARE_PROVIDER_SITE_OTHER): Payer: BLUE CROSS/BLUE SHIELD | Admitting: Allergy and Immunology

## 2016-01-23 VITALS — BP 120/75 | HR 78 | Temp 98.0°F | Resp 18

## 2016-01-23 DIAGNOSIS — J3089 Other allergic rhinitis: Secondary | ICD-10-CM

## 2016-01-23 DIAGNOSIS — J0110 Acute frontal sinusitis, unspecified: Secondary | ICD-10-CM | POA: Diagnosis not present

## 2016-01-23 DIAGNOSIS — J45901 Unspecified asthma with (acute) exacerbation: Secondary | ICD-10-CM | POA: Diagnosis not present

## 2016-01-23 DIAGNOSIS — J019 Acute sinusitis, unspecified: Secondary | ICD-10-CM | POA: Insufficient documentation

## 2016-01-23 MED ORDER — AZITHROMYCIN 250 MG PO TABS: ORAL_TABLET | ORAL | Status: DC

## 2016-01-23 MED ORDER — PREDNISONE 1 MG PO TABS: 10.0000 mg | ORAL_TABLET | ORAL | Status: DC

## 2016-01-23 MED ORDER — METHYLPREDNISOLONE ACETATE 80 MG/ML IJ SUSP
80.0000 mg | Freq: Once | INTRAMUSCULAR | Status: AC
  Administered 2016-01-23: 80 mg via INTRAMUSCULAR

## 2016-01-23 NOTE — Assessment & Plan Note (Signed)
   Depo-Medrol 80 mg was administered in the office.  Prednisone has been provided and is to be started tomorrow as follows: 20 mg daily x 4 days, 10 mg x1 day, then stop.  For now, and during all asthma flares, continue Pulmicort Flexhaler 180 g, 2 inhalations 4 times per day.  After symptoms have resolved, she may resume her previous dose of 2 inhalations twice a day.  Continue Xopenex HFA, 1-2 inhalations every 4-6 hours as needed.

## 2016-01-23 NOTE — Assessment & Plan Note (Signed)
   Depo-Medrol and prednisone have been provided (as above).  Continue Rhinocort AQ and nasal saline irrigation as needed.  Guaifenesin 1200 mg twice daily as needed with adequate hydration as discussed.   The patient has been asked to contact me if her symptoms persist, progress, or if she becomes febrile. Otherwise, she may return for follow up in 4 months.

## 2016-01-23 NOTE — Assessment & Plan Note (Signed)
   Continue appropriate allergen avoidance measures, Rhinocort AQ as needed, and nasal saline irrigation as needed.

## 2016-01-23 NOTE — Patient Instructions (Addendum)
Asthma with acute exacerbation  Depo-Medrol 80 mg was administered in the office.  Prednisone has been provided and is to be started tomorrow as follows: 20 mg daily x 4 days, 10 mg x1 day, then stop.  For now, and during all asthma flares, continue Pulmicort Flexhaler 180 g, 2 inhalations 4 times per day.  After symptoms have resolved, she may resume her previous dose of 2 inhalations twice a day.  Continue Xopenex HFA, 1-2 inhalations every 4-6 hours as needed.  Acute sinusitis  Depo-Medrol and prednisone have been provided (as above).  Continue Rhinocort AQ and nasal saline irrigation as needed.  Guaifenesin 1200 mg twice daily as needed with adequate hydration as discussed.   The patient has been asked to contact me if her symptoms persist, progress, or if she becomes febrile. Otherwise, she may return for follow up in 4 months.  Allergic rhinitis  Continue appropriate allergen avoidance measures, Rhinocort AQ as needed, and nasal saline irrigation as needed.    Return in about 4 months (around 05/24/2016), or if symptoms worsen or fail to improve.

## 2016-01-23 NOTE — Progress Notes (Signed)
Follow-up Note  RE: [name] MRN: [id number] DOB: May 21, 1976 Date of Office Visit: 01/23/2016  Primary care provider: Reginia Naas, MD Referring provider: Carol Ada, MD  History of present illness: HPI Comments: [name] is a 40 y.o. female persistent asthma and allergic rhinitis who presents today for sick visit.  She was last seen in this clinic on 09/25/2015.  She reports that she went to [place] last week and stayed in a hotel room with a moldy window air conditioning unit.  By the next morning, she began to experience coughing, chest tightness, and wheezing requiring increased Xopenex rescue.  In addition, she developed sinus pressure over the forehead, nasal congestion, thick postnasal drainage, and sore throat.  She denies fevers or chills.  Over the past week, her symptoms have progressed despite having increased her Pulmicort 180 g to 2 inhalations 4 times per day and using nasal saline irrigation 2 or 3 times per day.   Assessment and plan: Asthma with acute exacerbation  Depo-Medrol 80 mg was administered in the office.  Prednisone has been provided and is to be started tomorrow as follows: 20 mg daily x 4 days, 10 mg x1 day, then stop.  For now, and during all asthma flares, continue Pulmicort Flexhaler 180 g, 2 inhalations 4 times per day.  After symptoms have resolved, she may resume her previous dose of 2 inhalations twice a day.  Continue Xopenex HFA, 1-2 inhalations every 4-6 hours as needed.  Acute sinusitis  Depo-Medrol and prednisone have been provided (as above).  Continue Rhinocort AQ and nasal saline irrigation as needed.  Guaifenesin 1200 mg twice daily as needed with adequate hydration as discussed.   The patient has been asked to contact me if her symptoms persist, progress, or if she becomes febrile. Otherwise, she may return for follow up in 4 months.  Allergic rhinitis  Continue appropriate allergen  avoidance measures, Rhinocort AQ as needed, and nasal saline irrigation as needed.    Meds ordered this encounter  Medications  . methylPREDNISolone acetate (DEPO-MEDROL) injection 80 mg    Sig:   . azithromycin (ZITHROMAX Z-PAK) 250 MG tablet    Sig: Take 2 tablets on day One and 1 tablet on days Two thur Four.    Dispense:  6 each    Refill:  0  . predniSONE (DELTASONE) tablet 10 mg    Sig:     Diagnositics: Spirometry reveals an FVC of 1.99 L and an FEV1 of 1.99 L (66% predicted) without post bronc dilator improvement.  Please see scanned spirometry results for details.    Physical examination: Blood pressure 120/75, pulse 78, temperature 98 F (36.7 C), temperature source Oral, resp. rate 18, SpO2 98 %.  General: Alert, interactive, in no acute distress. HEENT: TMs pearly gray, turbinates edematous with thick discharge, post-pharynx erythematous. Neck: Supple without lymphadenopathy. Lungs: Mildly decreased breath sounds bilaterally without wheezing, rhonchi or rales. CV: Normal S1, S2 without murmurs. Skin: Warm and dry, without lesions or rashes.  The following portions of the patient's history were reviewed and updated as appropriate: allergies, current medications, past family history, past medical history, past social history, past surgical history and problem list.    Medication List       This list is accurate as of: 01/23/16  3:03 PM.  Always use your most recent med list.               azithromycin 250 MG tablet  Commonly known  as:  ZITHROMAX Z-PAK  Take 2 tablets on day One and 1 tablet on days Two thur Four.     budesonide 180 MCG/ACT inhaler  Commonly known as:  PULMICORT  Inhale 2 puffs into the lungs 2 (two) times daily.     budesonide 32 MCG/ACT nasal spray  Commonly known as:  RHINOCORT AQUA  Place 1 spray into both nostrils daily.     cetirizine 10 MG tablet  Commonly known as:  ZYRTEC  Take one tablet once daily for runny nose or itching.       cyclobenzaprine 10 MG tablet  Commonly known as:  FLEXERIL  Take 10 mg by mouth every 12 (twelve) hours. Reported on 01/23/2016     Eszopiclone 3 MG Tabs  Take 3 mg by mouth at bedtime as needed. Reported on 01/23/2016     fish oil-omega-3 fatty acids 1000 MG capsule  Take 1 g by mouth at bedtime. Reported on 01/23/2016     HYDROmorphone 8 MG tablet  Commonly known as:  DILAUDID  Take 8 mg by mouth every 4 (four) hours as needed for severe pain (Kidney Stones). Reported on 01/23/2016     multivitamin tablet  Take by mouth.     nabumetone 750 MG tablet  Commonly known as:  RELAFEN  Take 750 mg by mouth 2 (two) times daily with a meal.     omeprazole 20 MG tablet  Commonly known as:  PRILOSEC OTC  Take 20 mg by mouth at bedtime.     ondansetron 4 MG tablet  Commonly known as:  ZOFRAN  Take 4 mg by mouth every 8 (eight) hours as needed for nausea or vomiting. Reported on 01/23/2016     ondansetron 8 MG tablet  Commonly known as:  ZOFRAN  Take by mouth as needed for nausea or vomiting.     pregabalin 50 MG capsule  Commonly known as:  LYRICA  Take 100 mg by mouth 3 (three) times daily. Reported on 01/23/2016     SLEEP PO  Take 1 tablet by mouth at bedtime. Reported on 01/23/2016     XOPENEX HFA 45 MCG/ACT inhaler  Generic drug:  levalbuterol  USE 1 TO 2 INHALATIONS EVERY 4 HOURS AS NEEDED FOR WHEEZING        Allergies  Allergen Reactions  . Penicillins Anaphylaxis  . Albuterol Other (See Comments)    Jittery   . Codeine Hives  . Compazine [Prochlorperazine Edisylate]     Panic attacks  . Midazolam Itching   Review of systems: Constitutional: Negative for fever, chills and weight loss.  HENT: Negative for nosebleeds.   Positive for sinus pressure, nasal congestion, postnasal drainage. Eyes: Negative for blurred vision.  Respiratory: Negative for hemoptysis.   Positive for coughing, dyspnea, chest tightness, and wheezing. Cardiovascular: Negative for chest pain.   Gastrointestinal: Negative for diarrhea and constipation.  Genitourinary: Negative for dysuria.  Musculoskeletal: Negative for myalgias and joint pain.  Neurological: Negative for dizziness.  Endo/Heme/Allergies: Does not bruise/bleed easily.   Past Medical History  Diagnosis Date  . Migraine   . Kidney stone   . Brain tumor ([place])   . 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 Asthma     asthma since a child. Well controlled  . Pneumonia     last time 2 yr ago  . Anxiety   . Arthritis     arthritis in knees  . 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 Allergies Father   . Heart disease Mother   . Clotting disorder Mother   . Heart disease Maternal Grandmother   . Heart disease Paternal Grandmother   . Cancer      grandparents  . Urticaria Neg Hx   . Immunodeficiency Neg Hx   . Eczema Neg Hx   . Atopy Neg Hx   . Asthma Neg Hx   . Angioedema Neg Hx   . Allergic rhinitis Neg Hx     Social History   Social History  . Marital Status: Married    Spouse Name: N/A  . Number of Children: N/A  . Years of Education: N/A   Occupational History  . homemaker    Social History Main Topics  . Smoking status: Never Smoker   . Smokeless tobacco: Never Used  . Alcohol Use: No  . Drug Use: No  . Sexual Activity: Yes    Birth Control/ Protection: Surgical   Other Topics Concern  . Not on file   Social History Narrative    I appreciate the opportunity to take part in this Creola's care. Please do not hesitate to contact me with questions.  Sincerely,   R. Edgar Frisk, MD

## 2016-04-01 ENCOUNTER — Other Ambulatory Visit: Payer: Self-pay | Admitting: Allergy and Immunology

## 2016-05-06 ENCOUNTER — Other Ambulatory Visit: Payer: Self-pay | Admitting: Allergy and Immunology

## 2016-05-14 ENCOUNTER — Other Ambulatory Visit: Payer: Self-pay | Admitting: Allergy and Immunology

## 2016-06-08 ENCOUNTER — Telehealth: Payer: Self-pay | Admitting: Allergy and Immunology

## 2016-06-08 NOTE — Telephone Encounter (Signed)
Called patient and advised her budesonide nasal not available has to use OTC Rhinocort can go online and get coupone

## 2016-06-08 NOTE — Telephone Encounter (Signed)
She needs a refill of her Budesonide nasal spray 32 mcg. Can we please call it to [facility] on [address]. She would like all her prescriptions sent to this pharmacy from now on please.

## 2016-06-29 ENCOUNTER — Telehealth: Payer: Self-pay | Admitting: Allergy and Immunology

## 2016-06-29 NOTE — Telephone Encounter (Signed)
Tried calling pt but no voicemail was set up 

## 2016-06-29 NOTE — Telephone Encounter (Signed)
Patient called an says she is congested in her head and chest. Tried getting in with her primary today, but they don't have nebulizers. She made an appt with Dr. Ernst Bowler tomorrow at 10:45, but wants to know if you can suggest any relief for her before then.

## 2016-06-29 NOTE — Telephone Encounter (Signed)
I would recommend nasal saline rinses twice daily and then administer one spray of Rhinocort per nostril twice daily. She can also use Mucinex, which is available over the counter, to help thin secretions.  Thanks, Salvatore Marvel, MD [facility]

## 2016-06-29 NOTE — Telephone Encounter (Signed)
Please advise 

## 2016-06-30 ENCOUNTER — Encounter: Payer: Self-pay | Admitting: Allergy & Immunology

## 2016-06-30 ENCOUNTER — Ambulatory Visit (INDEPENDENT_AMBULATORY_CARE_PROVIDER_SITE_OTHER): Payer: BLUE CROSS/BLUE SHIELD | Admitting: Allergy & Immunology

## 2016-06-30 VITALS — BP 90/50 | HR 72 | Resp 20

## 2016-06-30 DIAGNOSIS — J4541 Moderate persistent asthma with (acute) exacerbation: Secondary | ICD-10-CM | POA: Diagnosis not present

## 2016-06-30 DIAGNOSIS — J310 Chronic rhinitis: Secondary | ICD-10-CM | POA: Diagnosis not present

## 2016-06-30 DIAGNOSIS — J019 Acute sinusitis, unspecified: Secondary | ICD-10-CM | POA: Diagnosis not present

## 2016-06-30 MED ORDER — BUDESONIDE 180 MCG/ACT IN AEPB: 2.0000 | INHALATION_SPRAY | Freq: Two times a day (BID) | RESPIRATORY_TRACT | 3 refills | Status: DC

## 2016-06-30 MED ORDER — CLARITHROMYCIN 500 MG PO TABS: 500.0000 mg | ORAL_TABLET | Freq: Two times a day (BID) | ORAL | 0 refills | Status: AC

## 2016-06-30 MED ORDER — METHYLPREDNISOLONE ACETATE 80 MG/ML IJ SUSP
80.0000 mg | Freq: Once | INTRAMUSCULAR | Status: AC
  Administered 2016-06-30: 80 mg via INTRAMUSCULAR

## 2016-06-30 MED ORDER — CLARITHROMYCIN 500 MG PO TABS: 500.0000 mg | ORAL_TABLET | Freq: Two times a day (BID) | ORAL | 0 refills | Status: DC

## 2016-06-30 NOTE — Patient Instructions (Addendum)
1. Moderate persistent asthma, with acute exacerbation - Continue with Pulmicort 2 puffs twice daily. - You can increase for the next week as you are doing now.  - Continue your prednisone course obtained from Urgent Care. - We will get some lab work to see if you qualify for an injectable medication: CBC with differential, IgE level  2. Chronic rhinitis - Continue with nasal saline rinses. - Start the Biaxin 500mg  twice daily for 14 days if you do not feel better in the next day or two.  - Continue nasal steroids and   3. Return in about 2 months (around 08/30/2016).  Please inform us of any Emergency Department visits, hospitalizations, or changes in symptoms. Call us before going to the ED for breathing or allergy symptoms since we might be able to fit you in for a sick visit. Feel free to contact us anytime with any questions, problems, or concerns.  It was a pleasure to meet you today!   Websites that have reliable patient information: 1. American Academy of Asthma, Allergy, and Immunology: www.aaaai.org 2. Food Allergy Research and Education (FARE): foodallergy.org 3. Mothers of Asthmatics: http://www.asthmacommunitynetwork.org 4. American College of Allergy, Asthma, and Immunology: www.acaai.org

## 2016-06-30 NOTE — Progress Notes (Addendum)
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 Moderate persistent asthma, with acute exacerbation - Continue the increase in Pulmicort 2 puffs four times daily for one more week, then decrease back to Pulmicort 2 puffs twice daily. - Continue your prednisone course obtained from Urgent Care. - DuoNeb nebulizer treatment provided in clinic with significant improvement in lung function. - Depo-Medrol 80 mg given in clinic today. - We will get some lab work to see if you qualify for an injectable medication: CBC with differential, IgE level - She continues to have fairly significant restrictive disease today, therefore I would consider additional workup at the next visit if this persists (full pulmonary function testing, chest CT).  2. Chronic rhinitis - Continue with nasal saline rinses. - Start the Biaxin 500mg  twice daily for 14 days if you do not feel better in the next day or two.  - Continue nasal steroids and antihistamines.   3. Return in about 2 months (around 08/30/2016).    Subjective:   [name] is a 40 y.o. female presenting today for follow up of  Chief Complaint  Patient presents with  . Asthma  .  [name] has a history of the following: Patient Active Problem List   Diagnosis Date Noted  . Asthma with acute exacerbation 01/23/2016  .  Acute sinusitis 01/23/2016  . Allergic rhinitis 01/23/2016  . ILD (interstitial lung disease) ([place]) 07/30/2012  . Dyspnea 07/16/2012    History obtained from: chart review and patient.  Stephani Police Trachtenberg was referred by Reginia Naas, MD.     [name] is a 40 y.o. female presenting for a sick visit for asthma and allergies. [name] was last seen in April 2017 by Dr. Verlin Fester. At that time, she was diagnosed with an asthma exacerbation and started on a prednisone course. She was instructed to continue her Pulmicort Flexhaler 180 g 2 inhalations 4 times per day (normally 2 inhalations twice daily). She was also treated for sinusitis with nasal steroid as well as Mucinex. It does not appear that she received an antibiotic at that time. She had skin testing in 2003 that was positive to pollens, weeds, trees, dust mite, cat, dog, and some molds.   Since last visit, she has done well until approximately this past Saturday (4 days ago). At that time, she started having a sore throat. The symptoms worsened on Sunday. Then on Monday and Tuesday she started having shortness of breath and chest pain. She did increase the use of her rescue inhaler. She also increased her Pulmicort to 2 inhalations 4 times per day. Albuterol was helping initially, but seemed to be less effective as of late. She did go to urgent care yesterday, and was started on prednisone 20 mg twice a day for 7 days. She has having some nasal congestion and sinus pressure as well as increased work of breathing.  [name]'s asthma is otherwise fairly well controlled according to her. She estimates that  she seems prednisone for exacerbations 2-3 times per year. Between exacerbations, she does not have any asthma symptoms, although upon further questioning it appears that she might have some nighttime coughing that she is not connecting to asthma. He does not use her rescue inhaler very often. She is very comfortable with Pulmicort and  refuses to consider any other inhalers at this point.  [name] does have a history of GERD. 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 She no longer follows with pulmonologist. Review of his note from October 2013 shows that he felt that the pulmonary fibrosis noted on the chest CT was actually dependent atelectasis or evidence of microaspiration. Evidently, the changes had remained stable since an abdominal and chest CT performed in 2011. I'm unable to see these in our system, however.   Asthma history (obtained from paper chart): [name] first saw [name] in June 2003 when she was 40 years old. She had just moved from [place] at the time. She had asthma as a child that seemed to resolve between 77 and 5 years of age. When we first saw her, she was on Serovent one puff BID As well as Pulmicort two puffs BID. She has been on multiple antihistamines and nasal sprays over the years. She was started on allergy immunotherapy in 2003 and had a systemic reaction in June 2003 and February 2004. She tends to miss appointments when she is doing well. Overall her frequency of exacerbations has been around 2-4 per year on average. She had a particularly severe episode in September 2011 at which time she was diagnosed with pneumonia and transferred to Urgent Care for management, She was referred to Pulmonology in October 2013 (Dr. Chase Caller). A chest CT was consistent with mild chronic interstitial fibrosis and stable bilateral granulomata without opacities. There was no etiology to explain these findings. Restrictive disease started showing up in 2013 on spirometry. Repeat chest CT in 2014 was stable and there does not appear to have been another one since that time.      Otherwise, there have been no changes to the  past medical history, surgical history, family history, or social history.     Review of Systems: a 14-point review of systems is pertinent for what is mentioned in HPI.  Otherwise, all other systems were negative. Constitutional: negative other than that listed in the HPI Eyes: negative other than that listed in the HPI Ears, nose, mouth, throat, and face: negative other than that listed in the HPI Respiratory: negative other than that listed in the HPI Cardiovascular: negative other than that listed in the HPI Gastrointestinal: negative other than that listed in the HPI Genitourinary: negative other than that listed in the HPI Integument: negative other than that listed in the HPI Hematologic: negative other than that listed in the HPI Musculoskeletal: negative other than that listed in the HPI Neurological: negative other than that listed in the HPI Allergy/Immunologic: negative other than that listed in the HPI    Objective:   Blood pressure (!) 90/50, pulse 72, resp. rate 20. There is no height or weight on file to calculate BMI.   **Patient was having no signs of sepsis aside from the low blood pressure. She had excellent capillary refill and perfusion. She has no altered mental status.  According to the patient, she always has low blood pressure. Reviewed previous notes and her systolic ranges from the low 100s to 123456 and her diastolic ranges from 0000000 to 80s.**   Physical Exam:  General: Alert, interactive, in no acute distress. Cooperative with the exam. Pleasant. Able to complete whole sentences.  HEENT: TMs pearly gray, turbinates edematous and pale with clear discharge, post-pharynx erythematous. Neck: Supple without thyromegaly. Lungs: Decreased breath sounds with expiratory wheezing bilaterally. Increased work of breathing. CV: Normal S1, S2 without murmurs. Capillary refill <2 seconds.  Abdomen: Nondistended, nontender. No guarding or rebound tenderness. Skin: Warm  and dry, without lesions or rashes. Extremities:  No clubbing, cyanosis or edema. Neuro:   Grossly intact.   Diagnostic studies:  Spirometry: results abnormal (FEV1: 1.59/49%%, FVC: 1.93/48%%, FEV1/FVC: 82%%).   Spirometry consistent with moderate restrictive disease. We did give her a DuoNeb nebulizer treatment in clinic. Her FVC increased 490 mL (12%) and her FEV1 increased 720 mL (22%). Her FEF 25-75 increased 23%.   Salvatore Marvel, MD [facility]  ------------------------------------------------------------------------------------------------------------------------------------------------------------------------  Addendum note (07/01/2016): I did contact the patient this morning to ensure that she was still feeling well. She reports that her symptoms have not improved. She is going to start the antibiotic today. With regards to her low blood pressure, she did not seem concerned. She reports that it is typically in the range when she sees her primary care physician. She declines coming in for a blood pressure rechecked, but I did review indications for seeking additional help. Patient is in agreement with the plan.  Salvatore Marvel, MD [facility] [place]

## 2016-08-04 LAB — CBC WITH DIFFERENTIAL/PLATELET
BASOS ABS: 0 {cells}/uL (ref 0–200)
Basophils Relative: 0 %
EOS ABS: 0 {cells}/uL — AB (ref 15–500)
Eosinophils Relative: 0 %
HEMATOCRIT: 38.6 % (ref 35.0–45.0)
HEMOGLOBIN: 13 g/dL (ref 11.7–15.5)
LYMPHS ABS: 2212 {cells}/uL (ref 850–3900)
Lymphocytes Relative: 28 %
MCH: 31 pg (ref 27.0–33.0)
MCHC: 33.7 g/dL (ref 32.0–36.0)
MCV: 92.1 fL (ref 80.0–100.0)
MONO ABS: 869 {cells}/uL (ref 200–950)
MPV: 8.6 fL (ref 7.5–12.5)
Monocytes Relative: 11 %
NEUTROS ABS: 4819 {cells}/uL (ref 1500–7800)
NEUTROS PCT: 61 %
Platelets: 222 10*3/uL (ref 140–400)
RBC: 4.19 MIL/uL (ref 3.80–5.10)
RDW: 14.2 % (ref 11.0–15.0)
WBC: 7.9 10*3/uL (ref 3.8–10.8)

## 2016-08-05 LAB — IGE: IgE (Immunoglobulin E), Serum: 7 kU/L (ref <115)

## 2016-08-09 ENCOUNTER — Encounter: Payer: Self-pay | Admitting: Allergy & Immunology

## 2016-10-26 ENCOUNTER — Emergency Department (HOSPITAL_COMMUNITY): Payer: BLUE CROSS/BLUE SHIELD | Admitting: Certified Registered"

## 2016-10-26 ENCOUNTER — Encounter (HOSPITAL_COMMUNITY)
Admission: EM | Disposition: A | Discharge status: Home / Self Care | Payer: Self-pay | Source: Home / Self Care | Attending: Emergency Medicine

## 2016-10-26 ENCOUNTER — Encounter (HOSPITAL_COMMUNITY): Payer: Self-pay | Admitting: Emergency Medicine

## 2016-10-26 ENCOUNTER — Emergency Department (HOSPITAL_COMMUNITY): Payer: BLUE CROSS/BLUE SHIELD

## 2016-10-26 ENCOUNTER — Emergency Department (HOSPITAL_COMMUNITY)
Admission: EM | Admit: 2016-10-26 | Discharge: 2016-10-26 | Disposition: A | Discharge status: Home / Self Care | Payer: BLUE CROSS/BLUE SHIELD | Attending: Emergency Medicine | Admitting: Emergency Medicine

## 2016-10-26 DIAGNOSIS — Z832 Family history of diseases of the blood and blood-forming organs and certain disorders involving the immune mechanism: Secondary | ICD-10-CM | POA: Insufficient documentation

## 2016-10-26 DIAGNOSIS — Z923 Personal history of irradiation: Secondary | ICD-10-CM | POA: Diagnosis not present

## 2016-10-26 DIAGNOSIS — Z6830 Body mass index (BMI) 30.0-30.9, adult: Secondary | ICD-10-CM | POA: Insufficient documentation

## 2016-10-26 DIAGNOSIS — Z9049 Acquired absence of other specified parts of digestive tract: Secondary | ICD-10-CM | POA: Diagnosis not present

## 2016-10-26 DIAGNOSIS — Z9221 Personal history of antineoplastic chemotherapy: Secondary | ICD-10-CM | POA: Insufficient documentation

## 2016-10-26 DIAGNOSIS — Z8249 Family history of ischemic heart disease and other diseases of the circulatory system: Secondary | ICD-10-CM | POA: Insufficient documentation

## 2016-10-26 DIAGNOSIS — M170 Bilateral primary osteoarthritis of knee: Secondary | ICD-10-CM | POA: Diagnosis not present

## 2016-10-26 DIAGNOSIS — Z7952 Long term (current) use of systemic steroids: Secondary | ICD-10-CM | POA: Insufficient documentation

## 2016-10-26 DIAGNOSIS — Z85841 Personal history of malignant neoplasm of brain: Secondary | ICD-10-CM | POA: Insufficient documentation

## 2016-10-26 DIAGNOSIS — N132 Hydronephrosis with renal and ureteral calculous obstruction: Secondary | ICD-10-CM | POA: Diagnosis not present

## 2016-10-26 DIAGNOSIS — Z79899 Other long term (current) drug therapy: Secondary | ICD-10-CM | POA: Diagnosis not present

## 2016-10-26 DIAGNOSIS — J45909 Unspecified asthma, uncomplicated: Secondary | ICD-10-CM | POA: Insufficient documentation

## 2016-10-26 DIAGNOSIS — Z87442 Personal history of urinary calculi: Secondary | ICD-10-CM | POA: Diagnosis not present

## 2016-10-26 DIAGNOSIS — Z9889 Other specified postprocedural states: Secondary | ICD-10-CM | POA: Diagnosis not present

## 2016-10-26 DIAGNOSIS — J849 Interstitial pulmonary disease, unspecified: Secondary | ICD-10-CM | POA: Insufficient documentation

## 2016-10-26 HISTORY — PX: CYSTOSCOPY W/ URETERAL STENT PLACEMENT: SHX1429

## 2016-10-26 LAB — CBC
HCT: 39 % (ref 36.0–46.0)
Hemoglobin: 13.1 g/dL (ref 12.0–15.0)
MCH: 30.2 pg (ref 26.0–34.0)
MCHC: 33.6 g/dL (ref 30.0–36.0)
MCV: 89.9 fL (ref 78.0–100.0)
Platelets: 208 10*3/uL (ref 150–400)
RBC: 4.34 MIL/uL (ref 3.87–5.11)
RDW: 12.7 % (ref 11.5–15.5)
WBC: 7.4 10*3/uL (ref 4.0–10.5)

## 2016-10-26 LAB — COMPREHENSIVE METABOLIC PANEL
ALT: 21 U/L (ref 14–54)
AST: 18 U/L (ref 15–41)
Albumin: 4.5 g/dL (ref 3.5–5.0)
Alkaline Phosphatase: 54 U/L (ref 38–126)
Anion gap: 8 (ref 5–15)
BUN: 16 mg/dL (ref 6–20)
CO2: 23 mmol/L (ref 22–32)
Calcium: 9.4 mg/dL (ref 8.9–10.3)
Chloride: 107 mmol/L (ref 101–111)
Creatinine, Ser: 0.78 mg/dL (ref 0.44–1.00)
GFR calc Af Amer: >60 mL/min (ref >60)
GFR calc non Af Amer: >60 mL/min (ref >60)
Glucose, Bld: 105 mg/dL — ABNORMAL HIGH (ref 65–99)
Potassium: 3.5 mmol/L (ref 3.5–5.1)
Sodium: 138 mmol/L (ref 135–145)
Total Bilirubin: 0.8 mg/dL (ref 0.3–1.2)
Total Protein: 7.5 g/dL (ref 6.5–8.1)

## 2016-10-26 LAB — URINALYSIS, ROUTINE W REFLEX MICROSCOPIC
Bacteria, UA: NONE SEEN
Bilirubin Urine: NEGATIVE
Glucose, UA: NEGATIVE mg/dL
Ketones, ur: NEGATIVE mg/dL
Nitrite: NEGATIVE
Protein, ur: 30 mg/dL — AB
Specific Gravity, Urine: 1.016 (ref 1.005–1.030)
pH: 6 (ref 5.0–8.0)

## 2016-10-26 LAB — LIPASE, BLOOD: Lipase: 25 U/L (ref 11–51)

## 2016-10-26 SURGERY — CYSTOSCOPY, WITH RETROGRADE PYELOGRAM AND URETERAL STENT INSERTION
Anesthesia: General | Laterality: Right

## 2016-10-26 MED ORDER — FENTANYL CITRATE (PF) 100 MCG/2ML IJ SOLN: 25.0000 ug | INTRAMUSCULAR | Status: DC | PRN

## 2016-10-26 MED ORDER — HYDROMORPHONE HCL 2 MG/ML IJ SOLN
2.0000 mg | Freq: Once | INTRAMUSCULAR | Status: AC
  Administered 2016-10-26: 2 mg via INTRAVENOUS
  Filled 2016-10-26: qty 1

## 2016-10-26 MED ORDER — DIPHENHYDRAMINE HCL 50 MG/ML IJ SOLN
INTRAMUSCULAR | Status: AC
  Filled 2016-10-26: qty 1

## 2016-10-26 MED ORDER — HYDROMORPHONE HCL 1 MG/ML IJ SOLN
INTRAMUSCULAR | Status: DC | PRN
  Administered 2016-10-26: 0.5 mg via INTRAVENOUS
  Administered 2016-10-26 (×2): .25 mg via INTRAVENOUS

## 2016-10-26 MED ORDER — PROMETHAZINE HCL 25 MG/ML IJ SOLN
12.5000 mg | Freq: Once | INTRAMUSCULAR | Status: DC
  Filled 2016-10-26: qty 1

## 2016-10-26 MED ORDER — FENTANYL CITRATE (PF) 100 MCG/2ML IJ SOLN
INTRAMUSCULAR | Status: DC | PRN
  Administered 2016-10-26: 50 ug via INTRAVENOUS
  Administered 2016-10-26: 100 ug via INTRAVENOUS
  Administered 2016-10-26: 50 ug via INTRAVENOUS

## 2016-10-26 MED ORDER — IOHEXOL 300 MG/ML  SOLN
INTRAMUSCULAR | Status: DC | PRN
  Administered 2016-10-26: 19:00:00

## 2016-10-26 MED ORDER — PROPOFOL 10 MG/ML IV BOLUS
INTRAVENOUS | Status: DC | PRN
  Administered 2016-10-26: 200 mg via INTRAVENOUS

## 2016-10-26 MED ORDER — LIDOCAINE 2% (20 MG/ML) 5 ML SYRINGE
INTRAMUSCULAR | Status: DC | PRN
  Administered 2016-10-26: 20 mg via INTRAVENOUS

## 2016-10-26 MED ORDER — KETOROLAC TROMETHAMINE 15 MG/ML IJ SOLN
15.0000 mg | Freq: Once | INTRAMUSCULAR | Status: AC
  Administered 2016-10-26: 15 mg via INTRAVENOUS

## 2016-10-26 MED ORDER — SODIUM CHLORIDE 0.9 % IR SOLN
Status: DC | PRN
  Administered 2016-10-26: 3000 mL

## 2016-10-26 MED ORDER — SUCCINYLCHOLINE CHLORIDE 200 MG/10ML IV SOSY
PREFILLED_SYRINGE | INTRAVENOUS | Status: DC | PRN
  Administered 2016-10-26: 120 mg via INTRAVENOUS

## 2016-10-26 MED ORDER — HYDROMORPHONE HCL 2 MG/ML IJ SOLN
INTRAMUSCULAR | Status: AC
  Filled 2016-10-26: qty 1

## 2016-10-26 MED ORDER — MEPERIDINE HCL 50 MG/ML IJ SOLN: 6.2500 mg | INTRAMUSCULAR | Status: DC | PRN

## 2016-10-26 MED ORDER — ONDANSETRON HCL 4 MG/2ML IJ SOLN
INTRAMUSCULAR | Status: DC | PRN
  Administered 2016-10-26: 4 mg via INTRAVENOUS

## 2016-10-26 MED ORDER — FENTANYL CITRATE (PF) 100 MCG/2ML IJ SOLN
INTRAMUSCULAR | Status: AC
  Filled 2016-10-26: qty 2

## 2016-10-26 MED ORDER — CIPROFLOXACIN IN D5W 400 MG/200ML IV SOLN
INTRAVENOUS | Status: DC | PRN
  Administered 2016-10-26: 400 mg via INTRAVENOUS

## 2016-10-26 MED ORDER — BELLADONNA ALKALOIDS-OPIUM 16.2-60 MG RE SUPP
RECTAL | Status: AC
  Filled 2016-10-26: qty 1

## 2016-10-26 MED ORDER — PHENAZOPYRIDINE HCL 200 MG PO TABS: 200.0000 mg | ORAL_TABLET | Freq: Three times a day (TID) | ORAL | 0 refills | Status: DC | PRN

## 2016-10-26 MED ORDER — HYDROMORPHONE HCL 8 MG PO TABS: 4.0000 mg | ORAL_TABLET | ORAL | 0 refills | Status: DC | PRN

## 2016-10-26 MED ORDER — KETOROLAC TROMETHAMINE 15 MG/ML IJ SOLN
INTRAMUSCULAR | Status: AC
  Administered 2016-10-26: 15 mg via INTRAVENOUS
  Filled 2016-10-26: qty 1

## 2016-10-26 MED ORDER — HYDROMORPHONE HCL 1 MG/ML IJ SOLN
0.2500 mg | INTRAMUSCULAR | Status: DC | PRN
  Administered 2016-10-26 (×3): 0.5 mg via INTRAVENOUS

## 2016-10-26 MED ORDER — HYDROMORPHONE HCL 1 MG/ML IJ SOLN
INTRAMUSCULAR | Status: AC
  Filled 2016-10-26: qty 1

## 2016-10-26 MED ORDER — LIDOCAINE HCL 2 % EX GEL
CUTANEOUS | Status: AC
  Filled 2016-10-26: qty 5

## 2016-10-26 MED ORDER — PROMETHAZINE HCL 25 MG/ML IJ SOLN
INTRAMUSCULAR | Status: AC
  Administered 2016-10-26: 6.25 mg via INTRAVENOUS
  Filled 2016-10-26: qty 1

## 2016-10-26 MED ORDER — KETOROLAC TROMETHAMINE 15 MG/ML IJ SOLN
15.0000 mg | Freq: Once | INTRAMUSCULAR | Status: AC
  Administered 2016-10-26: 15 mg via INTRAVENOUS
  Filled 2016-10-26: qty 1

## 2016-10-26 MED ORDER — ONDANSETRON HCL 4 MG/2ML IJ SOLN
4.0000 mg | Freq: Once | INTRAMUSCULAR | Status: AC
  Administered 2016-10-26: 4 mg via INTRAVENOUS
  Filled 2016-10-26: qty 2

## 2016-10-26 MED ORDER — DIPHENHYDRAMINE HCL 50 MG/ML IJ SOLN
12.5000 mg | Freq: Once | INTRAMUSCULAR | Status: AC
  Administered 2016-10-26: 12.5 mg via INTRAVENOUS

## 2016-10-26 MED ORDER — BELLADONNA ALKALOIDS-OPIUM 16.2-60 MG RE SUPP
RECTAL | Status: DC | PRN
  Administered 2016-10-26: 1 via RECTAL

## 2016-10-26 MED ORDER — MIDAZOLAM HCL 2 MG/2ML IJ SOLN
INTRAMUSCULAR | Status: AC
  Filled 2016-10-26: qty 2

## 2016-10-26 MED ORDER — LACTATED RINGERS IV SOLN
INTRAVENOUS | Status: DC | PRN
  Administered 2016-10-26: 18:00:00 via INTRAVENOUS

## 2016-10-26 MED ORDER — PROPOFOL 10 MG/ML IV BOLUS
INTRAVENOUS | Status: AC
  Filled 2016-10-26: qty 20

## 2016-10-26 MED ORDER — LIDOCAINE HCL 2 % EX GEL
CUTANEOUS | Status: DC | PRN
  Administered 2016-10-26: 1 via URETHRAL

## 2016-10-26 MED ORDER — IOPAMIDOL (ISOVUE-300) INJECTION 61%
INTRAVENOUS | Status: AC
  Filled 2016-10-26: qty 100

## 2016-10-26 MED ORDER — IOPAMIDOL (ISOVUE-300) INJECTION 61%
100.0000 mL | Freq: Once | INTRAVENOUS | Status: AC | PRN
  Administered 2016-10-26: 100 mL via INTRAVENOUS

## 2016-10-26 MED ORDER — SODIUM CHLORIDE 0.9 % IV BOLUS (SEPSIS)
1000.0000 mL | Freq: Once | INTRAVENOUS | Status: AC
  Administered 2016-10-26: 1000 mL via INTRAVENOUS

## 2016-10-26 MED ORDER — TAPENTADOL HCL ER 50 MG PO TB12
150.0000 mg | ORAL_TABLET | Freq: Once | ORAL | Status: AC
  Administered 2016-10-26: 20:00:00 150 mg via ORAL
  Filled 2016-10-26: qty 1

## 2016-10-26 MED ORDER — PROMETHAZINE HCL 25 MG/ML IJ SOLN
6.2500 mg | INTRAMUSCULAR | Status: DC | PRN
  Administered 2016-10-26: 6.25 mg via INTRAVENOUS

## 2016-10-26 MED ORDER — CIPROFLOXACIN IN D5W 400 MG/200ML IV SOLN
INTRAVENOUS | Status: AC
  Filled 2016-10-26: qty 200

## 2016-10-26 SURGICAL SUPPLY — 18 items
BAG URO CATCHER STRL LF (MISCELLANEOUS) ×3 IMPLANT
BASKET DAKOTA 1.9FR 11X120 (BASKET) IMPLANT
BASKET ZERO TIP NITINOL 2.4FR (BASKET) IMPLANT
CATH URET 5FR 28IN OPEN ENDED (CATHETERS) ×3 IMPLANT
CLOTH BEACON ORANGE TIMEOUT ST (SAFETY) ×3 IMPLANT
GLOVE BIOGEL M STRL SZ7.5 (GLOVE) ×3 IMPLANT
GOWN STRL REUS W/TWL XL LVL3 (GOWN DISPOSABLE) ×3 IMPLANT
GUIDEWIRE ANG ZIPWIRE 038X150 (WIRE) IMPLANT
GUIDEWIRE STR DUAL SENSOR (WIRE) ×3 IMPLANT
MANIFOLD NEPTUNE II (INSTRUMENTS) ×3 IMPLANT
PACK CYSTO (CUSTOM PROCEDURE TRAY) ×3 IMPLANT
SHEATH ACCESS URETERAL 24CM (SHEATH) IMPLANT
SHEATH ACCESS URETERAL 38CM (SHEATH) IMPLANT
SHEATH ACCESS URETERAL 54CM (SHEATH) IMPLANT
STENT POLARIS 5FRX26 (STENTS) ×3 IMPLANT
TUBING CONNECTING 10 (TUBING) ×2 IMPLANT
TUBING CONNECTING 10' (TUBING) ×1
WIRE COONS/BENSON .038X145CM (WIRE) IMPLANT

## 2016-10-26 NOTE — ED Notes (Signed)
Pt has signed consent for surgical procedure.

## 2016-10-26 NOTE — ED Provider Notes (Signed)
Assumed care from Dr. Wilson Singer at 4 PM. Briefly, the patient is a 30 y here with R flank pain 2/2 1 cm obstructing stone. H/o instrumentation. Awaiting nephro consult.   Labs Reviewed  COMPREHENSIVE METABOLIC PANEL - Abnormal; Notable for the following:       Result Value   Glucose, Bld 105 (*)    All other components within normal limits  URINALYSIS, ROUTINE W REFLEX MICROSCOPIC - Abnormal; Notable for the following:    APPearance HAZY (*)    Hgb urine dipstick LARGE (*)    Protein, ur 30 (*)    Leukocytes, UA TRACE (*)    Squamous Epithelial / LPF 0-5 (*)    All other components within normal limits  LIPASE, BLOOD  CBC    Course of Care: -D/w Nephro who will see pt and admit/take to OR.  Clinical Impression: 1. Ureteral stone with hydronephrosis     Disposition: OR      Duffy Bruce, MD 10/26/16 1659

## 2016-10-26 NOTE — Anesthesia Procedure Notes (Signed)
Procedure Name: Intubation Date/Time: 10/26/2016 6:35 PM Performed by: Cynda Familia Pre-anesthesia Checklist: Patient identified, Emergency Drugs available, Suction available and Patient being monitored Patient Re-evaluated:Patient Re-evaluated prior to inductionOxygen Delivery Method: Circle System Utilized Preoxygenation: Pre-oxygenation with 100% oxygen Intubation Type: IV induction Ventilation: Mask ventilation without difficulty Laryngoscope Size: Miller and 2 Grade View: Grade I Tube type: Oral Number of attempts: 1 Airway Equipment and Method: Stylet Placement Confirmation: ETT inserted through vocal cords under direct vision,  positive ETCO2 and breath sounds checked- equal and bilateral Secured at: 22 cm Tube secured with: Tape Dental Injury: Teeth and Oropharynx as per pre-operative assessment  Comments: Smooth IV induction by Glennon Mac--- intubation AM CRNA atraumatic--- teeth and mouth as preop-- bilat BS Glennon Mac

## 2016-10-26 NOTE — ED Notes (Signed)
OR to come get patient

## 2016-10-26 NOTE — H&P (Signed)
I have been asked to see the patient by Dr. Virgel Manifold, for evaluation and management of right obstructing ureteral stone.  History of present illness: 41 year old female with a history of nephrolithiasis who presented to the emergency department today with increasing right-sided abdominal pain and associated nausea. The pain began yesterday. She did have some emesis earlier today. She denies any associated right flank pain or suprapubic pain she's not had any voiding symptoms. She denies any dysuria. She denies any fevers/chills.  In the emergency department the patient was found to have a normal white blood cell count with normal renal function. Her urinalysis did demonstrate microscopic hematuria, but no clear evidence of infection. She subsequently underwent a CT scan which demonstrated a 1 cm right proximal ureteral stone with associated hydronephrosis.  The patient's past medical history is significant for brain tumor in she subsequently had 4 brain surgeries. I recently saw her in clinic for problems with her shunt contributing to some bladder symptoms. She is seeing Dr. Janice Norrie in the past for nephrolithiasis.  Review of systems: A 12 point comprehensive review of systems was obtained and is negative unless otherwise stated in the history of present illness.  Patient Active Problem List   Diagnosis Date Noted  . Asthma with acute exacerbation 01/23/2016  . Acute sinusitis 01/23/2016  . Allergic rhinitis 01/23/2016  . ILD (interstitial lung disease) ([place]) 07/30/2012  . Dyspnea 07/16/2012    Current Facility-Administered Medications on File Prior to Encounter  Medication Dose Route Frequency Provider Last Rate Last Dose  . predniSONE (DELTASONE) tablet 10 mg  10 mg Oral UD Adelina Mings, MD       Current Outpatient Prescriptions on File Prior to Encounter  Medication Sig Dispense Refill  . budesonide (PULMICORT FLEXHALER) 180 MCG/ACT inhaler Inhale 2 puffs into the lungs 2  (two) times daily. 3 each 3  . budesonide (RHINOCORT AQUA) 32 MCG/ACT nasal spray Place 1 spray into both nostrils daily. 3 Bottle 1  . cetirizine (ZYRTEC) 10 MG tablet TAKE ONE TABLET ONCE DAILY FOR RUNNY NOSE OR ITCHING. (Patient taking differently: TAKE 10 MG BY MOUTH EVERY EVENING FOR RUNNY NOSE OR ITCHING) 90 tablet 1  . Eszopiclone 3 MG TABS Take 3 mg by mouth at bedtime. Reported on 01/23/2016    . fish oil-omega-3 fatty acids 1000 MG capsule Take 1 g by mouth at bedtime. Reported on 01/23/2016    . HYDROmorphone (DILAUDID) 8 MG tablet Take 4 mg by mouth every 4 (four) hours as needed for severe pain (Kidney Stones). Reported on 01/23/2016    . omeprazole (PRILOSEC OTC) 20 MG tablet Take 20 mg by mouth at bedtime.    . ondansetron (ZOFRAN) 4 MG tablet Take 4 mg by mouth every 8 (eight) hours as needed for nausea or vomiting. Reported on 01/23/2016    . XOPENEX HFA 45 MCG/ACT inhaler USE 1 TO 2 INHALATIONS EVERY 4 HOURS AS NEEDED FOR WHEEZING (Patient not taking: Reported on 10/26/2016) 45 g 1    Past Medical History:  Diagnosis Date  . Anxiety   . Arthritis    arthritis in knees  . Asthma    asthma since a child. Well controlled  . Brain tumor ([place])   . 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 Kidney stone   . Migraine   . Pneumonia    last time 2 yr ago  Past Surgical History:  Procedure Laterality Date  . BRAIN SURGERY  2015   Brain surgery x 4 in 2015 at [facility]    . CHOLECYSTECTOMY    . CYSTOSCOPY WITH RETROGRADE PYELOGRAM, URETEROSCOPY AND STENT PLACEMENT Right 10/18/2014   Procedure: CYSTOSCOPY WITH RETROGRADE PYELOGRAM, URETEROSCOPY, STONE EXTRACTION AND STENT PLACEMENT;  Surgeon: Arvil Persons, MD;  Location: [facility];  Service: Urology;  Laterality: Right;  . CYSTOSTOMY W/ STENT INSERTION     numerous times  . HOLMIUM LASER APPLICATION Right [id number]    Procedure: HOLMIUM LASER APPLICATION;  Surgeon: Arvil Persons, MD;  Location: [facility];  Service: Urology;  Laterality: Right;  . LASER ABLATION OF THE CERVIX      Social History  Substance Use Topics  . Smoking status: Never Smoker  . Smokeless tobacco: Never Used  . Alcohol use No    Family History  Problem Relation Age of Onset  . Allergies Father   . Heart disease Mother   . Clotting disorder Mother   . Heart disease Maternal Grandmother   . Heart disease Paternal Grandmother   . Cancer      grandparents  . Urticaria Neg Hx   . Immunodeficiency Neg Hx   . Eczema Neg Hx   . Atopy Neg Hx   . Asthma Neg Hx   . Angioedema Neg Hx   . Allergic rhinitis Neg Hx     PE: Vitals:   10/26/16 1530 10/26/16 1600 10/26/16 1637 10/26/16 1700  BP:  130/80 126/65 107/58  Pulse: 89 86 89 90  Resp:   19   Temp:      TempSrc:      SpO2: 99% 99% 100% 100%  Weight:      Height:       Patient appears to be in no acute distress  patient is alert and oriented x3 Atraumatic normocephalic head No cervical or supraclavicular lymphadenopathy appreciated No increased work of breathing, no audible wheezes/rhonchi Regular sinus rhythm/rate Abdomen is soft, nontender, nondistended, no CVA or suprapubic tenderness Lower extremities are symmetric without appreciable edema Grossly neurologically intact No identifiable skin lesions   Recent Labs  10/26/16 1217  WBC 7.4  HGB 13.1  HCT 39.0    Recent Labs  10/26/16 1217  NA 138  K 3.5  CL 107  CO2 23  GLUCOSE 105*  BUN 16  CREATININE 0.78  CALCIUM 9.4   No results for input(s): LABPT, INR in the last 72 hours. No results for input(s): LABURIN in the last 72 hours. Results for orders placed or performed during the hospital encounter of 12/01/13  Urine culture     Status: None   Collection Time: 12/01/13  4:04 PM  Result Value Ref Range Status   Specimen Description URINE, CATHETERIZED  Final   Special Requests NONE  Final    Culture  Setup Time   Final    12/01/2013 21:42 Performed at [facility] Performed at [facility]  Final   Culture NO GROWTH Performed at [facility]  Final   Report Status 12/02/2013 FINAL  Final    Imaging: I have independently reviewed the patient's CT scan which demonstrates a 1 cm stone in the right proximal ureter with hydronephrosis.  Imp: The patient has a large right-sided stone that is obstructing and causing associated nausea vomiting and pain. In the ER, the patient's pain was very difficult to control,  likely at associated with her tolerance for pain medication. However, she does have a large stone that she is unlikely to pass in the near future. There is no evidence of infection.  Recommendations: I went over the treatment options with the patient including conservative management or stent placement. I advocated for stent placement given that she isn't a significant amount of pain in the size of the stone would require prolonged ureteral stone extraction. Pre-stenting prior to the procedure would make things significantly easier and likely a better outcome. As such, the patient would like to proceed with placement of a right ureteral stent with follow-up for second stage ureteroscopy in the coming weeks.   The patient will be discharged home following the procedure.  Louis Meckel W

## 2016-10-26 NOTE — Op Note (Signed)
Preoperative diagnosis: right obstructing ureteral stone Postoperative diagnosis: Same  Procedures performed: Cystoscopy, right retrograde pyelogram with interpretation, right ureteral stent placement  Surgeon: Dr. [name]  Findings: A retrograde pyelogram was performed, contrast was instilled into the right collecting system and there was a small filling defect in the ureter at the expected location of the stone and proximal hydronephrosis. There were no other abnormalities.  Specimens:right renal pelvis urine culture  Procedure in detail: Patient was consented prior to being brought back to the operating room. He was then brought back to the operating room placed on the table in supine position. General anesthesia was then induced and endotracheal tube inserted. This then placed in dorsolithotomy position and prepped and draped in the routine sterile fashion. A timeout was held.  Using a 22.5 [place] cystoscope with a 30  lens, I gently passed the scope into the patient's urethra and into the bladder under visual guidance. A 360 cystoscopic evaluation was performed with no mucosal abnormalities, no tumors, and no foreign bodies identified. I then passed a 0. 038 Sensor wire into the right ureteral orifice and into the right renal pelvis. I then slid a 5 [place] open-ended ureteral catheter over the wire and into the renal pelvis and removed the wire. Marland Kitchen Next I injected 10 cc of contrast into the renal collecting system and performed a retrograde pyelogram. I then replaced the wire through the open-ended ureteral catheter and remove the catheter. I then slid a 26 cm x5 [place] polaris ureteral stent over the wire and into the renal pelvis under fluoroscopic guidance. Once a nice curl was noted in the renal pelvis I advanced the distal end of the stent into the bladder before removing the wire completely. A final fluoroscopic image was obtained confirming the curl in the renal pelvis as well  as a curl in the bladder.   Disposition: The patient returned to the PACU in stable condition.

## 2016-10-26 NOTE — ED Notes (Addendum)
Patient back from CT.  Advised that vomited while in CT and is nauseas.  Would like to see if she could have something for nausea.  Will notify MD.

## 2016-10-26 NOTE — ED Provider Notes (Signed)
[facility] DEPT Provider Note   CSN: [id number] Arrival date & time: 10/26/16  1123  By signing my name below, I, Sonum Patel, attest that this documentation has been prepared under the direction and in the presence of Virgel Manifold, MD. Electronically Signed: Sonum Patel, Education administrator. 10/26/16. 12:48 PM.  History   Chief Complaint Chief Complaint  Patient presents with  . Abdominal Pain    The history is provided by the patient. No language interpreter was used.    HPI Comments: [name] is a 41 y.o. female who presents to the Emergency Department complaining of sharp and dull RLQ abdominal pain that began yesterday afternoon at rest. She has associated nausea with 1 episode of vomiting this morning. She also reports increased BMs; states typically she has them every other day. She states the pain is alleviated by pressing or lying on the affected side and worsens with movement. She reports taking Nucynta and Dilaudid last night, which she is prescribed by pain management for history of 4 brain surgeries, and this alleviated her pain enough to sleep. She states the pain returned about 1 hour after waking today and has been constant since then. She has a history of kidney stones but states her current symptoms feel completely different. She has a history of a cholecystectomy. She denies urinary symptoms, vaginal bleeding, vaginal discharge, diarrhea, blood in stool.   Past Medical History:  Diagnosis Date  . Anxiety   . Arthritis    arthritis in knees  . Asthma    asthma since a child. Well controlled  . Brain tumor ([place])   . 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 Kidney stone   . Migraine   . Pneumonia    last time 2 yr ago    Patient Active Problem List   Diagnosis Date Noted  . Asthma with acute exacerbation 01/23/2016  . Acute sinusitis 01/23/2016  . Allergic rhinitis 01/23/2016   . ILD (interstitial lung disease) ([place]) 07/30/2012  . Dyspnea 07/16/2012    Past Surgical History:  Procedure Laterality Date  . BRAIN SURGERY  2015   Brain surgery x 4 in 2015 at [facility]    . CHOLECYSTECTOMY    . CYSTOSCOPY WITH RETROGRADE PYELOGRAM, URETEROSCOPY AND STENT PLACEMENT Right 10/18/2014   Procedure: CYSTOSCOPY WITH RETROGRADE PYELOGRAM, URETEROSCOPY, STONE EXTRACTION AND STENT PLACEMENT;  Surgeon: Arvil Persons, MD;  Location[facility];  Service: Urology;  Laterality: Right;  . CYSTOSTOMY W/ STENT INSERTION     numerous times  . HOLMIUM LASER APPLICATION Right [id number]   Procedure: HOLMIUM LASER APPLICATION;  Surgeon: Arvil Persons, MD;  Location[facility];  Service: Urology;  Laterality: Right;  . LASER ABLATION OF THE CERVIX      OB History    No data available       Home Medications    Prior to Admission medications   Medication Sig Start Date End Date Taking? Authorizing Provider  budesonide (PULMICORT FLEXHALER) 180 MCG/ACT inhaler Inhale 2 puffs into the lungs 2 (two) times daily. 06/30/16  Yes Valentina Shaggy, MD  budesonide (RHINOCORT AQUA) 32 MCG/ACT nasal spray Place 1 spray into both nostrils daily. 07/30/15  Yes Roselyn Malachy Moan, MD  cetirizine (ZYRTEC) 10 MG tablet TAKE ONE TABLET ONCE DAILY FOR RUNNY NOSE OR ITCHING. Patient taking differently: TAKE 10 MG BY MOUTH  EVERY EVENING FOR RUNNY NOSE OR ITCHING 05/06/16  Yes Adelina Mings, MD  chlorzoxazone (PARAFON) 500 MG tablet Take 500 mg by mouth 2 (two) times daily as needed for muscle spasms.  09/30/16  Yes Historical Provider, MD  Eszopiclone 3 MG TABS Take 3 mg by mouth at bedtime. Reported on 01/23/2016   Yes Historical Provider, MD  fish oil-omega-3 fatty acids 1000 MG capsule Take 1 g by mouth at bedtime. Reported on 01/23/2016   Yes Historical Provider, MD  HYDROmorphone (DILAUDID) 8 MG tablet Take 4 mg by mouth every 4 (four) hours as needed for severe pain (Kidney  Stones). Reported on 01/23/2016   Yes Historical Provider, MD  NUCYNTA ER 150 MG TB12 Take 150 mg by mouth every 12 (twelve) hours. 09/30/16  Yes Historical Provider, MD  omeprazole (PRILOSEC OTC) 20 MG tablet Take 20 mg by mouth at bedtime.   Yes Historical Provider, MD  ondansetron (ZOFRAN) 4 MG tablet Take 4 mg by mouth every 8 (eight) hours as needed for nausea or vomiting. Reported on 01/23/2016   Yes Historical Provider, MD  temazepam (RESTORIL) 15 MG capsule Take 15 mg by mouth at bedtime as needed for sleep.  07/29/16  Yes Historical Provider, MD  XOPENEX HFA 45 MCG/ACT inhaler USE 1 TO 2 INHALATIONS EVERY 4 HOURS AS NEEDED FOR WHEEZING Patient not taking: Reported on 10/26/2016 04/01/16   Jiles Prows, MD    Family History Family History  Problem Relation Age of Onset  . Allergies Father   . Heart disease Mother   . Clotting disorder Mother   . Heart disease Maternal Grandmother   . Heart disease Paternal Grandmother   . Cancer      grandparents  . Urticaria Neg Hx   . Immunodeficiency Neg Hx   . Eczema Neg Hx   . Atopy Neg Hx   . Asthma Neg Hx   . Angioedema Neg Hx   . Allergic rhinitis Neg Hx     Social History Social History  Substance Use Topics  . Smoking status: Never Smoker  . Smokeless tobacco: Never Used  . Alcohol use No     Allergies   Penicillins; Albuterol; Codeine; Compazine [prochlorperazine edisylate]; and Midazolam   Review of Systems Review of Systems  A complete 10 system review of systems was obtained and all systems are negative except as noted in the HPI and PMH.    Physical Exam Updated Vital Signs BP 127/72 (BP Location: Left Arm)   Pulse (!) 128   Temp 97.5 F (36.4 C) (Oral)   Resp 18   Ht 5\' 9"  (1.753 m)   Wt 205 lb (93 kg)   SpO2 95%   BMI 30.27 kg/m   Physical Exam  Constitutional: She is oriented to person, place, and time. She appears well-developed and well-nourished. No distress.  HENT:  Head: Normocephalic and  atraumatic.  Eyes: EOM are normal.  Neck: Normal range of motion.  Cardiovascular: Normal rate, regular rhythm and normal heart sounds.   Pulmonary/Chest: Effort normal and breath sounds normal.  Abdominal: Soft. She exhibits no distension. There is tenderness.  Tenderness to RLQ   Genitourinary:  Genitourinary Comments: No CVA tenderness  Musculoskeletal: Normal range of motion.  Neurological: She is alert and oriented to person, place, and time.  Skin: Skin is warm and dry.  Psychiatric: She has a normal mood and affect. Judgment normal.  Nursing note and vitals reviewed.    ED Treatments / Results  DIAGNOSTIC STUDIES: Oxygen Saturation is 95% on RA, adequate by my interpretation.    COORDINATION OF CARE: 12:21 PM Discussed treatment plan with pt at bedside and pt agreed to plan.   Labs (all labs ordered are listed, but only abnormal results are displayed) Labs Reviewed  COMPREHENSIVE METABOLIC PANEL - Abnormal; Notable for the following:       Result Value   Glucose, Bld 105 (*)    All other components within normal limits  URINALYSIS, ROUTINE W REFLEX MICROSCOPIC - Abnormal; Notable for the following:    APPearance HAZY (*)    Hgb urine dipstick LARGE (*)    Protein, ur 30 (*)    Leukocytes, UA TRACE (*)    Squamous Epithelial / LPF 0-5 (*)    All other components within normal limits  LIPASE, BLOOD  CBC    EKG  EKG Interpretation None       Radiology Ct Abdomen Pelvis W Contrast  Result Date: 10/26/2016 CLINICAL DATA:  Acute right lower quadrant abdominal pain. EXAM: CT ABDOMEN AND PELVIS WITH CONTRAST TECHNIQUE: Multidetector CT imaging of the abdomen and pelvis was performed using the standard protocol following bolus administration of intravenous contrast. CONTRAST:  173mL ISOVUE-300 IOPAMIDOL (ISOVUE-300) INJECTION 61% COMPARISON:  CT scan of October 16, 2014. FINDINGS: Lower chest: No acute abnormality. Hepatobiliary: No focal liver abnormality is seen.  Status post cholecystectomy. No biliary dilatation. Pancreas: Unremarkable. No pancreatic ductal dilatation or surrounding inflammatory changes. Spleen: Normal in size without focal abnormality. Adrenals/Urinary Tract: Adrenal glands appear normal. Bilateral nephrolithiasis is noted. Moderate right hydronephrosis is noted secondary to 1 cm calculus at ureteral pelvic junction. Urinary bladder is unremarkable. Stomach/Bowel: Stomach is within normal limits. Appendix appears normal. No evidence of bowel wall thickening, distention, or inflammatory changes. Vascular/Lymphatic: No significant vascular findings are present. No enlarged abdominal or pelvic lymph nodes. Reproductive: Uterus and bilateral adnexa are unremarkable. Other: No abdominal wall hernia or abnormality. No abdominopelvic ascites. Distal tip of ventriculoperitoneal shunt is seen in right lower quadrant. Musculoskeletal: No acute or significant osseous findings. IMPRESSION: Bilateral nephrolithiasis. Moderate right hydronephrosis secondary to a 1 cm calculus at right ureteropelvic junction. Electronically Signed   By: Marijo Conception, M.D.   On: 10/26/2016 14:46    Procedures Procedures (including critical care time)  Medications Ordered in ED Medications - No data to display   Initial Impression / Assessment and Plan / ED Course  I have reviewed the triage vital signs and the nursing notes.  Pertinent labs & imaging results that were available during my care of the patient were reviewed by me and considered in my medical decision making (see chart for details).     41 year old female with right lower quadrant pain. CT is significant for a 1 cm stone at the right UPJ. There is moderate hydronephrosis. She was given several doses of pain medication including opiates and toradol. She continues remains significantly symptomatic. Will discuss with urology.  Final Clinical Impressions(s) / ED Diagnoses   Final diagnoses:  Ureteral  stone with hydronephrosis    New Prescriptions New Prescriptions   No medications on file   I personally preformed the services scribed in my presence. The recorded information has been reviewed is accurate. Virgel Manifold, MD.    Virgel Manifold, MD 10/29/16 1044

## 2016-10-26 NOTE — Anesthesia Preprocedure Evaluation (Addendum)
Anesthesia Evaluation  Patient identified by MRN, date of birth, ID band Patient awake    Reviewed: Allergy & Precautions, NPO status , Patient's Chart, lab work & pertinent test results  History of Anesthesia Complications Negative for: history of anesthetic complications  Airway Mallampati: II  TM Distance: >3 FB Neck ROM: Full    Dental  (+) Dental Advisory Given   Pulmonary asthma ,    breath sounds clear to auscultation       Cardiovascular negative cardio ROS   Rhythm:Regular Rate:Normal     Neuro/Psych  Headaches, Anxiety H/o brainstem astrocytoma: chemo, XRT      GI/Hepatic Neg liver ROS, GERD  Medicated and Controlled,  Endo/Other  Morbid obesity  Renal/GU negative Renal ROS     Musculoskeletal   Abdominal (+) + obese,   Peds  Hematology negative hematology ROS (+)   Anesthesia Other Findings   Reproductive/Obstetrics S/p ablation                          Anesthesia Physical Anesthesia Plan  ASA: III  Anesthesia Plan: General   Post-op Pain Management:    Induction: Intravenous and Rapid sequence  Airway Management Planned: Oral ETT  Additional Equipment:   Intra-op Plan:   Post-operative Plan: Extubation in OR  Informed Consent: I have reviewed the patients History and Physical, chart, labs and discussed the procedure including the risks, benefits and alternatives for the proposed anesthesia with the patient or authorized representative who has indicated his/her understanding and acceptance.   Dental advisory given  Plan Discussed with: CRNA and Surgeon  Anesthesia Plan Comments: (Plan routine monitors, GETA)        Anesthesia Quick Evaluation

## 2016-10-26 NOTE — Addendum Note (Signed)
Addendum  created 10/26/16 1957 by Cynda Familia, CRNA   Anesthesia Intra Meds edited

## 2016-10-26 NOTE — ED Triage Notes (Signed)
Patient reports constant sharp RLQ pain with N/V/D since last night. Denies chest pain and SOB. Ambulatory to triage.

## 2016-10-26 NOTE — ED Notes (Signed)
EDP informed of pt being nauseated and in pain.

## 2016-10-26 NOTE — Anesthesia Postprocedure Evaluation (Signed)
Anesthesia Post Note  Patient: [name]  Procedure(s) Performed: Procedure(s) (LRB): CYSTOSCOPY WITH RETROGRADE PYELOGRAM/URETERAL STENT PLACEMENT (Right)  Patient location during evaluation: PACU Anesthesia Type: General Level of consciousness: awake and alert, oriented and patient cooperative Pain management: pain level controlled Vital Signs Assessment: post-procedure vital signs reviewed and stable Respiratory status: spontaneous breathing, nonlabored ventilation and respiratory function stable Cardiovascular status: blood pressure returned to baseline and stable Postop Assessment: no signs of nausea or vomiting Anesthetic complications: no       Last Vitals:  Vitals:   10/26/16 1915 10/26/16 1930  BP: 131/90   Pulse: 92   Resp: (!) 28   Temp:  37.2 C    Last Pain:  Vitals:   10/26/16 1738  TempSrc:   PainSc: 6                  Kathia Covington,E. Zong Mcquarrie

## 2016-10-26 NOTE — Transfer of Care (Signed)
Immediate Anesthesia Transfer of Care Note  Patient: [name]  Procedure(s) Performed: Procedure(s): CYSTOSCOPY WITH RETROGRADE PYELOGRAM/URETERAL STENT PLACEMENT (Right)  Patient Location: PACU  Anesthesia Type:General  Level of Consciousness: awake and alert   Airway & Oxygen Therapy: Patient Spontanous Breathing and Patient connected to face mask oxygen  Post-op Assessment: Report given to RN and Post -op Vital signs reviewed and stable  Post vital signs: Reviewed and stable  Last Vitals:  Vitals:   10/26/16 1727 10/26/16 1906  BP: 107/58 128/71  Pulse: 99 (!) 101  Resp: 18 (!) 38  Temp:  37.1 C    Last Pain:  Vitals:   10/26/16 1738  TempSrc:   PainSc: 6          Complications: No apparent anesthesia complications

## 2016-10-26 NOTE — Discharge Instructions (Signed)
DISCHARGE INSTRUCTIONS FOR KIDNEY STONE/URETERAL STENT   MEDICATIONS:  1.  Resume all your other meds from home - except do not take any extra narcotic pain meds that you may have at home.  2. Pyridium is to help with the burning/stinging when you urinate. 3. Dilaudid is for pain  ACTIVITY:  1. No strenuous activity x 1week  2. No driving while on narcotic pain medications  3. Drink plenty of water  4. Continue to walk at home - you can still get blood clots when you are at home, so keep active, but don't over do it.  5. May return to work/school tomorrow or when you feel ready   BATHING:  1. You can shower and we recommend daily showers   SIGNS/SYMPTOMS TO CALL:  Please call us if you have a fever greater than 101.5, uncontrolled nausea/vomiting, uncontrolled pain, dizziness, unable to urinate, bloody urine, chest pain, shortness of breath, leg swelling, leg pain, redness around wound, drainage from wound, or any other concerns or questions.   You can reach [place] at [phone].   FOLLOW-UP:  1. You will be scheduled for repeat ureteroscopy in 1-2 weeks for removal of the stone with in the right kidney.

## 2016-10-27 ENCOUNTER — Other Ambulatory Visit: Payer: Self-pay | Admitting: Urology

## 2016-10-27 ENCOUNTER — Encounter (HOSPITAL_COMMUNITY): Payer: Self-pay | Admitting: Urology

## 2016-10-28 ENCOUNTER — Encounter (HOSPITAL_COMMUNITY): Payer: Self-pay | Admitting: *Deleted

## 2016-10-29 ENCOUNTER — Encounter (HOSPITAL_COMMUNITY)
Admission: RE | Disposition: A | Discharge status: Home / Self Care | Payer: Self-pay | Source: Ambulatory Visit | Attending: Urology

## 2016-10-29 ENCOUNTER — Ambulatory Visit (HOSPITAL_COMMUNITY): Payer: BLUE CROSS/BLUE SHIELD | Admitting: Anesthesiology

## 2016-10-29 ENCOUNTER — Ambulatory Visit (HOSPITAL_COMMUNITY)
Admission: RE | Admit: 2016-10-29 | Discharge: 2016-10-29 | Disposition: A | Discharge status: Home / Self Care | Payer: BLUE CROSS/BLUE SHIELD | Source: Ambulatory Visit | Attending: Urology | Admitting: Urology

## 2016-10-29 ENCOUNTER — Encounter (HOSPITAL_COMMUNITY): Payer: Self-pay | Admitting: *Deleted

## 2016-10-29 DIAGNOSIS — Z79899 Other long term (current) drug therapy: Secondary | ICD-10-CM | POA: Insufficient documentation

## 2016-10-29 DIAGNOSIS — Z8489 Family history of other specified conditions: Secondary | ICD-10-CM | POA: Insufficient documentation

## 2016-10-29 DIAGNOSIS — Z923 Personal history of irradiation: Secondary | ICD-10-CM | POA: Insufficient documentation

## 2016-10-29 DIAGNOSIS — N132 Hydronephrosis with renal and ureteral calculous obstruction: Secondary | ICD-10-CM | POA: Diagnosis not present

## 2016-10-29 DIAGNOSIS — Z7951 Long term (current) use of inhaled steroids: Secondary | ICD-10-CM | POA: Diagnosis not present

## 2016-10-29 DIAGNOSIS — Z832 Family history of diseases of the blood and blood-forming organs and certain disorders involving the immune mechanism: Secondary | ICD-10-CM | POA: Diagnosis not present

## 2016-10-29 DIAGNOSIS — M170 Bilateral primary osteoarthritis of knee: Secondary | ICD-10-CM | POA: Diagnosis not present

## 2016-10-29 DIAGNOSIS — N201 Calculus of ureter: Secondary | ICD-10-CM | POA: Diagnosis present

## 2016-10-29 DIAGNOSIS — Z87442 Personal history of urinary calculi: Secondary | ICD-10-CM | POA: Insufficient documentation

## 2016-10-29 DIAGNOSIS — Z9221 Personal history of antineoplastic chemotherapy: Secondary | ICD-10-CM | POA: Diagnosis not present

## 2016-10-29 DIAGNOSIS — Z85841 Personal history of malignant neoplasm of brain: Secondary | ICD-10-CM | POA: Diagnosis not present

## 2016-10-29 DIAGNOSIS — J45909 Unspecified asthma, uncomplicated: Secondary | ICD-10-CM | POA: Insufficient documentation

## 2016-10-29 DIAGNOSIS — J849 Interstitial pulmonary disease, unspecified: Secondary | ICD-10-CM | POA: Diagnosis not present

## 2016-10-29 DIAGNOSIS — Z9049 Acquired absence of other specified parts of digestive tract: Secondary | ICD-10-CM | POA: Insufficient documentation

## 2016-10-29 DIAGNOSIS — R3129 Other microscopic hematuria: Secondary | ICD-10-CM | POA: Insufficient documentation

## 2016-10-29 DIAGNOSIS — Z9889 Other specified postprocedural states: Secondary | ICD-10-CM | POA: Insufficient documentation

## 2016-10-29 DIAGNOSIS — Z8249 Family history of ischemic heart disease and other diseases of the circulatory system: Secondary | ICD-10-CM | POA: Insufficient documentation

## 2016-10-29 HISTORY — PX: CYSTOSCOPY WITH RETROGRADE PYELOGRAM, URETEROSCOPY AND STENT PLACEMENT: SHX5789

## 2016-10-29 HISTORY — PX: HOLMIUM LASER APPLICATION: SHX5852

## 2016-10-29 LAB — PREGNANCY, URINE: Preg Test, Ur: NEGATIVE

## 2016-10-29 SURGERY — CYSTOURETEROSCOPY, WITH RETROGRADE PYELOGRAM AND STENT INSERTION
Anesthesia: General | Site: Ureter | Laterality: Right

## 2016-10-29 MED ORDER — SODIUM CHLORIDE 0.9 % IR SOLN
Status: DC | PRN
  Administered 2016-10-29: 1000 mL

## 2016-10-29 MED ORDER — DIAZEPAM 10 MG PO TABS: 10.0000 mg | ORAL_TABLET | Freq: Three times a day (TID) | ORAL | 0 refills | Status: DC | PRN

## 2016-10-29 MED ORDER — ONDANSETRON HCL 4 MG/2ML IJ SOLN
INTRAMUSCULAR | Status: AC
  Filled 2016-10-29: qty 2

## 2016-10-29 MED ORDER — LORAZEPAM 2 MG/ML IJ SOLN
1.0000 mg | Freq: Once | INTRAMUSCULAR | Status: AC
  Administered 2016-10-29: 1 mg via INTRAVENOUS
  Filled 2016-10-29: qty 0.5

## 2016-10-29 MED ORDER — FENTANYL CITRATE (PF) 100 MCG/2ML IJ SOLN
INTRAMUSCULAR | Status: DC | PRN
  Administered 2016-10-29 (×4): 50 ug via INTRAVENOUS

## 2016-10-29 MED ORDER — FENTANYL CITRATE (PF) 100 MCG/2ML IJ SOLN
INTRAMUSCULAR | Status: AC
  Filled 2016-10-29: qty 2

## 2016-10-29 MED ORDER — PROPOFOL 10 MG/ML IV BOLUS
INTRAVENOUS | Status: AC
  Filled 2016-10-29: qty 20

## 2016-10-29 MED ORDER — KETOROLAC TROMETHAMINE 30 MG/ML IJ SOLN: 30.0000 mg | Freq: Once | INTRAMUSCULAR | Status: DC | PRN

## 2016-10-29 MED ORDER — LIDOCAINE HCL (CARDIAC) 20 MG/ML IV SOLN
INTRAVENOUS | Status: DC | PRN
  Administered 2016-10-29: 80 mg via INTRAVENOUS

## 2016-10-29 MED ORDER — FENTANYL CITRATE (PF) 100 MCG/2ML IJ SOLN
25.0000 ug | INTRAMUSCULAR | Status: DC | PRN
  Administered 2016-10-29 (×2): 50 ug via INTRAVENOUS

## 2016-10-29 MED ORDER — HYDROMORPHONE HCL 1 MG/ML IJ SOLN
0.2500 mg | INTRAMUSCULAR | Status: DC | PRN
  Administered 2016-10-29 (×4): 0.5 mg via INTRAVENOUS

## 2016-10-29 MED ORDER — HYDROMORPHONE HCL 1 MG/ML IJ SOLN
INTRAMUSCULAR | Status: DC
Start: 2016-10-29 — End: 2016-10-29
  Filled 2016-10-29: qty 1

## 2016-10-29 MED ORDER — ONDANSETRON HCL 4 MG/2ML IJ SOLN
INTRAMUSCULAR | Status: DC | PRN
  Administered 2016-10-29: 4 mg via INTRAVENOUS

## 2016-10-29 MED ORDER — OXYCODONE HCL 5 MG PO TABS
10.0000 mg | ORAL_TABLET | Freq: Once | ORAL | Status: AC
  Administered 2016-10-29: 10 mg via ORAL

## 2016-10-29 MED ORDER — IOHEXOL 300 MG/ML  SOLN
INTRAMUSCULAR | Status: DC | PRN
  Administered 2016-10-29: 5 mL

## 2016-10-29 MED ORDER — OXYCODONE HCL 5 MG PO TABS
ORAL_TABLET | ORAL | Status: AC
  Filled 2016-10-29: qty 2

## 2016-10-29 MED ORDER — BELLADONNA ALKALOIDS-OPIUM 16.2-60 MG RE SUPP
RECTAL | Status: AC
  Filled 2016-10-29: qty 1

## 2016-10-29 MED ORDER — CIPROFLOXACIN IN D5W 400 MG/200ML IV SOLN
INTRAVENOUS | Status: AC
Start: 2016-10-29 — End: 2016-10-29
  Filled 2016-10-29: qty 200

## 2016-10-29 MED ORDER — LIDOCAINE HCL 2 % EX GEL
CUTANEOUS | Status: AC
  Filled 2016-10-29: qty 5

## 2016-10-29 MED ORDER — CIPROFLOXACIN IN D5W 400 MG/200ML IV SOLN
400.0000 mg | INTRAVENOUS | Status: AC
  Administered 2016-10-29: 400 mg via INTRAVENOUS

## 2016-10-29 MED ORDER — HYDROMORPHONE HCL 1 MG/ML IJ SOLN
INTRAMUSCULAR | Status: AC
  Filled 2016-10-29: qty 1

## 2016-10-29 MED ORDER — SODIUM CHLORIDE 0.9 % IR SOLN
Status: DC | PRN
  Administered 2016-10-29: 5000 mL

## 2016-10-29 MED ORDER — LACTATED RINGERS IV SOLN
INTRAVENOUS | Status: DC | PRN
  Administered 2016-10-29: 1000 mL
  Administered 2016-10-29: 07:00:00 via INTRAVENOUS

## 2016-10-29 MED ORDER — LIDOCAINE 2% (20 MG/ML) 5 ML SYRINGE
INTRAMUSCULAR | Status: AC
  Filled 2016-10-29: qty 5

## 2016-10-29 MED ORDER — BELLADONNA ALKALOIDS-OPIUM 16.2-60 MG RE SUPP
RECTAL | Status: DC | PRN
  Administered 2016-10-29: 1 via RECTAL

## 2016-10-29 MED ORDER — CIPROFLOXACIN HCL 500 MG PO TABS: 500.0000 mg | ORAL_TABLET | Freq: Once | ORAL | 0 refills | Status: AC

## 2016-10-29 MED ORDER — PROMETHAZINE HCL 25 MG/ML IJ SOLN
INTRAMUSCULAR | Status: AC
  Filled 2016-10-29: qty 1

## 2016-10-29 MED ORDER — MIRABEGRON ER 50 MG PO TB24: 50.0000 mg | ORAL_TABLET | Freq: Every day | ORAL | 0 refills | Status: DC

## 2016-10-29 MED ORDER — ACETAMINOPHEN 10 MG/ML IV SOLN
INTRAVENOUS | Status: DC | PRN
  Administered 2016-10-29: 1000 mg via INTRAVENOUS

## 2016-10-29 MED ORDER — PROPOFOL 10 MG/ML IV BOLUS
INTRAVENOUS | Status: DC | PRN
  Administered 2016-10-29: 200 mg via INTRAVENOUS

## 2016-10-29 MED ORDER — ACETAMINOPHEN 10 MG/ML IV SOLN
INTRAVENOUS | Status: AC
  Filled 2016-10-29: qty 100

## 2016-10-29 MED ORDER — TAMSULOSIN HCL 0.4 MG PO CAPS: 0.4000 mg | ORAL_CAPSULE | Freq: Every day | ORAL | 0 refills | Status: DC

## 2016-10-29 MED ORDER — FENTANYL CITRATE (PF) 100 MCG/2ML IJ SOLN
25.0000 ug | INTRAMUSCULAR | Status: DC | PRN
  Administered 2016-10-29 (×3): 50 ug via INTRAVENOUS

## 2016-10-29 MED ORDER — LIDOCAINE HCL 2 % EX GEL
CUTANEOUS | Status: DC | PRN
  Administered 2016-10-29: 1

## 2016-10-29 MED ORDER — PROMETHAZINE HCL 25 MG/ML IJ SOLN
6.2500 mg | INTRAMUSCULAR | Status: DC | PRN
  Administered 2016-10-29: 6.25 mg via INTRAVENOUS

## 2016-10-29 SURGICAL SUPPLY — 21 items
BAG URO CATCHER STRL LF (MISCELLANEOUS) ×2 IMPLANT
BASKET DAKOTA 1.9FR 11X120 (BASKET) ×2 IMPLANT
BASKET ZERO TIP NITINOL 2.4FR (BASKET) ×2 IMPLANT
CATH URET 5FR 28IN OPEN ENDED (CATHETERS) ×2 IMPLANT
CLOTH BEACON ORANGE TIMEOUT ST (SAFETY) ×2 IMPLANT
FIBER LASER FLEXIVA 1000 (UROLOGICAL SUPPLIES) IMPLANT
FIBER LASER FLEXIVA 365 (UROLOGICAL SUPPLIES) ×2 IMPLANT
FIBER LASER FLEXIVA 550 (UROLOGICAL SUPPLIES) IMPLANT
FIBER LASER TRAC TIP (UROLOGICAL SUPPLIES) IMPLANT
GLOVE BIOGEL M STRL SZ7.5 (GLOVE) ×2 IMPLANT
GOWN STRL REUS W/TWL XL LVL3 (GOWN DISPOSABLE) ×2 IMPLANT
GUIDEWIRE ANG ZIPWIRE 038X150 (WIRE) IMPLANT
GUIDEWIRE STR DUAL SENSOR (WIRE) ×2 IMPLANT
MANIFOLD NEPTUNE II (INSTRUMENTS) ×2 IMPLANT
PACK CYSTO (CUSTOM PROCEDURE TRAY) ×2 IMPLANT
SHEATH ACCESS URETERAL 24CM (SHEATH) IMPLANT
SHEATH ACCESS URETERAL 38CM (SHEATH) IMPLANT
SHEATH ACCESS URETERAL 54CM (SHEATH) IMPLANT
STENT PERCUFLEX 4.8FRX26 (STENTS) ×2 IMPLANT
TUBING CONNECTING 10 (TUBING) ×2 IMPLANT
WIRE COONS/BENSON .038X145CM (WIRE) IMPLANT

## 2016-10-29 NOTE — Anesthesia Procedure Notes (Signed)
Procedure Name: LMA Insertion Date/Time: 10/29/2016 7:43 AM Performed by: Glory Buff Pre-anesthesia Checklist: Patient identified, Emergency Drugs available, Suction available and Patient being monitored Patient Re-evaluated:Patient Re-evaluated prior to inductionOxygen Delivery Method: Circle system utilized Preoxygenation: Pre-oxygenation with 100% oxygen Intubation Type: IV induction LMA: LMA inserted LMA Size: 4.0 Number of attempts: 1 Placement Confirmation: positive ETCO2 Tube secured with: Tape

## 2016-10-29 NOTE — Anesthesia Postprocedure Evaluation (Signed)
Anesthesia Post Note  Patient: [name]  Procedure(s) Performed: Procedure(s) (LRB): CYSTOSCOPY WITH RETROGRADE PYELOGRAM, URETEROSCOPY AND STENT PLACEMENT (Right) HOLMIUM LASER APPLICATION (Right)  Patient location during evaluation: PACU Anesthesia Type: General Level of consciousness: awake and alert Pain management: pain level controlled Vital Signs Assessment: post-procedure vital signs reviewed and stable Respiratory status: spontaneous breathing, nonlabored ventilation, respiratory function stable and patient connected to nasal cannula oxygen Cardiovascular status: blood pressure returned to baseline and stable Postop Assessment: no signs of nausea or vomiting Anesthetic complications: no       Last Vitals:  Vitals:   10/29/16 0546 10/29/16 0911  BP: 119/75 136/74  Pulse: 95 98  Resp: 18 12  Temp: 36.4 C 36.8 C    Last Pain:  Vitals:   10/29/16 0546  TempSrc: Oral  PainSc:                  [name] S

## 2016-10-29 NOTE — Anesthesia Preprocedure Evaluation (Addendum)
Anesthesia Evaluation  Patient identified by MRN, date of birth, ID band Patient awake    Reviewed: Allergy & Precautions, NPO status , Patient's Chart, lab work & pertinent test results  Airway Mallampati: II  TM Distance: >3 FB Neck ROM: Full    Dental no notable dental hx.    Pulmonary neg pulmonary ROS,    Pulmonary exam normal breath sounds clear to auscultation       Cardiovascular negative cardio ROS Normal cardiovascular exam Rhythm:Regular Rate:Normal     Neuro/Psych negative neurological ROS  negative psych ROS   GI/Hepatic negative GI ROS, Neg liver ROS,   Endo/Other  negative endocrine ROS  Renal/GU negative Renal ROS  negative genitourinary   Musculoskeletal negative musculoskeletal ROS (+)   Abdominal   Peds negative pediatric ROS (+)  Hematology negative hematology ROS (+)   Anesthesia Other Findings   Reproductive/Obstetrics negative OB ROS                             Anesthesia Physical Anesthesia Plan  ASA: II  Anesthesia Plan: General   Post-op Pain Management:    Induction: Intravenous  Airway Management Planned: LMA  Additional Equipment:   Intra-op Plan:   Post-operative Plan: Extubation in OR  Informed Consent: I have reviewed the patients History and Physical, chart, labs and discussed the procedure including the risks, benefits and alternatives for the proposed anesthesia with the patient or authorized representative who has indicated his/her understanding and acceptance.   Dental advisory given  Plan Discussed with: CRNA and Surgeon  Anesthesia Plan Comments:         Anesthesia Quick Evaluation  

## 2016-10-29 NOTE — Discharge Instructions (Signed)
DISCHARGE INSTRUCTIONS FOR KIDNEY STONE/URETERAL STENT   MEDICATIONS:  1. Resume all your other meds from home.  2. Pyridium is to help with the burning/stinging when you urinate. 3. Intra-vaginal valium is to assist with pelvic pain/spasm   4. Take Cipro one hour prior to removal of your stent.  5. Myrbetriq is for overactive bladder/urinary frequency and bladder spasm. 6. Tamsulosin is to help prevent/reduce ureteral spasm.   ACTIVITY:  1. No strenuous activity x 1week  2. No driving while on narcotic pain medications  3. Drink plenty of water  4. Continue to walk at home - you can still get blood clots when you are at home, so keep active, but don't over do it.  5. May return to work/school tomorrow or when you feel ready   BATHING:  1. You can shower and we recommend daily showers  2. You have a string coming from your urethra: The stent string is attached to your ureteral stent. Do not pull on this.   SIGNS/SYMPTOMS TO CALL:  Please call us if you have a fever greater than 101.5, uncontrolled nausea/vomiting, uncontrolled pain, dizziness, unable to urinate, bloody urine, chest pain, shortness of breath, leg swelling, leg pain, redness around wound, drainage from wound, or any other concerns or questions.   You can reach [place] at [phone].   FOLLOW-UP:  1. You have an appointment in 6 weeks with a ultrasound of your kidneys prior.   2. You have a string attached to your stent, you may remove it on Jan 31st. To do this, pull the strings until the stents are completely removed. You may feel an odd sensation in your back.   Cystoscopy, Care After Refer to this sheet in the next few weeks. These instructions provide you with information about caring for yourself after your procedure. Your health care provider may also give you more specific instructions. Your treatment has been planned according to current medical practices, but problems sometimes occur. Call your health care  provider if you have any problems or questions after your procedure. What can I expect after the procedure? After the procedure, it is common to have:  Mild pain when you urinate. Pain should stop within a few minutes after you urinate. This may last for up to 1 week.  A small amount of blood in your urine for several days.  Feeling like you need to urinate but producing only a small amount of urine. Follow these instructions at home:   Medicines  Take over-the-counter and prescription medicines only as told by your health care provider.  If you were prescribed an antibiotic medicine, take it as told by your health care provider. Do not stop taking the antibiotic even if you start to feel better. General instructions  Return to your normal activities as told by your health care provider. Ask your health care provider what activities are safe for you.  Do not drive for 24 hours if you received a sedative.  Watch for any blood in your urine. If the amount of blood in your urine increases, call your health care provider.  Follow instructions from your health care provider about eating or drinking restrictions.  If a tissue sample was removed for testing (biopsy) during your procedure, it is your responsibility to get your test results. Ask your health care provider or the department performing the test when your results will be ready.  Drink enough fluid to keep your urine clear or pale yellow.  Keep all follow-up  visits as told by your health care provider. This is important. Contact a health care provider if:  You have pain that gets worse or does not get better with medicine, especially pain when you urinate.  You have difficulty urinating. Get help right away if:  You have more blood in your urine.  You have blood clots in your urine.  You have abdominal pain.  You have a fever or chills.  You are unable to urinate. This information is not intended to replace advice  given to you by your health care provider. Make sure you discuss any questions you have with your health care provider. Document Released: 04/09/2005 Document Revised: 02/26/2016 Document Reviewed: 08/07/2015 Elsevier Interactive Patient Education  2017 [employer].

## 2016-10-29 NOTE — Interval H&P Note (Signed)
History and Physical Interval Note:  10/29/2016 7:30 AM  [name]  has presented today for surgery, with the diagnosis of right ureteral stone  The various methods of treatment have been discussed with the patient and family. After consideration of risks, benefits and other options for treatment, the patient has consented to  Procedure(s): CYSTOSCOPY WITH RETROGRADE PYELOGRAM, URETEROSCOPY AND STENT PLACEMENT (Right) HOLMIUM LASER APPLICATION (Right) as a surgical intervention .  The patient's history has been reviewed, patient examined, no change in status, stable for surgery.  I have reviewed the patient's chart and labs.  Questions were answered to the patient's satisfaction.     Louis Meckel W

## 2016-10-29 NOTE — Op Note (Signed)
Preoperative diagnosis: right ureteral calculus  Postoperative diagnosis: right ureteral calculus  Procedure:  1. Cystoscopy 2. right ureteroscopy and stone removal 3. Ureteroscopic laser lithotripsy 4. right 4.28F x 26cm ureteral stent placement  5. right retrograde pyelography with interpretation  Surgeon: [name], MD  Anesthesia: General  Complications: None  Intraoperative findings: right retrograde pyelography demonstrated a filling defect within the right ureter consistent with the patient's known calculus without other abnormalities. In addition, the patient had a large portion of the renal pelvis that was noted to be full with blood clot.  EBL: Minimal  Specimens: 1. right ureteral calculus  Disposition of specimens: [facility] for stone analysis  Indication: [name] is a 41 y.o.   patient with a right ureteral stone and associated right symptoms. After reviewing the management options for treatment, the patient elected to proceed with the above surgical procedure(s). We have discussed the potential benefits and risks of the procedure, side effects of the proposed treatment, the likelihood of the patient achieving the goals of the procedure, and any potential problems that might occur during the procedure or recuperation. Informed consent has been obtained.   Description of procedure:  The patient was taken to the operating room and general anesthesia was induced.  The patient was placed in the dorsal lithotomy position, prepped and draped in the usual sterile fashion, and preoperative antibiotics were administered. A preoperative time-out was performed.   Cystourethroscopy was performed.  The patient's urethra was examined and was normal. The bladder was then systematically examined in its entirety. There was no evidence for any bladder tumors, stones, or other mucosal pathology.    Attention then turned to the right ureteral  orifice and the Polaris stent was grasped and pulled to the urethral meatus. A 0.038 French sensor wire was then advanced through the stent and into the right renal pelvis, the stent was then removed over the wire. I then performed a retrograde pyelogram with the above findings. This was done by passing a 5 [place] open-end over the stent and exchange. I then replaced the wire removing the catheter over the wire.  The 6 Fr semirigid ureteroscope was then advanced into the ureter next to the guidewire and the calculus was identified. The stone was noted to be at the right UPJ. There was a significant amount of clot within the ureter as well as the renal pelvis. Using a 0 tipped basket I was able to retrieve the stone and pulled it to the distal ureter. While in the distal ureter the stone was then fragmented with the 365 micron holmium laser fiber on a setting of 0.6 and frequency of 6 Hz.   All stones were then removed from the ureter with a 0 tip nitinol basket.  Reinspection of the ureter revealed no remaining visible stones or fragments. However, there was a lot of clot which I was able to retrieve. I then repassed the 5 [place] open-ended ureteral catheter over the wire and into the right renal pelvis. I then injected contrast to help aid with the stent placement. Notably, there was a large amount of clot within the renal pelvis.  The wire was then backloaded through the cystoscope and a ureteral stent was advance over the wire using Seldinger technique.  The stent was positioned appropriately under fluoroscopic and cystoscopic guidance.  The wire was then removed with an adequate stent curl noted in the renal pelvis as well as in the bladder.  The bladder was then  emptied and the procedure ended.  The patient appeared to tolerate the procedure well and without complications.  The patient was able to be awakened and transferred to the recovery unit in satisfactory condition.   Disposition: The tether of  the stent was left on and tucked inside the patient's vagina.  Instructions for removing the stent have been provided to the patient. The patient has been scheduled for followup in 6 weeks with a renal ultrasound.

## 2016-10-29 NOTE — Transfer of Care (Signed)
Immediate Anesthesia Transfer of Care Note  Patient: [name]  Procedure(s) Performed: Procedure(s): CYSTOSCOPY WITH RETROGRADE PYELOGRAM, URETEROSCOPY AND STENT PLACEMENT (Right) HOLMIUM LASER APPLICATION (Right)  Patient Location: PACU  Anesthesia Type:General  Level of Consciousness: awake, alert  and oriented  Airway & Oxygen Therapy: Patient Spontanous Breathing and Patient connected to face mask oxygen  Post-op Assessment: Report given to RN and Post -op Vital signs reviewed and stable  Post vital signs: Reviewed and stable  Last Vitals:  Vitals:   10/29/16 0546  BP: 119/75  Pulse: 95  Resp: 18  Temp: 36.4 C    Last Pain:  Vitals:   10/29/16 0546  TempSrc: Oral  PainSc:       Patients Stated Pain Goal: 4 ([id number])  Complications: No apparent anesthesia complications

## 2016-10-29 NOTE — H&P (View-Only) (Signed)
I have been asked to see the patient by Dr. Virgel Manifold, for evaluation and management of right obstructing ureteral stone.  History of present illness: 41 year old female with a history of nephrolithiasis who presented to the emergency department today with increasing right-sided abdominal pain and associated nausea. The pain began yesterday. She did have some emesis earlier today. She denies any associated right flank pain or suprapubic pain she's not had any voiding symptoms. She denies any dysuria. She denies any fevers/chills.  In the emergency department the patient was found to have a normal white blood cell count with normal renal function. Her urinalysis did demonstrate microscopic hematuria, but no clear evidence of infection. She subsequently underwent a CT scan which demonstrated a 1 cm right proximal ureteral stone with associated hydronephrosis.  The patient's past medical history is significant for brain tumor in she subsequently had 4 brain surgeries. I recently saw her in clinic for problems with her shunt contributing to some bladder symptoms. She is seeing Dr. Janice Norrie in the past for nephrolithiasis.  Review of systems: A 12 point comprehensive review of systems was obtained and is negative unless otherwise stated in the history of present illness.  Patient Active Problem List   Diagnosis Date Noted  . Asthma with acute exacerbation 01/23/2016  . Acute sinusitis 01/23/2016  . Allergic rhinitis 01/23/2016  . ILD (interstitial lung disease) ([place]) 07/30/2012  . Dyspnea 07/16/2012    Current Facility-Administered Medications on File Prior to Encounter  Medication Dose Route Frequency Provider Last Rate Last Dose  . predniSONE (DELTASONE) tablet 10 mg  10 mg Oral UD Adelina Mings, MD       Current Outpatient Prescriptions on File Prior to Encounter  Medication Sig Dispense Refill  . budesonide (PULMICORT FLEXHALER) 180 MCG/ACT inhaler Inhale 2 puffs into the lungs 2  (two) times daily. 3 each 3  . budesonide (RHINOCORT AQUA) 32 MCG/ACT nasal spray Place 1 spray into both nostrils daily. 3 Bottle 1  . cetirizine (ZYRTEC) 10 MG tablet TAKE ONE TABLET ONCE DAILY FOR RUNNY NOSE OR ITCHING. (Patient taking differently: TAKE 10 MG BY MOUTH EVERY EVENING FOR RUNNY NOSE OR ITCHING) 90 tablet 1  . Eszopiclone 3 MG TABS Take 3 mg by mouth at bedtime. Reported on 01/23/2016    . fish oil-omega-3 fatty acids 1000 MG capsule Take 1 g by mouth at bedtime. Reported on 01/23/2016    . HYDROmorphone (DILAUDID) 8 MG tablet Take 4 mg by mouth every 4 (four) hours as needed for severe pain (Kidney Stones). Reported on 01/23/2016    . omeprazole (PRILOSEC OTC) 20 MG tablet Take 20 mg by mouth at bedtime.    . ondansetron (ZOFRAN) 4 MG tablet Take 4 mg by mouth every 8 (eight) hours as needed for nausea or vomiting. Reported on 01/23/2016    . XOPENEX HFA 45 MCG/ACT inhaler USE 1 TO 2 INHALATIONS EVERY 4 HOURS AS NEEDED FOR WHEEZING (Patient not taking: Reported on 10/26/2016) 45 g 1    Past Medical History:  Diagnosis Date  . Anxiety   . Arthritis    arthritis in knees  . Asthma    asthma since a child. Well controlled  . Brain tumor ([place])   . 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 Kidney stone   . Migraine   . Pneumonia    last time 2 yr ago  Past Surgical History:  Procedure Laterality Date  . BRAIN SURGERY  2015   Brain surgery x 4 in 2015 at [place]    . CHOLECYSTECTOMY    . CYSTOSCOPY WITH RETROGRADE PYELOGRAM, URETEROSCOPY AND STENT PLACEMENT Right 10/18/2014   Procedure: CYSTOSCOPY WITH RETROGRADE PYELOGRAM, URETEROSCOPY, STONE EXTRACTION AND STENT PLACEMENT;  Surgeon: Arvil Persons, MD;  Location: [facility];  Service: Urology;  Laterality: Right;  . CYSTOSTOMY W/ STENT INSERTION     numerous times  . HOLMIUM LASER APPLICATION Right [id number]    Procedure: HOLMIUM LASER APPLICATION;  Surgeon: Arvil Persons, MD;  Location: [facility];  Service: Urology;  Laterality: Right;  . LASER ABLATION OF THE CERVIX      Social History  Substance Use Topics  . Smoking status: Never Smoker  . Smokeless tobacco: Never Used  . Alcohol use No    Family History  Problem Relation Age of Onset  . Allergies Father   . Heart disease Mother   . Clotting disorder Mother   . Heart disease Maternal Grandmother   . Heart disease Paternal Grandmother   . Cancer      grandparents  . Urticaria Neg Hx   . Immunodeficiency Neg Hx   . Eczema Neg Hx   . Atopy Neg Hx   . Asthma Neg Hx   . Angioedema Neg Hx   . Allergic rhinitis Neg Hx     PE: Vitals:   10/26/16 1530 10/26/16 1600 10/26/16 1637 10/26/16 1700  BP:  130/80 126/65 107/58  Pulse: 89 86 89 90  Resp:   19   Temp:      TempSrc:      SpO2: 99% 99% 100% 100%  Weight:      Height:       Patient appears to be in no acute distress  patient is alert and oriented x3 Atraumatic normocephalic head No cervical or supraclavicular lymphadenopathy appreciated No increased work of breathing, no audible wheezes/rhonchi Regular sinus rhythm/rate Abdomen is soft, nontender, nondistended, no CVA or suprapubic tenderness Lower extremities are symmetric without appreciable edema Grossly neurologically intact No identifiable skin lesions   Recent Labs  10/26/16 1217  WBC 7.4  HGB 13.1  HCT 39.0    Recent Labs  10/26/16 1217  NA 138  K 3.5  CL 107  CO2 23  GLUCOSE 105*  BUN 16  CREATININE 0.78  CALCIUM 9.4   No results for input(s): LABPT, INR in the last 72 hours. No results for input(s): LABURIN in the last 72 hours. Results for orders placed or performed during the hospital encounter of 12/01/13  Urine culture     Status: None   Collection Time: 12/01/13  4:04 PM  Result Value Ref Range Status   Specimen Description URINE, CATHETERIZED  Final   Special Requests NONE  Final    Culture  Setup Time   Final    12/01/2013 21:42 Performed at [place] Performed at [facility]  Final   Culture NO GROWTH Performed at [facility]  Final   Report Status 12/02/2013 FINAL  Final    Imaging: I have independently reviewed the patient's CT scan which demonstrates a 1 cm stone in the right proximal ureter with hydronephrosis.  Imp: The patient has a large right-sided stone that is obstructing and causing associated nausea vomiting and pain. In the ER, the patient's pain was very difficult to control,  likely at associated with her tolerance for pain medication. However, she does have a large stone that she is unlikely to pass in the near future. There is no evidence of infection.  Recommendations: I went over the treatment options with the patient including conservative management or stent placement. I advocated for stent placement given that she isn't a significant amount of pain in the size of the stone would require prolonged ureteral stone extraction. Pre-stenting prior to the procedure would make things significantly easier and likely a better outcome. As such, the patient would like to proceed with placement of a right ureteral stent with follow-up for second stage ureteroscopy in the coming weeks.   The patient will be discharged home following the procedure.  Louis Meckel W

## 2016-11-03 ENCOUNTER — Emergency Department (HOSPITAL_COMMUNITY): Payer: BLUE CROSS/BLUE SHIELD

## 2016-11-03 ENCOUNTER — Encounter (HOSPITAL_BASED_OUTPATIENT_CLINIC_OR_DEPARTMENT_OTHER): Admission: RE | Payer: Self-pay | Source: Ambulatory Visit

## 2016-11-03 ENCOUNTER — Encounter (HOSPITAL_COMMUNITY): Payer: Self-pay | Admitting: Emergency Medicine

## 2016-11-03 ENCOUNTER — Ambulatory Visit (HOSPITAL_BASED_OUTPATIENT_CLINIC_OR_DEPARTMENT_OTHER): Admission: RE | Admit: 2016-11-03 | Payer: BLUE CROSS/BLUE SHIELD | Source: Ambulatory Visit | Admitting: Urology

## 2016-11-03 ENCOUNTER — Inpatient Hospital Stay (HOSPITAL_COMMUNITY)
Admission: EM | Admit: 2016-11-03 | Discharge: 2016-11-07 | DRG: 699 | Disposition: A | Discharge status: Home / Self Care | Payer: BLUE CROSS/BLUE SHIELD | Attending: Urology | Admitting: Urology

## 2016-11-03 DIAGNOSIS — F419 Anxiety disorder, unspecified: Secondary | ICD-10-CM | POA: Diagnosis present

## 2016-11-03 DIAGNOSIS — Z79899 Other long term (current) drug therapy: Secondary | ICD-10-CM

## 2016-11-03 DIAGNOSIS — S37011A Minor contusion of right kidney, initial encounter: Principal | ICD-10-CM | POA: Diagnosis present

## 2016-11-03 DIAGNOSIS — R109 Unspecified abdominal pain: Secondary | ICD-10-CM | POA: Diagnosis present

## 2016-11-03 DIAGNOSIS — Z888 Allergy status to other drugs, medicaments and biological substances status: Secondary | ICD-10-CM

## 2016-11-03 DIAGNOSIS — G8929 Other chronic pain: Secondary | ICD-10-CM | POA: Diagnosis present

## 2016-11-03 DIAGNOSIS — S37019A Minor contusion of unspecified kidney, initial encounter: Secondary | ICD-10-CM | POA: Diagnosis present

## 2016-11-03 DIAGNOSIS — Y848 Other medical procedures as the cause of abnormal reaction of the patient, or of later complication, without mention of misadventure at the time of the procedure: Secondary | ICD-10-CM | POA: Diagnosis present

## 2016-11-03 DIAGNOSIS — J45901 Unspecified asthma with (acute) exacerbation: Secondary | ICD-10-CM

## 2016-11-03 DIAGNOSIS — M170 Bilateral primary osteoarthritis of knee: Secondary | ICD-10-CM | POA: Diagnosis present

## 2016-11-03 DIAGNOSIS — Z85841 Personal history of malignant neoplasm of brain: Secondary | ICD-10-CM

## 2016-11-03 DIAGNOSIS — J0110 Acute frontal sinusitis, unspecified: Secondary | ICD-10-CM | POA: Diagnosis present

## 2016-11-03 DIAGNOSIS — Z880 Allergy status to penicillin: Secondary | ICD-10-CM

## 2016-11-03 DIAGNOSIS — Z7951 Long term (current) use of inhaled steroids: Secondary | ICD-10-CM

## 2016-11-03 LAB — BASIC METABOLIC PANEL
ANION GAP: 8 (ref 5–15)
BUN: 9 mg/dL (ref 6–20)
CHLORIDE: 105 mmol/L (ref 101–111)
CO2: 27 mmol/L (ref 22–32)
Calcium: 9 mg/dL (ref 8.9–10.3)
Creatinine, Ser: 0.69 mg/dL (ref 0.44–1.00)
GFR calc Af Amer: >60 mL/min (ref >60)
Glucose, Bld: 92 mg/dL (ref 65–99)
POTASSIUM: 3.8 mmol/L (ref 3.5–5.1)
SODIUM: 140 mmol/L (ref 135–145)

## 2016-11-03 LAB — CBC WITH DIFFERENTIAL/PLATELET
BASOS ABS: 0 10*3/uL (ref 0.0–0.1)
Basophils Relative: 0 %
EOS ABS: 0.1 10*3/uL (ref 0.0–0.7)
EOS PCT: 2 %
HCT: 29.1 % — ABNORMAL LOW (ref 36.0–46.0)
HEMOGLOBIN: 9.7 g/dL — AB (ref 12.0–15.0)
LYMPHS PCT: 21 %
Lymphs Abs: 1.4 10*3/uL (ref 0.7–4.0)
MCH: 29.9 pg (ref 26.0–34.0)
MCHC: 33.3 g/dL (ref 30.0–36.0)
MCV: 89.8 fL (ref 78.0–100.0)
Monocytes Absolute: 0.8 10*3/uL (ref 0.1–1.0)
Monocytes Relative: 11 %
NEUTROS PCT: 66 %
Neutro Abs: 4.4 10*3/uL (ref 1.7–7.7)
PLATELETS: 184 10*3/uL (ref 150–400)
RBC: 3.24 MIL/uL — AB (ref 3.87–5.11)
RDW: 12.6 % (ref 11.5–15.5)
WBC: 6.7 10*3/uL (ref 4.0–10.5)

## 2016-11-03 LAB — URINALYSIS, ROUTINE W REFLEX MICROSCOPIC
Bilirubin Urine: NEGATIVE
Glucose, UA: NEGATIVE mg/dL
Ketones, ur: NEGATIVE mg/dL
NITRITE: NEGATIVE
PROTEIN: NEGATIVE mg/dL
SPECIFIC GRAVITY, URINE: 1.003 — AB (ref 1.005–1.030)
pH: 6 (ref 5.0–8.0)

## 2016-11-03 SURGERY — CYSTOURETEROSCOPY, WITH RETROGRADE PYELOGRAM AND STENT INSERTION
Anesthesia: General | Laterality: Right

## 2016-11-03 MED ORDER — ONDANSETRON HCL 4 MG/2ML IJ SOLN
4.0000 mg | Freq: Once | INTRAMUSCULAR | Status: AC
  Administered 2016-11-03: 4 mg via INTRAVENOUS
  Filled 2016-11-03: qty 2

## 2016-11-03 MED ORDER — HYDROMORPHONE HCL 1 MG/ML IJ SOLN
1.0000 mg | Freq: Once | INTRAMUSCULAR | Status: AC
  Administered 2016-11-03: 1 mg via INTRAVENOUS
  Filled 2016-11-03: qty 1

## 2016-11-03 MED ORDER — SODIUM CHLORIDE 0.9 % IV BOLUS (SEPSIS)
1000.0000 mL | Freq: Once | INTRAVENOUS | Status: AC
  Administered 2016-11-03: 1000 mL via INTRAVENOUS

## 2016-11-03 NOTE — ED Triage Notes (Signed)
Patient reports right sided flank pain and N/V since Monday. Reports she was admitted last week for kidney stone and had a stent placed. Reports since stent removal, pain has gotten progressively worse. Ambulatory to triage.

## 2016-11-03 NOTE — H&P (Signed)
H&P  Chief Complaint: Right abdominal pain  History of Present Illness: [name] is a 41 y.o. year old s/p right ureteroscopic laser lithotripsy and ureteral stent placement by Dr. Louis Meckel on 1/26.  She removed her stent as instructed on Monday and developed severe right flank and abdominal pain that evening.  This pain improved slightly but remained severe and she presented to the office yesterday.  She was given IM toradol but her pain never really improved.  She came to the ED tonight due to persistent unrelenting, uncontrolled pain.  She has had no fever, nausea, or vomiting.  A renal ultrasound in the ED indicated no hydronephrosis but did demonstrate a perinephric hematoma.  Her Hgb is 9.7.  She has been hemodynamically stable.    Past Medical History:  Diagnosis Date  . Anxiety   . Arthritis    arthritis in knees  . Asthma    asthma since a child. Well controlled  . Brain tumor ([place])   . 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 Kidney stone   . Migraine   . Pneumonia    last time 2 yr ago    Past Surgical History:  Procedure Laterality Date  . BRAIN SURGERY  2015   Brain surgery x 4 in 2015 at [facility]    . CHOLECYSTECTOMY    . CYSTOSCOPY W/ URETERAL STENT PLACEMENT Right 10/26/2016   Procedure: CYSTOSCOPY WITH RETROGRADE PYELOGRAM/URETERAL STENT PLACEMENT;  Surgeon: [name], MD;  Location: [facility];  Service: Urology;  Laterality: Right;  . CYSTOSCOPY WITH RETROGRADE PYELOGRAM, URETEROSCOPY AND STENT PLACEMENT Right 10/18/2014   Procedure: CYSTOSCOPY WITH RETROGRADE PYELOGRAM, URETEROSCOPY, STONE EXTRACTION AND STENT PLACEMENT;  Surgeon: Arvil Persons, MD;  Location: [facility];  Service: Urology;  Laterality: Right;  . CYSTOSCOPY WITH RETROGRADE PYELOGRAM, URETEROSCOPY AND STENT PLACEMENT Right 10/29/2016   Procedure: CYSTOSCOPY WITH RETROGRADE  PYELOGRAM, URETEROSCOPY AND STENT PLACEMENT;  Surgeon: [name], MD;  Location: [facility];  Service: Urology;  Laterality: Right;  . CYSTOSTOMY W/ STENT INSERTION     numerous times  . HOLMIUM LASER APPLICATION Right [id number]   Procedure: HOLMIUM LASER APPLICATION;  Surgeon: Arvil Persons, MD;  Location: [facility];  Service: Urology;  Laterality: Right;  . HOLMIUM LASER APPLICATION Right [id number]   Procedure: HOLMIUM LASER APPLICATION;  Surgeon: [name], MD;  Location: [facility];  Service: Urology;  Laterality: Right;  . LASER ABLATION OF THE CERVIX      Home Medications:  Current Facility-Administered Medications for the 11/03/16 encounter [facility] Encounter)  Medication  . predniSONE (DELTASONE) tablet 10 mg   Current Meds  Medication Sig  . budesonide (PULMICORT FLEXHALER) 180 MCG/ACT inhaler Inhale 2 puffs into the lungs 2 (two) times daily.  . budesonide (RHINOCORT AQUA) 32 MCG/ACT nasal spray Place 1 spray into both nostrils daily. (Patient taking differently: Place 1 spray into both nostrils every evening. )  . cetirizine (ZYRTEC) 10 MG tablet TAKE ONE TABLET ONCE DAILY FOR RUNNY NOSE OR ITCHING. (Patient taking differently: TAKE 10 MG BY MOUTH EVERY EVENING FOR RUNNY NOSE OR ITCHING)  . chlorzoxazone (PARAFON) 500 MG tablet Take 500 mg by mouth 2 (two) times daily as needed for muscle spasms.   . Cranberry 500 MG CAPS Take 1 capsule by mouth every evening.  . diazepam (VALIUM) 10 MG tablet  Take 1 tablet (10 mg total) by mouth every 8 (eight) hours as needed (spasm). Tablets are to be inserted deep into the patient's vagina to help with pelvic pain/spasm.  . Eszopiclone 3 MG TABS Take 3 mg by mouth at bedtime. Reported on 01/23/2016  . fish oil-omega-3 fatty acids 1000 MG capsule Take 1 g by mouth at bedtime. Reported on 01/23/2016  . HYDROmorphone (DILAUDID) 8 MG tablet Take 0.5 tablets (4 mg total) by mouth every 4 (four) hours as needed for severe pain (Kidney Stones).  Reported on 01/23/2016  . Meth-Hyo-M Bl-Na Phos-Ph Sal (URIBEL) 118 MG CAPS Take 1 capsule by mouth 4 (four) times daily.  . naproxen sodium (ANAPROX) 220 MG tablet Take 220 mg by mouth 2 (two) times daily with a meal.  . NUCYNTA ER 150 MG TB12 Take 150 mg by mouth every 12 (twelve) hours.  Marland Kitchen omeprazole (PRILOSEC OTC) 20 MG tablet Take 20 mg by mouth at bedtime.  . ondansetron (ZOFRAN) 8 MG tablet Take 8 mg by mouth every 8 (eight) hours as needed for nausea or vomiting.  . tamsulosin (FLOMAX) 0.4 MG CAPS capsule Take 1 capsule (0.4 mg total) by mouth daily.  . temazepam (RESTORIL) 15 MG capsule Take 15 mg by mouth at bedtime as needed for sleep.   Penne Lash HFA 45 MCG/ACT inhaler USE 1 TO 2 INHALATIONS EVERY 4 HOURS AS NEEDED FOR WHEEZING    Allergies:  Allergies  Allergen Reactions  . Penicillins Anaphylaxis    Has patient had a PCN reaction causing immediate rash, facial/tongue/throat swelling, SOB or lightheadedness with hypotension:  yes Has patient had a PCN reaction causing severe rash involving mucus membranes or skin necrosis:  no Has patient had a PCN reaction that required hospitalization: yes Has patient had a PCN reaction occurring within the last 10 years: no If all of the above answers are "NO", then may proceed with Cephalosporin use.   . Albuterol Other (See Comments)    Jittery   . Codeine Hives  . Compazine [Prochlorperazine Edisylate]     Panic attacks  . Midazolam Itching  . Betasept Surgical Scrub [Chlorhexidine Gluconate] Rash    Family History  Problem Relation Age of Onset  . Allergies Father   . Heart disease Mother   . Clotting disorder Mother   . Heart disease Maternal Grandmother   . Heart disease Paternal Grandmother   . Cancer      grandparents  . Urticaria Neg Hx   . Immunodeficiency Neg Hx   . Eczema Neg Hx   . Atopy Neg Hx   . Asthma Neg Hx   . Angioedema Neg Hx   . Allergic rhinitis Neg Hx     Social History:  reports that she has  never smoked. She has never used smokeless tobacco. She reports that she does not drink alcohol or use drugs.  ROS: A complete review of systems was performed.  All systems are negative except for pertinent findings as noted.  Physical Exam:  Vital signs in last 24 hours: Temp:  [97.6 F (36.4 C)] 97.6 F (36.4 C) (01/31 1745) Pulse Rate:  [88-106] 88 (01/31 2123) Resp:  [16-18] 16 (01/31 2123) BP: (113-129)/(60-92) 113/60 (01/31 2123) SpO2:  [99 %-100 %] 100 % (01/31 2123) Constitutional:  Alert and oriented, No acute distress Cardiovascular: Regular rate and rhythm, No JVD Respiratory: Normal respiratory effort, Lungs clear bilaterally GI: Severe right upper quadrant tenderness. GU: Mild right CVAT. Lymphatic: No lymphadenopathy Neurologic: Grossly intact,  no focal deficits Psychiatric: Normal mood and affect  Laboratory Data:   Recent Labs  11/03/16 1956  WBC 6.7  HGB 9.7*  HCT 29.1*  PLT 184     Recent Labs  11/03/16 1956  NA 140  K 3.8  CL 105  GLUCOSE 92  BUN 9  CALCIUM 9.0  CREATININE 0.69     Results for orders placed or performed during the hospital encounter of 11/03/16 (from the past 24 hour(s))  Urinalysis, Routine w reflex microscopic- may I&O cath if menses     Status: Abnormal   Collection Time: 11/03/16  5:54 PM  Result Value Ref Range   Color, Urine BLUE (A) YELLOW   APPearance HAZY (A) CLEAR   Specific Gravity, Urine 1.003 (L) 1.005 - 1.030   pH 6.0 5.0 - 8.0   Glucose, UA NEGATIVE NEGATIVE mg/dL   Hgb urine dipstick LARGE (A) NEGATIVE   Bilirubin Urine NEGATIVE NEGATIVE   Ketones, ur NEGATIVE NEGATIVE mg/dL   Protein, ur NEGATIVE NEGATIVE mg/dL   Nitrite NEGATIVE NEGATIVE   Leukocytes, UA LARGE (A) NEGATIVE   RBC / HPF 6-30 0 - 5 RBC/hpf   WBC, UA TOO NUMEROUS TO COUNT 0 - 5 WBC/hpf   Bacteria, UA RARE (A) NONE SEEN   Squamous Epithelial / LPF 0-5 (A) NONE SEEN   Mucous PRESENT   CBC with Differential     Status: Abnormal    Collection Time: 11/03/16  7:56 PM  Result Value Ref Range   WBC 6.7 4.0 - 10.5 K/uL   RBC 3.24 (L) 3.87 - 5.11 MIL/uL   Hemoglobin 9.7 (L) 12.0 - 15.0 g/dL   HCT 29.1 (L) 36.0 - 46.0 %   MCV 89.8 78.0 - 100.0 fL   MCH 29.9 26.0 - 34.0 pg   MCHC 33.3 30.0 - 36.0 g/dL   RDW 12.6 11.5 - 15.5 %   Platelets 184 150 - 400 K/uL   Neutrophils Relative % 66 %   Neutro Abs 4.4 1.7 - 7.7 K/uL   Lymphocytes Relative 21 %   Lymphs Abs 1.4 0.7 - 4.0 K/uL   Monocytes Relative 11 %   Monocytes Absolute 0.8 0.1 - 1.0 K/uL   Eosinophils Relative 2 %   Eosinophils Absolute 0.1 0.0 - 0.7 K/uL   Basophils Relative 0 %   Basophils Absolute 0.0 0.0 - 0.1 K/uL  Basic metabolic panel     Status: None   Collection Time: 11/03/16  7:56 PM  Result Value Ref Range   Sodium 140 135 - 145 mmol/L   Potassium 3.8 3.5 - 5.1 mmol/L   Chloride 105 101 - 111 mmol/L   CO2 27 22 - 32 mmol/L   Glucose, Bld 92 65 - 99 mg/dL   BUN 9 6 - 20 mg/dL   Creatinine, Ser 0.69 0.44 - 1.00 mg/dL   Calcium 9.0 8.9 - 10.3 mg/dL   GFR calc non Af Amer >60 >60 mL/min   GFR calc Af Amer >60 >60 mL/min   Anion gap 8 5 - 15   No results found for this or any previous visit (from the past 240 hour(s)).  Renal Function:  Recent Labs  11/03/16 1956  CREATININE 0.69   Estimated Creatinine Clearance: 112.2 mL/min (by C-G formula based on SCr of 0.69 mg/dL).  Radiologic Imaging: US Renal  Result Date: 11/03/2016 CLINICAL DATA:  Right flank pain and hematuria for 1 week. Right ureteral stone removal, lithotripsy and stent placement on 10/29/2016. EXAM: RENAL /  URINARY TRACT ULTRASOUND COMPLETE COMPARISON:  10/26/2016 CT FINDINGS: Right Kidney: Length: 12.8 cm. A moderate perinephric hematoma is noted measuring 2 cm and greatest transverse diameter. There is no evidence of hydronephrosis. Cortical thinning noted. Left Kidney: Length: 11.3 cm. Echogenicity within normal limits. No mass or hydronephrosis visualized. Cortical thinning  noted. Bladder: Appears normal for degree of bladder distention. IMPRESSION: Moderate right perinephric hematoma. No evidence of hydronephrosis. Electronically Signed   By: Margarette Canada M.D.   On: 11/03/2016 21:04    Impression/Assessment:  Right perinephric hematoma with uncontrolled pain  Plan:  Admit for observation.  IV pain management.  Monitor Hgb.  Gaurav Baldree,LES 11/03/2016, 11:12 PM  Pryor Curia MD

## 2016-11-03 NOTE — ED Provider Notes (Signed)
[facility] DEPT Provider Note   CSN: [id number] Arrival date & time: 11/03/16  1733     History   Chief Complaint Chief Complaint  Patient presents with  . Flank Pain    HPI [name] is a 41 y.o. female.  HPI   41 yo F with h/o recurrent kidney stones here with right flank pain. Pt just underwent ureteral stenting with Dr. Louis Meckel on 1/23 and 1/26. She had a stent in until this Monday which she removed at home. She states that since her first surgery, she has had severe, unrelenting flank pain. The pain is constant, wrose with movement, and more severe/different from her usual stent pain. No fevers. She is passing large volumes of clots in her urine still. No vomiting but has had nausea. Has been taking her meds as prescribed.  Past Medical History:  Diagnosis Date  . Anxiety   . Arthritis    arthritis in knees  . Asthma    asthma since a child. Well controlled  . Brain tumor ([place])   . 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 Kidney stone   . Migraine   . Pneumonia    last time 2 yr ago    Patient Active Problem List   Diagnosis Date Noted  . 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 Asthma with acute exacerbation 01/23/2016  . Acute sinusitis 01/23/2016  . Allergic rhinitis 01/23/2016  . ILD (interstitial lung disease) ([place]) 07/30/2012  . Dyspnea 07/16/2012    Past Surgical History:  Procedure Laterality Date  . BRAIN SURGERY  2015   Brain surgery x 4 in 2015 at [facility]    . CHOLECYSTECTOMY    . CYSTOSCOPY W/ URETERAL STENT PLACEMENT Right 10/26/2016   Procedure: CYSTOSCOPY WITH RETROGRADE PYELOGRAM/URETERAL STENT PLACEMENT;  Surgeon: [name], MD;  Location: [facility];  Service: Urology;  Laterality: Right;  . CYSTOSCOPY WITH RETROGRADE PYELOGRAM, URETEROSCOPY AND STENT PLACEMENT Right 10/18/2014   Procedure: CYSTOSCOPY WITH  RETROGRADE PYELOGRAM, URETEROSCOPY, STONE EXTRACTION AND STENT PLACEMENT;  Surgeon: Arvil Persons, MD;  Location: [facility];  Service: Urology;  Laterality: Right;  . CYSTOSCOPY WITH RETROGRADE PYELOGRAM, URETEROSCOPY AND STENT PLACEMENT Right 10/29/2016   Procedure: CYSTOSCOPY WITH RETROGRADE PYELOGRAM, URETEROSCOPY AND STENT PLACEMENT;  Surgeon: [name], MD;  Location[facility];  Service: Urology;  Laterality: Right;  . CYSTOSTOMY W/ STENT INSERTION     numerous times  . HOLMIUM LASER APPLICATION Right [id number]   Procedure: HOLMIUM LASER APPLICATION;  Surgeon: Arvil Persons, MD;  [facility];  Service: Urology;  Laterality: Right;  . HOLMIUM LASER APPLICATION Right [id number]   Procedure: HOLMIUM LASER APPLICATION;  Surgeon: [name], MD;  Location: [facility];  Service: Urology;  Laterality: Right;  . LASER ABLATION OF THE CERVIX      OB History    No data available       Home Medications    Prior to Admission medications   Medication Sig Start Date End Date Taking? Authorizing Provider  budesonide (PULMICORT FLEXHALER) 180 MCG/ACT inhaler Inhale 2 puffs into the lungs 2 (two) times daily. 06/30/16  Yes Valentina Shaggy, MD  budesonide (RHINOCORT AQUA) 32 MCG/ACT nasal spray Place 1 spray into both nostrils daily. Patient taking differently: Place 1 spray into both nostrils every evening.  07/30/15  Yes Roselyn Malachy Moan,  MD  cetirizine (ZYRTEC) 10 MG tablet TAKE ONE TABLET ONCE DAILY FOR RUNNY NOSE OR ITCHING. Patient taking differently: TAKE 10 MG BY MOUTH EVERY EVENING FOR RUNNY NOSE OR ITCHING 05/06/16  Yes Adelina Mings, MD  chlorzoxazone (PARAFON) 500 MG tablet Take 500 mg by mouth 2 (two) times daily as needed for muscle spasms.  09/30/16  Yes Historical Provider, MD  Cranberry 500 MG CAPS Take 1 capsule by mouth every evening.   Yes Historical Provider, MD  diazepam (VALIUM) 10 MG tablet Take 1 tablet (10 mg total) by mouth every 8 (eight) hours as needed  (spasm). Tablets are to be inserted deep into the patient's vagina to help with pelvic pain/spasm. 10/29/16  Yes [name], MD  Eszopiclone 3 MG TABS Take 3 mg by mouth at bedtime. Reported on 01/23/2016   Yes Historical Provider, MD  fish oil-omega-3 fatty acids 1000 MG capsule Take 1 g by mouth at bedtime. Reported on 01/23/2016   Yes Historical Provider, MD  HYDROmorphone (DILAUDID) 8 MG tablet Take 0.5 tablets (4 mg total) by mouth every 4 (four) hours as needed for severe pain (Kidney Stones). Reported on 01/23/2016 10/26/16  Yes [name], MD  Meth-Hyo-M Bl-Na Phos-Ph Sal (URIBEL) 118 MG CAPS Take 1 capsule by mouth 4 (four) times daily.   Yes Historical Provider, MD  naproxen sodium (ANAPROX) 220 MG tablet Take 220 mg by mouth 2 (two) times daily with a meal.   Yes Historical Provider, MD  NUCYNTA ER 150 MG TB12 Take 150 mg by mouth every 12 (twelve) hours. 09/30/16  Yes Historical Provider, MD  omeprazole (PRILOSEC OTC) 20 MG tablet Take 20 mg by mouth at bedtime.   Yes Historical Provider, MD  ondansetron (ZOFRAN) 8 MG tablet Take 8 mg by mouth every 8 (eight) hours as needed for nausea or vomiting.   Yes Historical Provider, MD  tamsulosin (FLOMAX) 0.4 MG CAPS capsule Take 1 capsule (0.4 mg total) by mouth daily. 10/29/16  Yes [name], MD  temazepam (RESTORIL) 15 MG capsule Take 15 mg by mouth at bedtime as needed for sleep.  07/29/16  Yes Historical Provider, MD  XOPENEX HFA 45 MCG/ACT inhaler USE 1 TO 2 INHALATIONS EVERY 4 HOURS AS NEEDED FOR WHEEZING 04/01/16  Yes Jiles Prows, MD  mirabegron ER (MYRBETRIQ) 50 MG TB24 tablet Take 1 tablet (50 mg total) by mouth daily. Patient not taking: Reported on 11/03/2016 10/29/16   [name], MD  phenazopyridine (PYRIDIUM) 200 MG tablet Take 1 tablet (200 mg total) by mouth 3 (three) times daily as needed for pain. Patient not taking: Reported on 11/03/2016 10/26/16   [name], MD    Family History Family  History  Problem Relation Age of Onset  . Allergies Father   . Heart disease Mother   . Clotting disorder Mother   . Heart disease Maternal Grandmother   . Heart disease Paternal Grandmother   . Cancer      grandparents  . Urticaria Neg Hx   . Immunodeficiency Neg Hx   . Eczema Neg Hx   . Atopy Neg Hx   . Asthma Neg Hx   . Angioedema Neg Hx   . Allergic rhinitis Neg Hx     Social History Social History  Substance Use Topics  . Smoking status: Never Smoker  . Smokeless tobacco: Never Used  . Alcohol use No     Allergies   Penicillins; Albuterol; Codeine; Compazine [prochlorperazine edisylate];  Midazolam; and Betasept surgical scrub [chlorhexidine gluconate]   Review of Systems Review of Systems  Constitutional: Positive for fatigue. Negative for fever.  Gastrointestinal: Positive for abdominal pain and nausea.  Genitourinary: Positive for flank pain and hematuria.  All other systems reviewed and are negative.    Physical Exam Updated Vital Signs BP 140/80 (BP Location: Left Arm)   Pulse 94   Temp 98.2 F (36.8 C) (Oral)   Resp 16   SpO2 100%   Physical Exam  Constitutional: She is oriented to person, place, and time. She appears well-developed and well-nourished. She appears distressed (appears uncomfortable, in pain).  HENT:  Head: Normocephalic and atraumatic.  Eyes: Conjunctivae are normal.  Neck: Neck supple.  Cardiovascular: Normal rate, regular rhythm and normal heart sounds.  Exam reveals no friction rub.   No murmur heard. Pulmonary/Chest: Effort normal and breath sounds normal. No respiratory distress. She has no wheezes. She has no rales.  Abdominal: Soft. She exhibits no distension. There is tenderness (RUQ, R flank). There is no rebound and no guarding.  Musculoskeletal: She exhibits no edema.  Neurological: She is alert and oriented to person, place, and time. She exhibits normal muscle tone.  Skin: Skin is warm. Capillary refill takes less  than 2 seconds.  Psychiatric: She has a normal mood and affect.  Nursing note and vitals reviewed.    ED Treatments / Results  Labs (all labs ordered are listed, but only abnormal results are displayed) Labs Reviewed  URINALYSIS, ROUTINE W REFLEX MICROSCOPIC - Abnormal; Notable for the following:       Result Value   Color, Urine BLUE (*)    APPearance HAZY (*)    Specific Gravity, Urine 1.003 (*)    Hgb urine dipstick LARGE (*)    Leukocytes, UA LARGE (*)    Bacteria, UA RARE (*)    Squamous Epithelial / LPF 0-5 (*)    All other components within normal limits  CBC WITH DIFFERENTIAL/PLATELET - Abnormal; Notable for the following:    RBC 3.24 (*)    Hemoglobin 9.7 (*)    HCT 29.1 (*)    All other components within normal limits  BASIC METABOLIC PANEL  HEMOGLOBIN AND HEMATOCRIT, BLOOD    EKG  EKG Interpretation None       Radiology US Renal  Result Date: 11/03/2016 CLINICAL DATA:  Right flank pain and hematuria for 1 week. Right ureteral stone removal, lithotripsy and stent placement on 10/29/2016. EXAM: RENAL / URINARY TRACT ULTRASOUND COMPLETE COMPARISON:  10/26/2016 CT FINDINGS: Right Kidney: Length: 12.8 cm. A moderate perinephric hematoma is noted measuring 2 cm and greatest transverse diameter. There is no evidence of hydronephrosis. Cortical thinning noted. Left Kidney: Length: 11.3 cm. Echogenicity within normal limits. No mass or hydronephrosis visualized. Cortical thinning noted. Bladder: Appears normal for degree of bladder distention. IMPRESSION: Moderate right perinephric hematoma. No evidence of hydronephrosis. Electronically Signed   By: Margarette Canada M.D.   On: 11/03/2016 21:04    Procedures Procedures (including critical care time)  Medications Ordered in ED Medications  temazepam (RESTORIL) capsule 15 mg (not administered)  budesonide (PULMICORT) 180 MCG/ACT inhaler 2 puff (2 puffs Inhalation Not Given 11/04/16 0128)  loratadine (CLARITIN) tablet 10 mg  (not administered)  levalbuterol (XOPENEX HFA) inhaler 1-2 puff (not administered)  predniSONE (DELTASONE) tablet 10 mg (not administered)  fluticasone (FLONASE) 50 MCG/ACT nasal spray 1 spray (not administered)  pantoprazole (PROTONIX) EC tablet 40 mg (not administered)  0.9 %  sodium chloride  infusion ( Intravenous New Bag/Given 11/04/16 0108)  HYDROmorphone (DILAUDID) injection 0.5-1 mg (1 mg Intravenous Given 11/04/16 0128)  diphenhydrAMINE (BENADRYL) injection 12.5-25 mg (not administered)    Or  diphenhydrAMINE (BENADRYL) 12.5 MG/5ML elixir 12.5-25 mg (not administered)  senna (SENOKOT) tablet 8.6 mg (not administered)  ondansetron (ZOFRAN) injection 4 mg (not administered)  sodium chloride 0.9 % bolus 1,000 mL (0 mLs Intravenous Stopped 11/03/16 2245)  HYDROmorphone (DILAUDID) injection 1 mg (1 mg Intravenous Given 11/03/16 1959)  ondansetron (ZOFRAN) injection 4 mg (4 mg Intravenous Given 11/03/16 1958)  HYDROmorphone (DILAUDID) injection 1 mg (1 mg Intravenous Given 11/03/16 2122)  sodium chloride 0.9 % bolus 1,000 mL (0 mLs Intravenous Stopped 11/04/16 0050)  HYDROmorphone (DILAUDID) injection 1 mg (1 mg Intravenous Given 11/03/16 2319)  ondansetron (ZOFRAN) injection 4 mg (4 mg Intravenous Given 11/03/16 2319)     Initial Impression / Assessment and Plan / ED Course  I have reviewed the triage vital signs and the nursing notes.  Pertinent labs & imaging results that were available during my care of the patient were reviewed by me and considered in my medical decision making (see chart for details).    41 yo F with h/o recurrent nephrolithiasis here with persistent R flank pain s/p stenting on 1/23 and 1/26. VSS. Lab work shows Hgb drop from 13 to 9 and renal U/S shows perinephric hematoma. UA shows pyuria which I suspect is reactive and she has no fevers to suggest pyelo/sepsis. D/w Dr. Alinda Money of Urology, will admit for pain control, trending H/H.  Final Clinical Impressions(s) / ED  Diagnoses   Final diagnoses:  Hematoma of right kidney, initial encounter  Right flank pain      Duffy Bruce, MD 11/04/16 [phone]

## 2016-11-04 ENCOUNTER — Encounter (HOSPITAL_COMMUNITY): Payer: Self-pay

## 2016-11-04 ENCOUNTER — Encounter: Payer: Self-pay | Admitting: Allergy & Immunology

## 2016-11-04 ENCOUNTER — Ambulatory Visit: Payer: BLUE CROSS/BLUE SHIELD | Admitting: Allergy & Immunology

## 2016-11-04 LAB — HEMOGLOBIN AND HEMATOCRIT, BLOOD
HEMATOCRIT: 26.5 % — AB (ref 36.0–46.0)
HEMATOCRIT: 27.1 % — AB (ref 36.0–46.0)
HEMOGLOBIN: 9.1 g/dL — AB (ref 12.0–15.0)
Hemoglobin: 9 g/dL — ABNORMAL LOW (ref 12.0–15.0)

## 2016-11-04 MED ORDER — ONDANSETRON HCL 4 MG/2ML IJ SOLN: 4.0000 mg | Freq: Four times a day (QID) | INTRAMUSCULAR | Status: DC | PRN

## 2016-11-04 MED ORDER — BUDESONIDE 32 MCG/ACT NA SUSP: 1.0000 | Freq: Every day | NASAL | Status: DC

## 2016-11-04 MED ORDER — LEVALBUTEROL HCL 0.63 MG/3ML IN NEBU: 0.6300 mg | INHALATION_SOLUTION | Freq: Four times a day (QID) | RESPIRATORY_TRACT | Status: DC | PRN

## 2016-11-04 MED ORDER — SODIUM CHLORIDE 0.9 % IV SOLN
INTRAVENOUS | Status: DC
  Administered 2016-11-04 (×2): via INTRAVENOUS
  Administered 2016-11-05: 1000 mL via INTRAVENOUS

## 2016-11-04 MED ORDER — DOCUSATE SODIUM 100 MG PO CAPS
100.0000 mg | ORAL_CAPSULE | Freq: Two times a day (BID) | ORAL | Status: DC
  Administered 2016-11-04 – 2016-11-07 (×7): 100 mg via ORAL
  Filled 2016-11-04 (×7): qty 1

## 2016-11-04 MED ORDER — TEMAZEPAM 15 MG PO CAPS
15.0000 mg | ORAL_CAPSULE | Freq: Every evening | ORAL | Status: DC | PRN
  Administered 2016-11-04 – 2016-11-06 (×4): 15 mg via ORAL
  Filled 2016-11-04 (×4): qty 1

## 2016-11-04 MED ORDER — TAMSULOSIN HCL 0.4 MG PO CAPS
0.4000 mg | ORAL_CAPSULE | Freq: Every day | ORAL | Status: DC
  Administered 2016-11-04 – 2016-11-07 (×4): 0.4 mg via ORAL
  Filled 2016-11-04 (×4): qty 1

## 2016-11-04 MED ORDER — ACETAMINOPHEN 10 MG/ML IV SOLN
1000.0000 mg | Freq: Four times a day (QID) | INTRAVENOUS | Status: AC
  Administered 2016-11-04 – 2016-11-05 (×4): 1000 mg via INTRAVENOUS
  Filled 2016-11-04 (×5): qty 100

## 2016-11-04 MED ORDER — HYDROMORPHONE HCL 2 MG/ML IJ SOLN
0.5000 mg | INTRAMUSCULAR | Status: DC | PRN
  Administered 2016-11-04 (×3): 1 mg via INTRAVENOUS
  Filled 2016-11-04 (×3): qty 1

## 2016-11-04 MED ORDER — LORATADINE 10 MG PO TABS: 10.0000 mg | ORAL_TABLET | Freq: Every day | ORAL | Status: DC

## 2016-11-04 MED ORDER — FLUTICASONE PROPIONATE 50 MCG/ACT NA SUSP
1.0000 | Freq: Every day | NASAL | Status: DC
  Filled 2016-11-04: qty 16

## 2016-11-04 MED ORDER — BUDESONIDE 180 MCG/ACT IN AEPB: 2.0000 | INHALATION_SPRAY | Freq: Two times a day (BID) | RESPIRATORY_TRACT | Status: DC

## 2016-11-04 MED ORDER — ONDANSETRON HCL 4 MG/2ML IJ SOLN
4.0000 mg | INTRAMUSCULAR | Status: DC | PRN
  Administered 2016-11-04: 4 mg via INTRAVENOUS
  Filled 2016-11-04: qty 2

## 2016-11-04 MED ORDER — TAPENTADOL HCL ER 50 MG PO TB12
150.0000 mg | ORAL_TABLET | Freq: Two times a day (BID) | ORAL | Status: DC
  Administered 2016-11-04 – 2016-11-07 (×7): 150 mg via ORAL
  Filled 2016-11-04 (×8): qty 3

## 2016-11-04 MED ORDER — PREDNISONE 10 MG PO TABS: 10.0000 mg | ORAL_TABLET | ORAL | Status: DC

## 2016-11-04 MED ORDER — SODIUM CHLORIDE 0.9% FLUSH: 9.0000 mL | INTRAVENOUS | Status: DC | PRN

## 2016-11-04 MED ORDER — DIPHENHYDRAMINE HCL 50 MG/ML IJ SOLN
12.5000 mg | Freq: Four times a day (QID) | INTRAMUSCULAR | Status: DC | PRN
Start: 2016-11-04 — End: 2016-11-05

## 2016-11-04 MED ORDER — ZOLPIDEM TARTRATE 5 MG PO TABS: 5.0000 mg | ORAL_TABLET | Freq: Every evening | ORAL | Status: DC | PRN

## 2016-11-04 MED ORDER — CETIRIZINE HCL 10 MG PO TABS
10.0000 mg | ORAL_TABLET | Freq: Every day | ORAL | Status: DC
  Administered 2016-11-05 – 2016-11-06 (×2): 10 mg via ORAL
  Filled 2016-11-04 (×4): qty 1

## 2016-11-04 MED ORDER — SENNA 8.6 MG PO TABS: 1.0000 | ORAL_TABLET | Freq: Two times a day (BID) | ORAL | Status: DC

## 2016-11-04 MED ORDER — BUDESONIDE 180 MCG/ACT IN AEPB
2.0000 | INHALATION_SPRAY | Freq: Two times a day (BID) | RESPIRATORY_TRACT | Status: DC
  Administered 2016-11-05 – 2016-11-07 (×2): 2 via RESPIRATORY_TRACT

## 2016-11-04 MED ORDER — DIPHENHYDRAMINE HCL 50 MG/ML IJ SOLN: 12.5000 mg | Freq: Four times a day (QID) | INTRAMUSCULAR | Status: DC | PRN

## 2016-11-04 MED ORDER — PANTOPRAZOLE SODIUM 40 MG PO TBEC
40.0000 mg | DELAYED_RELEASE_TABLET | Freq: Every day | ORAL | Status: DC
  Administered 2016-11-04 – 2016-11-06 (×4): 40 mg via ORAL
  Filled 2016-11-04 (×4): qty 1

## 2016-11-04 MED ORDER — DIPHENHYDRAMINE HCL 12.5 MG/5ML PO ELIX: 12.5000 mg | ORAL_SOLUTION | Freq: Four times a day (QID) | ORAL | Status: DC | PRN

## 2016-11-04 MED ORDER — NALOXONE HCL 0.4 MG/ML IJ SOLN
0.4000 mg | INTRAMUSCULAR | Status: DC | PRN
Start: 2016-11-04 — End: 2016-11-05

## 2016-11-04 MED ORDER — BUDESONIDE 0.5 MG/2ML IN SUSP
0.5000 mg | Freq: Two times a day (BID) | RESPIRATORY_TRACT | Status: DC
  Administered 2016-11-04: 0.5 mg via RESPIRATORY_TRACT
  Filled 2016-11-04 (×2): qty 2

## 2016-11-04 MED ORDER — LEVALBUTEROL TARTRATE 45 MCG/ACT IN AERO: 1.0000 | INHALATION_SPRAY | RESPIRATORY_TRACT | Status: DC | PRN

## 2016-11-04 MED ORDER — HYDROMORPHONE 1 MG/ML IV SOLN
INTRAVENOUS | Status: DC
  Administered 2016-11-04: 25 mg via INTRAVENOUS
  Administered 2016-11-04: 2.1 mg via INTRAVENOUS
  Administered 2016-11-04: 3.3 mg via INTRAVENOUS
  Administered 2016-11-04: 5.1 mg via INTRAVENOUS
  Administered 2016-11-05: 0.9 mg via INTRAVENOUS
  Administered 2016-11-05: 4.2 mg via INTRAVENOUS
  Filled 2016-11-04: qty 25

## 2016-11-04 MED ORDER — DIPHENHYDRAMINE HCL 12.5 MG/5ML PO ELIX
12.5000 mg | ORAL_SOLUTION | Freq: Four times a day (QID) | ORAL | Status: DC | PRN
  Administered 2016-11-05: 25 mg via ORAL
  Filled 2016-11-04: qty 10

## 2016-11-04 NOTE — Progress Notes (Signed)
Urology Inpatient Progress Report  Asthma with acute exacerbation [J45.901] Acute frontal sinusitis, recurrence not specified [J01.10]   Intv/Subj: Admitted with perinephric hematoma and pain IV pain medications not holding.  Active Problems:   Perinephric hematoma  Current Facility-Administered Medications  Medication Dose Route Frequency Provider Last Rate Last Dose  . 0.9 %  sodium chloride infusion   Intravenous Continuous Raynelle Bring, MD 75 mL/hr at 11/04/16 0108    . budesonide (PULMICORT) nebulizer solution 0.5 mg  0.5 mg Nebulization BID [name], MD   0.5 mg at 11/04/16 0233  . diphenhydrAMINE (BENADRYL) injection 12.5-25 mg  12.5-25 mg Intravenous Q6H PRN Raynelle Bring, MD       Or  . diphenhydrAMINE (BENADRYL) 12.5 MG/5ML elixir 12.5-25 mg  12.5-25 mg Oral Q6H PRN Raynelle Bring, MD      . fluticasone [facility]) 50 MCG/ACT nasal spray 1 spray  1 spray Each Nare Daily Raynelle Bring, MD      . levalbuterol [facility]) nebulizer solution 0.63 mg  0.63 mg Nebulization Q6H PRN [name], MD      . loratadine (CLARITIN) tablet 10 mg  10 mg Oral Daily Raynelle Bring, MD      . ondansetron [facility]) injection 4 mg  4 mg Intravenous Q4H PRN Raynelle Bring, MD      . pantoprazole (PROTONIX) EC tablet 40 mg  40 mg Oral QHS Raynelle Bring, MD   40 mg at 11/04/16 0201  . senna (SENOKOT) tablet 8.6 mg  1 tablet Oral BID Raynelle Bring, MD      . tamsulosin [facility]) capsule 0.4 mg  0.4 mg Oral Daily [name], MD      . tapentadol (NUCYNTA) 12 hr tablet 150 mg  150 mg Oral Q12H Raynelle Bring, MD   150 mg at 11/04/16 0601  . temazepam (RESTORIL) capsule 15 mg  15 mg Oral QHS PRN Raynelle Bring, MD   15 mg at 11/04/16 0201     Objective: Vital: Vitals:   11/03/16 2123 11/04/16 0100 11/04/16 0200 11/04/16 0616  BP: 113/60 140/80  113/66  Pulse: 88 94  93  Resp: 16 16  18   Temp:  98.2 F (36.8 C)  98.1 F (36.7 C)  TempSrc:  Oral  Oral  SpO2: 100% 100%  100%   Weight:   93 kg (205 lb)   Height:   5\' 9"  (1.753 m)    I/Os: I/O last 3 completed shifts: In: 2965 [P.O.:600; I.V.:365; IV Piggyback:2000] Out: 525 [Urine:525]  Physical Exam:  General: Patient is in no apparent distress  Lab Results:  Recent Labs  11/03/16 1956 11/04/16 0502  WBC 6.7  --   HGB 9.7* 9.0*  HCT 29.1* 26.5*    Recent Labs  11/03/16 1956  NA 140  K 3.8  CL 105  CO2 27  GLUCOSE 92  BUN 9  CREATININE 0.69  CALCIUM 9.0   No results for input(s): LABPT, INR in the last 72 hours. No results for input(s): LABURIN in the last 72 hours. Results for orders placed or performed during the hospital encounter of 12/01/13  Urine culture     Status: None   Collection Time: 12/01/13  4:04 PM  Result Value Ref Range Status   Specimen Description URINE, CATHETERIZED  Final   Special Requests NONE  Final   Culture  Setup Time   Final    12/01/2013 21:42 Performed at [place] Performed at [facility]  Final   Culture NO GROWTH Performed at [facility]  Final   Report Status 12/02/2013 FINAL  Final    Studies/Results: US Renal  Result Date: 11/03/2016 CLINICAL DATA:  Right flank pain and hematuria for 1 week. Right ureteral stone removal, lithotripsy and stent placement on 10/29/2016. EXAM: RENAL / URINARY TRACT ULTRASOUND COMPLETE COMPARISON:  10/26/2016 CT FINDINGS: Right Kidney: Length: 12.8 cm. A moderate perinephric hematoma is noted measuring 2 cm and greatest transverse diameter. There is no evidence of hydronephrosis. Cortical thinning noted. Left Kidney: Length: 11.3 cm. Echogenicity within normal limits. No mass or hydronephrosis visualized. Cortical thinning noted. Bladder: Appears normal for degree of bladder distention. IMPRESSION: Moderate right perinephric hematoma. No evidence of hydronephrosis. Electronically Signed   By: Margarette Canada M.D.   On: 11/03/2016 21:04    Assessment: Perinephric  hematoma, acute on chronic pain.  Plan: Transition to PCA IV APAP Pain consult if available Repeat Hgb at 3 pm Reassess pain tomorrow.   Louis Meckel, MD Urology 11/04/2016, 7:25 AM

## 2016-11-04 NOTE — Progress Notes (Signed)
Pt crying and c/o severe r flank pain.  Stating that she feels the need to void but only able to void small amts at a time. Bladder scan is 26cc.  Pt getting no relief from attempting to void.  Medicated for pain w/ minimal relief. Small blood clots in toilet w/ light green urine noted

## 2016-11-04 NOTE — Progress Notes (Signed)
Per RN, Dane aware that PT wishes to self administer Pulmicort inhaler.

## 2016-11-05 DIAGNOSIS — F419 Anxiety disorder, unspecified: Secondary | ICD-10-CM | POA: Diagnosis present

## 2016-11-05 DIAGNOSIS — S37011A Minor contusion of right kidney, initial encounter: Secondary | ICD-10-CM | POA: Diagnosis present

## 2016-11-05 DIAGNOSIS — J0110 Acute frontal sinusitis, unspecified: Secondary | ICD-10-CM | POA: Diagnosis present

## 2016-11-05 DIAGNOSIS — Z888 Allergy status to other drugs, medicaments and biological substances status: Secondary | ICD-10-CM | POA: Diagnosis not present

## 2016-11-05 DIAGNOSIS — R109 Unspecified abdominal pain: Secondary | ICD-10-CM | POA: Diagnosis present

## 2016-11-05 DIAGNOSIS — Y848 Other medical procedures as the cause of abnormal reaction of the patient, or of later complication, without mention of misadventure at the time of the procedure: Secondary | ICD-10-CM | POA: Diagnosis present

## 2016-11-05 DIAGNOSIS — Z7951 Long term (current) use of inhaled steroids: Secondary | ICD-10-CM | POA: Diagnosis not present

## 2016-11-05 DIAGNOSIS — J45901 Unspecified asthma with (acute) exacerbation: Secondary | ICD-10-CM | POA: Diagnosis present

## 2016-11-05 DIAGNOSIS — Z85841 Personal history of malignant neoplasm of brain: Secondary | ICD-10-CM | POA: Diagnosis not present

## 2016-11-05 DIAGNOSIS — Z880 Allergy status to penicillin: Secondary | ICD-10-CM | POA: Diagnosis not present

## 2016-11-05 DIAGNOSIS — M170 Bilateral primary osteoarthritis of knee: Secondary | ICD-10-CM | POA: Diagnosis present

## 2016-11-05 DIAGNOSIS — G8929 Other chronic pain: Secondary | ICD-10-CM | POA: Diagnosis present

## 2016-11-05 DIAGNOSIS — Z79899 Other long term (current) drug therapy: Secondary | ICD-10-CM | POA: Diagnosis not present

## 2016-11-05 MED ORDER — POLYETHYLENE GLYCOL 3350 17 G PO PACK
17.0000 g | PACK | Freq: Every day | ORAL | Status: DC
  Administered 2016-11-05: 17 g via ORAL
  Filled 2016-11-05: qty 1

## 2016-11-05 MED ORDER — HYDROMORPHONE HCL 4 MG PO TABS
12.0000 mg | ORAL_TABLET | ORAL | Status: DC | PRN
  Administered 2016-11-05: 6 mg via ORAL
  Administered 2016-11-05: 12 mg via ORAL
  Administered 2016-11-06 (×4): 6 mg via ORAL
  Filled 2016-11-05 (×6): qty 6

## 2016-11-05 MED ORDER — HYDROMORPHONE HCL 2 MG/ML IJ SOLN
0.5000 mg | INTRAMUSCULAR | Status: DC | PRN
  Administered 2016-11-05 – 2016-11-06 (×3): 0.5 mg via INTRAVENOUS
  Filled 2016-11-05 (×3): qty 1

## 2016-11-05 MED ORDER — HYDROMORPHONE HCL 2 MG PO TABS
4.0000 mg | ORAL_TABLET | ORAL | Status: DC | PRN
  Administered 2016-11-05: 8 mg via ORAL
  Filled 2016-11-05: qty 4

## 2016-11-05 MED ORDER — HYDROMORPHONE HCL 2 MG/ML IJ SOLN
0.5000 mg | INTRAMUSCULAR | Status: DC | PRN
  Administered 2016-11-05: 0.5 mg via INTRAVENOUS
  Filled 2016-11-05: qty 1

## 2016-11-05 NOTE — Progress Notes (Signed)
The patient's pain as returned without the PCA pump and the IV Tylenol.  Our plan is to increase or Dilaudid to 12 mg by mouth every 4 hours.  I also increased her breakthrough IV Dilaudid pain medication.  In addition, I have spoken with palliative care, and we will await their suggestions or recommendations as they have any.

## 2016-11-05 NOTE — Progress Notes (Signed)
Urology Inpatient Progress Report  Asthma with acute exacerbation [J45.901] Acute frontal sinusitis, recurrence not specified [J01.10]   Intv/Subj: Admitted with perinephric hematoma and pain Pain seems to be improving, intermittent episodes of clot colic.   Using the less dilaudid from her PCA.  Active Problems:   Perinephric hematoma  Current Facility-Administered Medications  Medication Dose Route Frequency Provider Last Rate Last Dose  . budesonide (PULMICORT) 180 MCG/ACT inhaler 2 puff  2 puff Inhalation BID Minda Ditto, RPH   2 puff at 11/05/16 0830  . budesonide (RHINOCORT AQUA) nasal spray 1 spray  1 spray Each Nare Daily [name], MD      . cetirizine (ZYRTEC) tablet 10 mg  10 mg Oral Daily [name], MD      . diphenhydrAMINE (BENADRYL) injection 12.5-25 mg  12.5-25 mg Intravenous Q6H PRN Raynelle Bring, MD       Or  . diphenhydrAMINE (BENADRYL) 12.5 MG/5ML elixir 12.5-25 mg  12.5-25 mg Oral Q6H PRN Raynelle Bring, MD      . docusate sodium (COLACE) capsule 100 mg  100 mg Oral BID Raynelle Bring, MD   100 mg at 11/04/16 2109  . HYDROmorphone (DILAUDID) injection 0.5 mg  0.5 mg Intravenous Q2H PRN [name], MD      . HYDROmorphone (DILAUDID) tablet 4-8 mg  4-8 mg Oral Q4H PRN [name], MD      . levalbuterol [facility]) nebulizer solution 0.63 mg  0.63 mg Nebulization Q6H PRN [name], MD      . pantoprazole (PROTONIX) EC tablet 40 mg  40 mg Oral QHS Raynelle Bring, MD   40 mg at 11/04/16 2109  . tamsulosin (FLOMAX) capsule 0.4 mg  0.4 mg Oral Daily [name], MD   0.4 mg at 11/04/16 [id number]  . tapentadol (NUCYNTA) 12 hr tablet 150 mg  150 mg Oral Q12H Raynelle Bring, MD   150 mg at 11/05/16 0542  . temazepam (RESTORIL) capsule 15 mg  15 mg Oral QHS PRN Raynelle Bring, MD   15 mg at 11/04/16 2240     Objective: Vital: Vitals:   11/05/16 0550 11/05/16 0734 11/05/16 0830 11/05/16 0835  BP: 133/70     Pulse: 95     Resp: 18  18    Temp: 97.7 F (36.5 C)     TempSrc: Axillary     SpO2: 99% 90% 93% 93%  Weight:      Height:       I/Os: I/O last 3 completed shifts: In: 6256.3 [P.O.:1680; I.V.:2376.3; IV Piggyback:2200] Out: 5425 [Urine:5425]  Physical Exam:  General: Patient is in no apparent distress  Lab Results:  Recent Labs  11/03/16 1956 11/04/16 0502 11/04/16 1513  WBC 6.7  --   --   HGB 9.7* 9.0* 9.1*  HCT 29.1* 26.5* 27.1*    Recent Labs  11/03/16 1956  NA 140  K 3.8  CL 105  CO2 27  GLUCOSE 92  BUN 9  CREATININE 0.69  CALCIUM 9.0   No results for input(s): LABPT, INR in the last 72 hours. No results for input(s): LABURIN in the last 72 hours. Results for orders placed or performed during the hospital encounter of 12/01/13  Urine culture     Status: None   Collection Time: 12/01/13  4:04 PM  Result Value Ref Range Status   Specimen Description URINE, CATHETERIZED  Final   Special Requests NONE  Final   Culture  Setup Time  Final    12/01/2013 21:42 Performed at [place] Performed at [facility]  Final   Culture NO GROWTH Performed at [facility]  Final   Report Status 12/02/2013 FINAL  Final    Studies/Results: US Renal  Result Date: 11/03/2016 CLINICAL DATA:  Right flank pain and hematuria for 1 week. Right ureteral stone removal, lithotripsy and stent placement on 10/29/2016. EXAM: RENAL / URINARY TRACT ULTRASOUND COMPLETE COMPARISON:  10/26/2016 CT FINDINGS: Right Kidney: Length: 12.8 cm. A moderate perinephric hematoma is noted measuring 2 cm and greatest transverse diameter. There is no evidence of hydronephrosis. Cortical thinning noted. Left Kidney: Length: 11.3 cm. Echogenicity within normal limits. No mass or hydronephrosis visualized. Cortical thinning noted. Bladder: Appears normal for degree of bladder distention. IMPRESSION: Moderate right perinephric hematoma. No evidence of hydronephrosis.  Electronically Signed   By: Margarette Canada M.D.   On: 11/03/2016 21:04    Assessment: Perinephric hematoma, acute on chronic pain.  Hgb stable.  Plan: Attempting to transition to oral pain medications Re-evaluate patient in PM for discharge.  Louis Meckel, MD Urology 11/05/2016, 9:21 AM

## 2016-11-06 MED ORDER — ACETAMINOPHEN 325 MG PO TABS
650.0000 mg | ORAL_TABLET | Freq: Four times a day (QID) | ORAL | Status: DC
  Administered 2016-11-06 – 2016-11-07 (×5): 650 mg via ORAL
  Filled 2016-11-06 (×5): qty 2

## 2016-11-06 MED ORDER — POLYETHYLENE GLYCOL 3350 17 G PO PACK
17.0000 g | PACK | Freq: Every day | ORAL | Status: DC | PRN
  Administered 2016-11-06 – 2016-11-07 (×2): 17 g via ORAL
  Filled 2016-11-06 (×2): qty 1

## 2016-11-06 NOTE — Progress Notes (Signed)
Urology Inpatient Progress Report  Asthma with acute exacerbation [J45.901] Acute frontal sinusitis, recurrence not specified [J01.10]   Intv/Subj: Admitted with perinephric hematoma and pain. Pain is significant when passing clots but otherwise well controlled. Clots are becoming less frequent. No fevers. Urine has been clear except for clots.  ROS: -fever/chills, - N/V.  Active Problems:   Perinephric hematoma  Current Facility-Administered Medications  Medication Dose Route Frequency Provider Last Rate Last Dose  . acetaminophen (TYLENOL) tablet 650 mg  650 mg Oral Q6H Lolita Rieger, MD      . budesonide (PULMICORT) 180 MCG/ACT inhaler 2 puff  2 puff Inhalation BID Minda Ditto, RPH   2 puff at 11/05/16 0830  . budesonide (RHINOCORT AQUA) nasal spray 1 spray  1 spray Each Nare Daily [name], MD      . cetirizine (ZYRTEC) tablet 10 mg  10 mg Oral Daily [name], MD   10 mg at 11/05/16 1938  . diphenhydrAMINE (BENADRYL) injection 12.5-25 mg  12.5-25 mg Intravenous Q6H PRN Raynelle Bring, MD       Or  . diphenhydrAMINE (BENADRYL) 12.5 MG/5ML elixir 12.5-25 mg  12.5-25 mg Oral Q6H PRN Raynelle Bring, MD   25 mg at 11/05/16 1025  . docusate sodium (COLACE) capsule 100 mg  100 mg Oral BID Raynelle Bring, MD   100 mg at 11/05/16 1948  . HYDROmorphone (DILAUDID) injection 0.5-1 mg  0.5-1 mg Intravenous Q2H PRN [name], MD   0.5 mg at 11/06/16 0739  . HYDROmorphone (DILAUDID) tablet 12 mg  12 mg Oral Q4H PRN [name], MD   6 mg at 11/06/16 0704  . levalbuterol (XOPENEX) nebulizer solution 0.63 mg  0.63 mg Nebulization Q6H PRN [name], MD      . pantoprazole (PROTONIX) EC tablet 40 mg  40 mg Oral QHS Raynelle Bring, MD   40 mg at 11/05/16 1948  . polyethylene glycol (MIRALAX / GLYCOLAX) packet 17 g  17 g Oral Daily PRN [name], MD      . tamsulosin [facility]) capsule 0.4 mg  0.4 mg Oral Daily [name], MD   0.4 mg at 11/05/16  1025  . tapentadol (NUCYNTA) 12 hr tablet 150 mg  150 mg Oral Q12H Raynelle Bring, MD   150 mg at 11/06/16 [id number]  . temazepam (RESTORIL) capsule 15 mg  15 mg Oral QHS PRN Raynelle Bring, MD   15 mg at 11/05/16 2200     Objective: Vital: Vitals:   11/05/16 1415 11/05/16 1900 11/05/16 2100 11/06/16 0630  BP: 135/67 138/76 135/77 (!) 167/52  Pulse: (!) 103 (!) 105 91 88  Resp: 16 16 18 16   Temp: 98.1 F (36.7 C) 97.9 F (36.6 C) 98 F (36.7 C) 99.3 F (37.4 C)  TempSrc: Oral Oral Oral Oral  SpO2: 97% 98% 95% 97%  Weight:      Height:       I/Os: I/O last 3 completed shifts: In: 2356.3 [P.O.:1220; I.V.:1136.3] Out: 6950 [Urine:6950]  Physical Exam:  General: Patient is in no apparent distress Abd: Minimal right CVAT.   Lab Results:  Recent Labs  11/03/16 1956 11/04/16 0502 11/04/16 1513  WBC 6.7  --   --   HGB 9.7* 9.0* 9.1*  HCT 29.1* 26.5* 27.1*    Recent Labs  11/03/16 1956  NA 140  K 3.8  CL 105  CO2 27  GLUCOSE 92  BUN 9  CREATININE 0.69  CALCIUM  9.0   No results for input(s): LABPT, INR in the last 72 hours. No results for input(s): LABURIN in the last 72 hours. Results for orders placed or performed during the hospital encounter of 12/01/13  Urine culture     Status: None   Collection Time: 12/01/13  4:04 PM  Result Value Ref Range Status   Specimen Description URINE, CATHETERIZED  Final   Special Requests NONE  Final   Culture  Setup Time   Final    12/01/2013 21:42 Performed at [place] Performed at [facility]  Final   Culture NO GROWTH Performed at [facility]  Final   Report Status 12/02/2013 FINAL  Final    Studies/Results: No results found.  Assessment: Perinephric hematoma, acute on chronic pain.  Hgb stable.  Plan: Will add oral Tylenol scheduled for clot colic and leave other pain medications unchanged. As clots become less frequent, expect pain should improve. Hopeful  discharge tomorrow Will avoid NSAIDs given recent bleeding/perinephric hematoma

## 2016-11-06 NOTE — Progress Notes (Signed)
Patient ambulated in hallway several times today.  No signs of dizziness or unsteadiness noted.

## 2016-11-06 NOTE — Progress Notes (Signed)
Patient having pain 8/10 this morning.  Dilaudid pain med given.  Observed small kidney stone passed this morning.  Mother in room with patient.

## 2016-11-07 MED ORDER — CETIRIZINE HCL 10 MG PO TABS: 10.0000 mg | ORAL_TABLET | Freq: Every day | ORAL | Status: DC

## 2016-11-07 MED ORDER — BUDESONIDE 32 MCG/ACT NA SUSP: 1.0000 | Freq: Every day | NASAL | Status: DC

## 2016-11-07 MED ORDER — HYDROMORPHONE HCL 4 MG PO TABS: ORAL_TABLET | ORAL | 0 refills | Status: AC

## 2016-11-07 MED ORDER — HYDROMORPHONE HCL 4 MG PO TABS
12.0000 mg | ORAL_TABLET | ORAL | Status: DC | PRN
  Administered 2016-11-07 (×3): 6 mg via ORAL
  Filled 2016-11-07 (×3): qty 3

## 2016-11-07 MED ORDER — ONDANSETRON HCL 4 MG PO TABS
4.0000 mg | ORAL_TABLET | ORAL | Status: DC | PRN
  Administered 2016-11-07: 4 mg via ORAL
  Filled 2016-11-07: qty 1

## 2016-11-07 NOTE — Discharge Summary (Signed)
Date of admission: 11/03/2016  Date of discharge: 11/07/2016  Admission diagnosis:  right perinephric hematoma  Discharge diagnosis:  right perinephric hematoma  Secondary diagnoses: none  History and Physical: For full details, please see admission history and physical. Briefly, [name] is a 41 y.o. year old patient with  right perinephric hematoma   Hospital Course: 41 y.o. year old s/p right ureteroscopic laser lithotripsy and ureteral stent placement by Dr. Louis Meckel on 1/26 admitted with right perinephric hematoma on 11/03/16 and pain control. Her hemoglobin remained stable over several days and her pain medication was titrated until she was comfortable. Her pain episodes appeared to correlated with clot colic. On 11/07/16 she was doing better with pain control and was discharge with Dilaudid 4mg  tablets to take 1-3 tablets every 6 hours as needed. She will need to follow up closely with her pain management physicians.  Laboratory values:  Recent Labs  11/04/16 1513  HGB 9.1*  HCT 27.1*   No results for input(s): CREATININE in the last 72 hours.  Disposition: Home  Discharge instruction: The patient was instructed to be ambulatory but told to refrain from heavy lifting, strenuous activity, or driving.  Discharge medications:  Allergies as of 11/07/2016      Reactions   Penicillins Anaphylaxis   Has patient had a PCN reaction causing immediate rash, facial/tongue/throat swelling, SOB or lightheadedness with hypotension:  yes Has patient had a PCN reaction causing severe rash involving mucus membranes or skin necrosis:  no Has patient had a PCN reaction that required hospitalization: yes Has patient had a PCN reaction occurring within the last 10 years: no If all of the above answers are "NO", then may proceed with Cephalosporin use.   Albuterol Other (See Comments)   Jittery    Codeine Hives   Compazine [prochlorperazine Edisylate]    Panic attacks   Midazolam Itching    Betasept Surgical Scrub [chlorhexidine Gluconate] Rash      Medication List    TAKE these medications   budesonide 180 MCG/ACT inhaler Commonly known as:  PULMICORT FLEXHALER Inhale 2 puffs into the lungs 2 (two) times daily.   budesonide 32 MCG/ACT nasal spray Commonly known as:  RHINOCORT AQUA Place 1 spray into both nostrils daily. What changed:  when to take this   cetirizine 10 MG tablet Commonly known as:  ZYRTEC TAKE ONE TABLET ONCE DAILY FOR RUNNY NOSE OR ITCHING. What changed:  See the new instructions.   chlorzoxazone 500 MG tablet Commonly known as:  PARAFON Take 500 mg by mouth 2 (two) times daily as needed for muscle spasms.   Cranberry 500 MG Caps Take 1 capsule by mouth every evening.   diazepam 10 MG tablet Commonly known as:  VALIUM Take 1 tablet (10 mg total) by mouth every 8 (eight) hours as needed (spasm). Tablets are to be inserted deep into the patient's vagina to help with pelvic pain/spasm.   Eszopiclone 3 MG Tabs Take 3 mg by mouth at bedtime. Reported on 01/23/2016   fish oil-omega-3 fatty acids 1000 MG capsule Take 1 g by mouth at bedtime. Reported on 01/23/2016   HYDROmorphone 4 MG tablet Commonly known as:  DILAUDID Take 1-3 tablets every 6 hours as needed for pain What changed:  medication strength  how much to take  how to take this  when to take this  reasons to take this  additional instructions   mirabegron ER 50 MG Tb24 tablet Commonly known as:  MYRBETRIQ Take 1 tablet (  50 mg total) by mouth daily.   naproxen sodium 220 MG tablet Commonly known as:  ANAPROX Take 220 mg by mouth 2 (two) times daily with a meal.   NUCYNTA ER 150 MG Tb12 Generic drug:  Tapentadol HCl Take 150 mg by mouth every 12 (twelve) hours.   omeprazole 20 MG tablet Commonly known as:  PRILOSEC OTC Take 20 mg by mouth at bedtime.   phenazopyridine 200 MG tablet Commonly known as:  PYRIDIUM Take 1 tablet (200 mg total) by mouth 3 (three)  times daily as needed for pain.   tamsulosin 0.4 MG Caps capsule Commonly known as:  FLOMAX Take 1 capsule (0.4 mg total) by mouth daily.   temazepam 15 MG capsule Commonly known as:  RESTORIL Take 15 mg by mouth at bedtime as needed for sleep.   URIBEL 118 MG Caps Take 1 capsule by mouth 4 (four) times daily.   XOPENEX HFA 45 MCG/ACT inhaler Generic drug:  levalbuterol USE 1 TO 2 INHALATIONS EVERY 4 HOURS AS NEEDED FOR WHEEZING   ZOFRAN 8 MG tablet Generic drug:  ondansetron Take 8 mg by mouth every 8 (eight) hours as needed for nausea or vomiting.       Followup: She will follow up for post op check.

## 2016-11-10 ENCOUNTER — Other Ambulatory Visit: Payer: Self-pay | Admitting: Allergy

## 2016-11-10 MED ORDER — CETIRIZINE HCL 10 MG PO TABS: ORAL_TABLET | ORAL | 0 refills | Status: DC

## 2016-12-01 ENCOUNTER — Other Ambulatory Visit: Payer: Self-pay | Admitting: *Deleted

## 2016-12-01 MED ORDER — CETIRIZINE HCL 10 MG PO TABS: ORAL_TABLET | ORAL | 0 refills | Status: DC

## 2017-03-07 NOTE — Addendum Note (Signed)
Addendum  created 03/07/17 1027 by Myrtie Soman, MD   Sign clinical note

## 2017-03-07 NOTE — Anesthesia Postprocedure Evaluation (Signed)
Anesthesia Post Note  Patient: [name]  Procedure(s) Performed: Procedure(s) (LRB): CYSTOSCOPY WITH RETROGRADE PYELOGRAM, URETEROSCOPY AND STENT PLACEMENT (Right) HOLMIUM LASER APPLICATION (Right)     Anesthesia Type: General    Last Vitals:  Vitals:   10/29/16 1045 10/29/16 1155  BP:  133/71  Pulse: 85 82  Resp: 12 15  Temp:  36.6 C    Last Pain:  Vitals:   11/01/16 0846  TempSrc:   PainSc: 5                  Peja Allender S

## 2017-04-21 ENCOUNTER — Other Ambulatory Visit: Payer: Self-pay | Admitting: Allergy & Immunology

## 2017-04-25 ENCOUNTER — Other Ambulatory Visit: Payer: Self-pay | Admitting: Allergy & Immunology

## 2017-04-26 ENCOUNTER — Other Ambulatory Visit: Payer: Self-pay | Admitting: Allergy & Immunology

## 2017-05-03 ENCOUNTER — Other Ambulatory Visit: Payer: Self-pay | Admitting: Allergy

## 2017-05-25 ENCOUNTER — Ambulatory Visit (INDEPENDENT_AMBULATORY_CARE_PROVIDER_SITE_OTHER): Payer: BLUE CROSS/BLUE SHIELD | Admitting: Allergy & Immunology

## 2017-05-25 ENCOUNTER — Encounter: Payer: Self-pay | Admitting: Allergy & Immunology

## 2017-05-25 ENCOUNTER — Ambulatory Visit: Payer: BLUE CROSS/BLUE SHIELD | Admitting: Allergy & Immunology

## 2017-05-25 VITALS — BP 92/64 | HR 98 | Temp 97.9°F | Resp 16 | Ht 68.0 in

## 2017-05-25 DIAGNOSIS — J3089 Other allergic rhinitis: Secondary | ICD-10-CM | POA: Diagnosis not present

## 2017-05-25 DIAGNOSIS — J4530 Mild persistent asthma, uncomplicated: Secondary | ICD-10-CM

## 2017-05-25 DIAGNOSIS — K219 Gastro-esophageal reflux disease without esophagitis: Secondary | ICD-10-CM | POA: Diagnosis not present

## 2017-05-25 DIAGNOSIS — J302 Other seasonal allergic rhinitis: Secondary | ICD-10-CM

## 2017-05-25 MED ORDER — BUDESONIDE 180 MCG/ACT IN AEPB: 2.0000 | INHALATION_SPRAY | Freq: Two times a day (BID) | RESPIRATORY_TRACT | 3 refills | Status: DC

## 2017-05-25 MED ORDER — CETIRIZINE HCL 10 MG PO TABS: ORAL_TABLET | ORAL | 0 refills | Status: DC

## 2017-05-25 MED ORDER — ALBUTEROL SULFATE HFA 108 (90 BASE) MCG/ACT IN AERS: INHALATION_SPRAY | RESPIRATORY_TRACT | 0 refills | Status: DC

## 2017-05-25 NOTE — Patient Instructions (Addendum)
1. Mild persistent asthma, uncomplicated - Spirometry looked stable today. - We will not make any medication changes.  - Daily controller medication(s): Pulmicort 186mcg two puffs twice daily - Rescue medications: ProAir 4 puffs every 4-6 hours as needed - Changes during respiratory infections or worsening symptoms: increase Pulmicort 142mcg to 4 puffs twice daily for TWO WEEKS. - Asthma control goals:  * Full participation in all desired activities (may need albuterol before activity) * Albuterol use two time or less a week on average (not counting use with activity) * Cough interfering with sleep two time or less a month * Oral steroids no more than once a year * No hospitalizations  2. Chronic rhinitis - Continue with nasal saline rinses. - Continue with Nasacort 1-2 sprays per nostril daily as needed. - Continue with Zyrtec (cetirizine) 10mg  over the counter.   3. Return in about 6 months (around 11/25/2017).    Please inform us of any Emergency Department visits, hospitalizations, or changes in symptoms. Call us before going to the ED for breathing or allergy symptoms since we might be able to fit you in for a sick visit. Feel free to contact us anytime with any questions, problems, or concerns.  It was a pleasure to see you again today! Enjoy the rest of your summer!   Websites that have reliable patient information: 1. American Academy of Asthma, Allergy, and Immunology: www.aaaai.org 2. Food Allergy Research and Education (FARE): foodallergy.org 3. Mothers of Asthmatics: http://www.asthmacommunitynetwork.org 4. American College of Allergy, Asthma, and Immunology: www.acaai.org   Election Day is coming up on Tuesday, November 6th! Make your voice heard! Register to vote at vote.org!

## 2017-05-25 NOTE — Progress Notes (Signed)
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 Mild persistent asthma - complicated by stable restrictive lung disease - Spirometry looked stable today. - We will not make any medication changes.  - We did discuss changing to a combined ICS/LABA, however she reports that she has tried these in the past and had no effect. - She prefers to stay on Pulmicort since she has remained so stable with it.  - Daily controller medication(s): Pulmicort 137mcg two puffs twice daily - Rescue medications: ProAir 4 puffs every 4-6 hours as needed - Changes during respiratory infections or worsening symptoms: increase Pulmicort 126mcg to 4 puffs twice daily for TWO WEEKS. - Asthma control goals:  * Full participation in all desired activities (may need albuterol before activity) * Albuterol use two time or less a week on average (not counting use with activity) * Cough interfering with sleep two time or less a month * Oral steroids no more than once a year * No hospitalizations  2. Chronic rhinitis - Continue with nasal saline rinses. - Continue with Nasacort 1-2 sprays per nostril daily as needed. - Continue with Zyrtec (cetirizine) 10mg  over the counter.   3. GERD - Continue Nexium daily  4. Return in about 6 months (around 11/25/2017).   Subjective:   [name] is a 41 y.o. female presenting today for follow up of  Chief Complaint    Patient presents with  . Asthma    [name] has a history of the following: Patient Active Problem List   Diagnosis Date Noted  . 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 Asthma with acute exacerbation 01/23/2016  . Acute sinusitis 01/23/2016  . Allergic rhinitis 01/23/2016  . ILD (interstitial lung disease) ([place]) 07/30/2012  . Dyspnea 07/16/2012    History obtained from: chart review and patient.  [facility] Primary Care Provider is Carol Ada, MD.     [name] is a 41 y.o. female presenting for a follow up visit. She was last seen in September 2017. She has a history of moderate persistent asthma and was having an exacerbation at that visit. I also diagnosed her with acute sinusitis. She has a complicated past medical history including brain cancer status post multiple surgeries and radiation treatments. She has a history of restrictive lung disease with evidence of interstitial fibrosis and granulomatous disease, which was diagnosed in 2013. We provided a prescription for Biaxin 500mg  twice daily for her sinusitis and gave her a steroid injection for her asthma. She had skin testing in 2003 that was positive to pollens, weeds, trees, dust mite, cat, dog, and some molds.  Since the last visit, she has done quite well. She remains on Pulmicort two puffs twice daily and has done quite well with it. Tekila's asthma has been well controlled. She has not required rescue medication, experienced nocturnal awakenings due to lower respiratory symptoms, nor have activities of daily living been limited. She has required no Emergency Department or Urgent Care visits for her asthma. She has  required zero courses of systemic steroids for asthma exacerbations since the last visit. ACT score today is 22, indicating excellent asthma symptom control. She does report some problems with exercise tolerance, but is not willing to try a different medication today. She has been on Symbicort  and Breo in the past, but felt no improved symptoms.  Allergic rhinitis is well controlled with the nasal steroid as well as cetirizine daily. She says that she is very happy with how well she has done since seeing me last year. [name] does have a history of GERD that is controlled with daily Nexium.   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 She gets twice yearly MRI scans, and her last one was in June. Apparently there was a mass that was seen at the base of her brain close to where her tumor was removed in 2014. However, she has remained asymptomatic and her oncologist feels that there is a good chance that this is scar tissue. She is going to have another scan next week to see if there has been any progression.   Delyla no longer follows with pulmonologist. Review of his note from October 2013 shows that he felt that the pulmonary fibrosis noted on the chest CT was actually dependent atelectasis or evidence of microaspiration. Evidently, the changes had remained stable since an abdominal and chest CT performed in 2011. Her last chest CT was in 2014 and was stable.   Asthma history (obtained from paper chart): [name] first saw [place] in June 2003 when she was 41 years old. She had just moved from [place] at the time. She had asthma as a child that seemed to resolve between 54 and 75 years of age. When we first saw her, she was on Serovent one puff BID As well as Pulmicort two puffs BID. She has been on multiple antihistamines and nasal sprays over the years. She was started on allergy immunotherapy in 2003 and had a systemic reaction in June 2003 and February 2004. She tends to miss appointments when she is doing well. Overall her frequency of exacerbations has been around 2-4 per year on  average. She had a particularly severe episode in September 2011 at which time she was diagnosed with pneumonia and transferred to Urgent Care for management, She was referred to Pulmonology in October 2013 (Dr. Chase Caller). A chest CT was consistent with mild chronic interstitial fibrosis and stable bilateral granulomata without opacities. There was no etiology to explain these findings. Restrictive disease started showing up in 2013 on spirometry. Repeat chest CT in 2014 was stable and there does not appear to have been another one since that time.    Otherwise, there have been no changes to her past medical history, surgical history, family history, or social history. She has a daughter who is a senior this year and plans to go to [employer] for Nursing. Her son is in the 7th grade and is active in sports. She had a great summer hanging out with her kids.     Review of Systems: a 14-point review of systems is pertinent for what is mentioned in HPI.  Otherwise, all other systems were negative. Constitutional: negative other than that listed in the HPI Eyes: negative other than that listed in the HPI Ears, nose,  mouth, throat, and face: negative other than that listed in the HPI Respiratory: negative other than that listed in the HPI Cardiovascular: negative other than that listed in the HPI Gastrointestinal: negative other than that listed in the HPI Genitourinary: negative other than that listed in the HPI Integument: negative other than that listed in the HPI Hematologic: negative other than that listed in the HPI Musculoskeletal: negative other than that listed in the HPI Neurological: negative other than that listed in the HPI Allergy/Immunologic: negative other than that listed in the HPI    Objective:   Blood pressure 92/64, pulse 98, temperature 97.9 F (36.6 C), temperature source Oral, resp. rate 16, height 5\' 8"  (1.727 m), SpO2 97 %. There is no height or weight on file to calculate  BMI.   Physical Exam:  General: Alert, interactive, in no acute distress. Pleasant.  Eyes: No conjunctival injection present on the right, No conjunctival injection present on the left, PERRL bilaterally, No discharge on the right, No discharge on the left and No Horner-Trantas dots present Ears: Right TM pearly gray with normal light reflex, Left TM pearly gray with normal light reflex, Right TM intact without perforation and Left TM intact without perforation.  Nose/Throat: External nose within normal limits and septum midline, turbinates edematous and pale with clear discharge, post-pharynx erythematous without cobblestoning in the posterior oropharynx. Tonsils 2+ without exudates Neck: Supple without thyromegaly. Lungs: Clear to auscultation without wheezing, rhonchi or rales. No increased work of breathing. CV: Normal S1/S2, no murmurs. Capillary refill <2 seconds.  Skin: Warm and dry, without lesions or rashes. Neuro:   Grossly intact. No focal deficits appreciated. Responsive to questions.   Diagnostic studies:   Spirometry: results abnormal (FEV1: 2.59/67%, FVC: 2.79/57%, FEV1/FVC: 93%).    Spirometry consistent with possible restrictive disease but levels are improved today compared to her last spirometry.   Allergy Studies: none     Salvatore Marvel, MD [facility]

## 2017-05-26 ENCOUNTER — Other Ambulatory Visit: Payer: Self-pay | Admitting: Allergy & Immunology

## 2017-05-27 ENCOUNTER — Telehealth: Payer: Self-pay | Admitting: Allergy & Immunology

## 2017-05-27 MED ORDER — CETIRIZINE HCL 10 MG PO TABS: ORAL_TABLET | ORAL | 0 refills | Status: DC

## 2017-05-27 MED ORDER — BUDESONIDE 180 MCG/ACT IN AEPB: 2.0000 | INHALATION_SPRAY | Freq: Two times a day (BID) | RESPIRATORY_TRACT | 1 refills | Status: DC

## 2017-05-27 NOTE — Telephone Encounter (Signed)
Pt called and said that the Zytec and Pulmicort 90 day suppy called into [facility] [phone].

## 2017-05-27 NOTE — Telephone Encounter (Signed)
Called patient. I left message asking patient to call the office in regards to medication refills. In the patients charts it stated that we sent scripts for Pulmicort and Zyrtec to the [facility].

## 2017-05-27 NOTE — Telephone Encounter (Signed)
Patient called back and stated the pharmacy stated they did not receive the order. I went back and took another look and it looked the order failed. I resent the medications.

## 2017-05-27 NOTE — Telephone Encounter (Signed)
We sent scripts on 05/25/2017

## 2017-06-02 ENCOUNTER — Telehealth: Payer: Self-pay | Admitting: Allergy & Immunology

## 2017-06-02 MED ORDER — CETIRIZINE HCL 10 MG PO TABS: ORAL_TABLET | ORAL | 1 refills | Status: DC

## 2017-06-02 NOTE — Telephone Encounter (Signed)
rx sent in patient notified.  

## 2017-06-02 NOTE — Telephone Encounter (Signed)
Pt called and needs to [facility] [address] [phone].

## 2017-06-03 ENCOUNTER — Other Ambulatory Visit: Payer: Self-pay

## 2017-06-03 MED ORDER — CETIRIZINE HCL 10 MG PO TABS: ORAL_TABLET | ORAL | 1 refills | Status: DC

## 2017-06-03 NOTE — Telephone Encounter (Signed)
Refilled zyrtec 10 mg 90 day supply x's 1

## 2017-06-17 ENCOUNTER — Emergency Department (HOSPITAL_COMMUNITY)
Admission: EM | Admit: 2017-06-17 | Discharge: 2017-06-17 | Disposition: A | Discharge status: Home / Self Care | Payer: BLUE CROSS/BLUE SHIELD | Attending: Emergency Medicine | Admitting: Emergency Medicine

## 2017-06-17 ENCOUNTER — Encounter (HOSPITAL_COMMUNITY): Payer: Self-pay | Admitting: Emergency Medicine

## 2017-06-17 ENCOUNTER — Emergency Department (HOSPITAL_COMMUNITY): Payer: BLUE CROSS/BLUE SHIELD

## 2017-06-17 DIAGNOSIS — N200 Calculus of kidney: Secondary | ICD-10-CM | POA: Insufficient documentation

## 2017-06-17 DIAGNOSIS — Z79899 Other long term (current) drug therapy: Secondary | ICD-10-CM | POA: Insufficient documentation

## 2017-06-17 DIAGNOSIS — Z85841 Personal history of malignant neoplasm of brain: Secondary | ICD-10-CM | POA: Diagnosis not present

## 2017-06-17 DIAGNOSIS — J45909 Unspecified asthma, uncomplicated: Secondary | ICD-10-CM | POA: Insufficient documentation

## 2017-06-17 DIAGNOSIS — R1032 Left lower quadrant pain: Secondary | ICD-10-CM | POA: Diagnosis present

## 2017-06-17 LAB — URINALYSIS, ROUTINE W REFLEX MICROSCOPIC
Bilirubin Urine: NEGATIVE
Glucose, UA: NEGATIVE mg/dL
KETONES UR: NEGATIVE mg/dL
Nitrite: NEGATIVE
PH: 6 (ref 5.0–8.0)
Protein, ur: NEGATIVE mg/dL
SPECIFIC GRAVITY, URINE: 1.004 — AB (ref 1.005–1.030)

## 2017-06-17 LAB — COMPREHENSIVE METABOLIC PANEL
ALBUMIN: 4.3 g/dL (ref 3.5–5.0)
ALK PHOS: 71 U/L (ref 38–126)
ALT: 20 U/L (ref 14–54)
ANION GAP: 8 (ref 5–15)
AST: 17 U/L (ref 15–41)
BUN: 14 mg/dL (ref 6–20)
CO2: 26 mmol/L (ref 22–32)
Calcium: 9.3 mg/dL (ref 8.9–10.3)
Chloride: 105 mmol/L (ref 101–111)
Creatinine, Ser: 0.69 mg/dL (ref 0.44–1.00)
GFR calc Af Amer: >60 mL/min (ref >60)
GFR calc non Af Amer: >60 mL/min (ref >60)
GLUCOSE: 83 mg/dL (ref 65–99)
POTASSIUM: 3.4 mmol/L — AB (ref 3.5–5.1)
SODIUM: 139 mmol/L (ref 135–145)
Total Bilirubin: 0.6 mg/dL (ref 0.3–1.2)
Total Protein: 7.4 g/dL (ref 6.5–8.1)

## 2017-06-17 LAB — CBC
HEMATOCRIT: 36.1 % (ref 36.0–46.0)
Hemoglobin: 11.9 g/dL — ABNORMAL LOW (ref 12.0–15.0)
MCH: 29.9 pg (ref 26.0–34.0)
MCHC: 33 g/dL (ref 30.0–36.0)
MCV: 90.7 fL (ref 78.0–100.0)
Platelets: 202 10*3/uL (ref 150–400)
RBC: 3.98 MIL/uL (ref 3.87–5.11)
RDW: 13.1 % (ref 11.5–15.5)
WBC: 8.4 10*3/uL (ref 4.0–10.5)

## 2017-06-17 LAB — POC URINE PREG, ED: PREG TEST UR: NEGATIVE

## 2017-06-17 LAB — LIPASE, BLOOD: Lipase: 27 U/L (ref 11–51)

## 2017-06-17 MED ORDER — HYDROMORPHONE HCL 1 MG/ML IJ SOLN
1.0000 mg | Freq: Once | INTRAMUSCULAR | Status: AC
  Administered 2017-06-17: 1 mg via INTRAVENOUS
  Filled 2017-06-17: qty 1

## 2017-06-17 MED ORDER — TAMSULOSIN HCL 0.4 MG PO CAPS: 0.4000 mg | ORAL_CAPSULE | Freq: Every day | ORAL | 0 refills | Status: DC

## 2017-06-17 NOTE — ED Triage Notes (Signed)
Pt reports she began to have worsening LLQ abd and flank pain since this afternoon. Hx of kidney stones, feels like same. Tried home medications without relief.

## 2017-06-17 NOTE — ED Notes (Signed)
ED Provider at bedside. 

## 2017-06-17 NOTE — ED Notes (Signed)
Patient transported to CT 

## 2017-06-17 NOTE — Discharge Instructions (Signed)
Please read attached information regarding your condition. Take Flomax every night as directed. Follow-up with urologist listed below for further evaluation. Return to ED for severe worsening abdominal pain, trouble with urination, vomiting, fever.

## 2017-06-17 NOTE — ED Provider Notes (Signed)
[facility] DEPT Provider Note   CSN: [id number] Arrival date & time: 06/17/17  1652     History   Chief Complaint Chief Complaint  Patient presents with  . Abdominal Pain    HPI [name] is a 41 y.o. female.  HPI  Patient, with a past medical history of prior kidney stones, brain tumor, presents to ED for evaluation of 1 day history of left sided flank pain radiating to left lower quadrant and groin. She has had a history of kidney stones in the past on bilateral sides but she states none in the past 8 months. She has had several episodes of vomiting today. She was seen by her urologist several months ago and was told to discontinue Flomax due to being asymptomatic. She is seen at a pain center and takes Nucynta and hydromorphone for pain due to chronic headaches. She denies fever, chills, diarrhea, constipation, changes in appetite.  Past Medical History:  Diagnosis Date  . Anxiety   . Arthritis    arthritis in knees  . Asthma    asthma since a child. Well controlled  . Brain tumor ([place])   . Brain tumor, glioma ([place])   . 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 surgery x4 2015  . Kidney stone   . Migraine   . Pneumonia    last time 2 yr ago    Patient Active Problem List   Diagnosis Date Noted  . 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 Asthma with acute exacerbation 01/23/2016  . Acute sinusitis 01/23/2016  . Allergic rhinitis 01/23/2016  . ILD (interstitial lung disease) ([place]) 07/30/2012  . Dyspnea 07/16/2012    Past Surgical History:  Procedure Laterality Date  . BRAIN SURGERY  2015   Brain surgery x 4 in 2015 at [facility]    . CHOLECYSTECTOMY    . CYSTOSCOPY W/ URETERAL STENT PLACEMENT Right 10/26/2016   Procedure: CYSTOSCOPY WITH RETROGRADE PYELOGRAM/URETERAL STENT PLACEMENT;  Surgeon: [name], MD;  Location: [facility];  Service: Urology;  Laterality: Right;  . CYSTOSCOPY WITH  RETROGRADE PYELOGRAM, URETEROSCOPY AND STENT PLACEMENT Right 10/18/2014   Procedure: CYSTOSCOPY WITH RETROGRADE PYELOGRAM, URETEROSCOPY, STONE EXTRACTION AND STENT PLACEMENT;  Surgeon: Arvil Persons, MD;  Location: [facility];  Service: Urology;  Laterality: Right;  . CYSTOSCOPY WITH RETROGRADE PYELOGRAM, URETEROSCOPY AND STENT PLACEMENT Right 10/29/2016   Procedure: CYSTOSCOPY WITH RETROGRADE PYELOGRAM, URETEROSCOPY AND STENT PLACEMENT;  Surgeon: [name], MD;  Location: [facility];  Service: Urology;  Laterality: Right;  . CYSTOSTOMY W/ STENT INSERTION     numerous times  . HOLMIUM LASER APPLICATION Right 0/81/4481   Procedure: HOLMIUM LASER APPLICATION;  Surgeon: Arvil Persons, MD;  Location: [facility];  Service: Urology;  Laterality: Right;  . HOLMIUM LASER APPLICATION Right 8/56/3149   Procedure: HOLMIUM LASER APPLICATION;  Surgeon: [name], MD;  Location: [facility];  Service: Urology;  Laterality: Right;  . LASER ABLATION OF THE CERVIX      OB History    No data available       Home Medications    Prior to Admission medications   Medication Sig Start Date End Date Taking? Authorizing Provider  albuterol [facility] HFA) 108 (90 Base) MCG/ACT inhaler 4 puffs every 4-6 hours 05/25/17  Yes Valentina Shaggy, MD  budesonide (PULMICORT FLEXHALER) 180 MCG/ACT inhaler Inhale 2 puffs into the lungs 2 (two) times daily. 05/27/17  Yes  Valentina Shaggy, MD  budesonide (RHINOCORT AQUA) 32 MCG/ACT nasal spray Place 1 spray into both nostrils daily. Patient taking differently: Place 1 spray into both nostrils every evening.  07/30/15  Yes Gean Quint, MD  cetirizine (ZYRTEC) 10 MG tablet TAKE ONE TABLET ONCE DAILY FOR RUNNY NOSE OR ITCHING. 06/03/17  Yes Valentina Shaggy, MD  Cranberry 500 MG CAPS Take 1 capsule by mouth every evening.   Yes [provider]  Eszopiclone 3 MG TABS Take 3 mg by mouth at bedtime. Reported on 01/23/2016   Yes [provider]  fish  oil-omega-3 fatty acids 1000 MG capsule Take 1 g by mouth at bedtime. Reported on 01/23/2016   Yes [provider]  HYDROmorphone (DILAUDID) 4 MG tablet Take 1-3 tablets every 6 hours as needed for pain 11/07/16  Yes Lolita Rieger, MD  loperamide (IMODIUM A-D) 2 MG capsule Take 2 mg by mouth as needed for diarrhea or loose stools.   Yes [provider]  NUCYNTA ER 150 MG TB12 Take 150 mg by mouth every 12 (twelve) hours. 09/30/16  Yes [provider]  omeprazole (PRILOSEC OTC) 20 MG tablet Take 20 mg by mouth at bedtime.   Yes [provider]  ondansetron (ZOFRAN) 8 MG tablet Take 8 mg by mouth every 8 (eight) hours as needed for nausea or vomiting.   Yes [provider]  tamsulosin (FLOMAX) 0.4 MG CAPS capsule Take 1 capsule (0.4 mg total) by mouth daily after supper. 06/17/17   Nalani Andreen, PA-C  XOPENEX HFA 45 MCG/ACT inhaler USE 1 TO 2 INHALATIONS EVERY 4 HOURS AS NEEDED FOR WHEEZING 04/01/16   Kozlow, Donnamarie Poag, MD    Family History Family History  Problem Relation Age of Onset  . Allergies Father   . Heart disease Mother   . Clotting disorder Mother   . Heart disease Maternal Grandmother   . Heart disease Paternal Grandmother   . Cancer Unknown        grandparents  . Urticaria Neg Hx   . Immunodeficiency Neg Hx   . Eczema Neg Hx   . Atopy Neg Hx   . Asthma Neg Hx   . Angioedema Neg Hx   . Allergic rhinitis Neg Hx     Social History Social History  Substance Use Topics  . Smoking status: Never Smoker  . Smokeless tobacco: Never Used  . Alcohol use No     Allergies   Penicillins; Albuterol; Codeine; Compazine [prochlorperazine edisylate]; Midazolam; and Betasept surgical scrub [chlorhexidine gluconate]   Review of Systems Review of Systems  Constitutional: Negative for appetite change, chills and fever.  HENT: Negative for ear pain, rhinorrhea, sneezing and sore throat.   Eyes: Negative for photophobia and visual disturbance.    Respiratory: Negative for cough, chest tightness, shortness of breath and wheezing.   Cardiovascular: Negative for chest pain and palpitations.  Gastrointestinal: Positive for abdominal pain, nausea and vomiting. Negative for blood in stool, constipation and diarrhea.  Genitourinary: Positive for flank pain, frequency and urgency. Negative for dysuria, hematuria, vaginal bleeding and vaginal discharge.  Musculoskeletal: Negative for myalgias.  Skin: Negative for rash.  Neurological: Negative for dizziness, weakness and light-headedness.     Physical Exam Updated Vital Signs BP 130/80 (BP Location: Right Arm)   Pulse 88   Temp 97.8 F (36.6 C) (Oral)   Resp 20   SpO2 100%   Physical Exam  Constitutional: She appears well-developed and well-nourished. No distress.  HENT:  Head: Normocephalic and atraumatic.  Nose: Nose normal.  Eyes: Conjunctivae and EOM are normal. Left eye exhibits no discharge. No scleral icterus.  Neck: Normal range of motion. Neck supple.  Cardiovascular: Normal rate, regular rhythm, normal heart sounds and intact distal pulses.  Exam reveals no gallop and no friction rub.   No murmur heard. Pulmonary/Chest: Effort normal and breath sounds normal. No respiratory distress.  Abdominal: Soft. Bowel sounds are normal. She exhibits no distension. There is tenderness. There is no guarding.    Left-sided CVA tenderness, left lower quadrant and groin tenderness.  Musculoskeletal: Normal range of motion. She exhibits no edema.  Neurological: She is alert. She exhibits normal muscle tone. Coordination normal.  Skin: Skin is warm and dry. No rash noted.  Psychiatric: She has a normal mood and affect.  Nursing note and vitals reviewed.    ED Treatments / Results  Labs (all labs ordered are listed, but only abnormal results are displayed) Labs Reviewed  COMPREHENSIVE METABOLIC PANEL - Abnormal; Notable for the following:       Result Value   Potassium 3.4 (*)     All other components within normal limits  CBC - Abnormal; Notable for the following:    Hemoglobin 11.9 (*)    All other components within normal limits  URINALYSIS, ROUTINE W REFLEX MICROSCOPIC - Abnormal; Notable for the following:    Color, Urine STRAW (*)    APPearance HAZY (*)    Specific Gravity, Urine 1.004 (*)    Hgb urine dipstick MODERATE (*)    Leukocytes, UA SMALL (*)    Bacteria, UA FEW (*)    Squamous Epithelial / LPF 6-30 (*)    All other components within normal limits  LIPASE, BLOOD  POC URINE PREG, ED    EKG  EKG Interpretation None       Radiology No results found.  Procedures Procedures (including critical care time)  Medications Ordered in ED Medications  HYDROmorphone (DILAUDID) injection 1 mg (1 mg Intravenous Given 06/17/17 1909)     Initial Impression / Assessment and Plan / ED Course  I have reviewed the triage vital signs and the nursing notes.  Pertinent labs & imaging results that were available during my care of the patient were reviewed by me and considered in my medical decision making (see chart for details).     Patient presents to ED for evaluation of left-sided flank pain radiating to left lower quadrant and groin. She does have a history of prior kidney stones and states this feels similar but she has not had a kidney stone about 8 months. She states that while she was in triage she felt one of the kidney stones pass but is still having some pain. Physical exam she does appear uncomfortable with left abdominal and groin pain. Urinalysis with blood present. Lab work including CBC, CMP, lipase unremarkable. Urine pregnancy negative. I did order a CT scan but patient stated that while she was in the room she felt like her other kidney stones pass 2 after using the bathroom. She declined a CT at this time. She states that she feels that all of her stones have passed. She is unsure if there are still any remaining stones. We'll give Flomax  and advised patient to follow-up with urology for further evaluation. Patient declines any other imaging or lab work at this time. Patient appears stable for discharge at this time. Strict return precautions given.  Final Clinical Impressions(s) / ED Diagnoses  Final diagnoses:  Nephrolithiasis    New Prescriptions Discharge Medication List as of 06/17/2017  8:29 PM    START taking these medications   Details  tamsulosin (FLOMAX) 0.4 MG CAPS capsule Take 1 capsule (0.4 mg total) by mouth daily after supper., Starting Fri 06/17/2017, Print         [place], [place], PA-C 06/17/17 2356    Daleen Bo, MD 06/19/17 [phone]

## 2017-10-10 ENCOUNTER — Other Ambulatory Visit: Payer: Self-pay | Admitting: *Deleted

## 2017-10-10 MED ORDER — BUDESONIDE 180 MCG/ACT IN AEPB: 2.0000 | INHALATION_SPRAY | Freq: Two times a day (BID) | RESPIRATORY_TRACT | 0 refills | Status: DC

## 2017-11-03 DIAGNOSIS — R1110 Vomiting, unspecified: Secondary | ICD-10-CM | POA: Insufficient documentation

## 2017-11-03 DIAGNOSIS — T451X5A Adverse effect of antineoplastic and immunosuppressive drugs, initial encounter: Secondary | ICD-10-CM

## 2018-01-26 DIAGNOSIS — R5382 Chronic fatigue, unspecified: Secondary | ICD-10-CM | POA: Insufficient documentation

## 2018-02-10 ENCOUNTER — Other Ambulatory Visit: Payer: Self-pay | Admitting: Allergy & Immunology

## 2018-03-11 ENCOUNTER — Emergency Department (HOSPITAL_COMMUNITY): Payer: BLUE CROSS/BLUE SHIELD

## 2018-03-11 ENCOUNTER — Emergency Department (HOSPITAL_COMMUNITY)
Admission: EM | Admit: 2018-03-11 | Discharge: 2018-03-11 | Disposition: A | Discharge status: Home / Self Care | Payer: BLUE CROSS/BLUE SHIELD | Attending: Emergency Medicine | Admitting: Emergency Medicine

## 2018-03-11 ENCOUNTER — Encounter (HOSPITAL_COMMUNITY): Payer: Self-pay | Admitting: Emergency Medicine

## 2018-03-11 DIAGNOSIS — R339 Retention of urine, unspecified: Secondary | ICD-10-CM | POA: Diagnosis present

## 2018-03-11 DIAGNOSIS — R531 Weakness: Secondary | ICD-10-CM | POA: Diagnosis not present

## 2018-03-11 DIAGNOSIS — K5903 Drug induced constipation: Secondary | ICD-10-CM | POA: Insufficient documentation

## 2018-03-11 DIAGNOSIS — R7401 Elevation of levels of liver transaminase levels: Secondary | ICD-10-CM

## 2018-03-11 DIAGNOSIS — J45909 Unspecified asthma, uncomplicated: Secondary | ICD-10-CM | POA: Insufficient documentation

## 2018-03-11 DIAGNOSIS — Z79899 Other long term (current) drug therapy: Secondary | ICD-10-CM | POA: Insufficient documentation

## 2018-03-11 DIAGNOSIS — R740 Nonspecific elevation of levels of transaminase and lactic acid dehydrogenase [LDH]: Secondary | ICD-10-CM | POA: Insufficient documentation

## 2018-03-11 DIAGNOSIS — N390 Urinary tract infection, site not specified: Secondary | ICD-10-CM | POA: Diagnosis not present

## 2018-03-11 LAB — COMPREHENSIVE METABOLIC PANEL
ALT: 775 U/L — AB (ref 14–54)
AST: 1226 U/L — AB (ref 15–41)
Albumin: 4.2 g/dL (ref 3.5–5.0)
Alkaline Phosphatase: 117 U/L (ref 38–126)
Anion gap: 8 (ref 5–15)
BUN: 14 mg/dL (ref 6–20)
CO2: 26 mmol/L (ref 22–32)
CREATININE: 0.6 mg/dL (ref 0.44–1.00)
Calcium: 9.1 mg/dL (ref 8.9–10.3)
Chloride: 107 mmol/L (ref 101–111)
GFR calc non Af Amer: >60 mL/min (ref >60)
Glucose, Bld: 105 mg/dL — ABNORMAL HIGH (ref 65–99)
Potassium: 4 mmol/L (ref 3.5–5.1)
SODIUM: 141 mmol/L (ref 135–145)
Total Bilirubin: 2 mg/dL — ABNORMAL HIGH (ref 0.3–1.2)
Total Protein: 7.3 g/dL (ref 6.5–8.1)

## 2018-03-11 LAB — CBC WITH DIFFERENTIAL/PLATELET
Basophils Absolute: 0 10*3/uL (ref 0.0–0.1)
Basophils Relative: 0 %
EOS ABS: 0 10*3/uL (ref 0.0–0.7)
Eosinophils Relative: 0 %
HEMATOCRIT: 36.1 % (ref 36.0–46.0)
HEMOGLOBIN: 12.1 g/dL (ref 12.0–15.0)
LYMPHS ABS: 0.8 10*3/uL (ref 0.7–4.0)
Lymphocytes Relative: 14 %
MCH: 31 pg (ref 26.0–34.0)
MCHC: 33.5 g/dL (ref 30.0–36.0)
MCV: 92.6 fL (ref 78.0–100.0)
Monocytes Absolute: 0.6 10*3/uL (ref 0.1–1.0)
Monocytes Relative: 10 %
NEUTROS ABS: 4.5 10*3/uL (ref 1.7–7.7)
NEUTROS PCT: 76 %
Platelets: 183 10*3/uL (ref 150–400)
RBC: 3.9 MIL/uL (ref 3.87–5.11)
RDW: 12.8 % (ref 11.5–15.5)
WBC: 5.9 10*3/uL (ref 4.0–10.5)

## 2018-03-11 LAB — URINALYSIS, ROUTINE W REFLEX MICROSCOPIC
Bilirubin Urine: NEGATIVE
Glucose, UA: NEGATIVE mg/dL
KETONES UR: NEGATIVE mg/dL
Nitrite: POSITIVE — AB
PROTEIN: NEGATIVE mg/dL
Specific Gravity, Urine: 1.01 (ref 1.005–1.030)
pH: 6 (ref 5.0–8.0)

## 2018-03-11 LAB — POC URINE PREG, ED: Preg Test, Ur: NEGATIVE

## 2018-03-11 MED ORDER — SODIUM CHLORIDE 0.9 % IV BOLUS
1000.0000 mL | Freq: Once | INTRAVENOUS | Status: AC
  Administered 2018-03-11: 1000 mL via INTRAVENOUS

## 2018-03-11 MED ORDER — HYDROMORPHONE HCL 1 MG/ML IJ SOLN
1.0000 mg | Freq: Once | INTRAMUSCULAR | Status: AC
Start: 2018-03-11 — End: 2018-03-11
  Administered 2018-03-11: 1 mg via INTRAVENOUS
  Filled 2018-03-11: qty 1

## 2018-03-11 MED ORDER — METHYLNALTREXONE BROMIDE 12 MG/0.6ML ~~LOC~~ SOLN
12.0000 mg | Freq: Once | SUBCUTANEOUS | Status: DC
  Filled 2018-03-11: qty 0.6

## 2018-03-11 MED ORDER — AZITHROMYCIN 250 MG PO TABS
500.0000 mg | ORAL_TABLET | Freq: Once | ORAL | Status: AC
  Administered 2018-03-11: 500 mg via ORAL
  Filled 2018-03-11: qty 2

## 2018-03-11 MED ORDER — HYDROMORPHONE HCL 1 MG/ML IJ SOLN
1.0000 mg | Freq: Once | INTRAMUSCULAR | Status: AC
  Administered 2018-03-11: 1 mg via INTRAVENOUS
  Filled 2018-03-11: qty 1

## 2018-03-11 MED ORDER — ONDANSETRON HCL 4 MG/2ML IJ SOLN
4.0000 mg | Freq: Once | INTRAMUSCULAR | Status: AC
  Administered 2018-03-11: 4 mg via INTRAVENOUS
  Filled 2018-03-11: qty 2

## 2018-03-11 MED ORDER — TAPENTADOL HCL ER 100 MG PO TB12
100.0000 mg | ORAL_TABLET | Freq: Two times a day (BID) | ORAL | Status: DC
  Administered 2018-03-11: 100 mg via ORAL
  Filled 2018-03-11 (×2): qty 1

## 2018-03-11 MED ORDER — AZITHROMYCIN 250 MG PO TABS: 250.0000 mg | ORAL_TABLET | Freq: Every day | ORAL | 0 refills | Status: DC

## 2018-03-11 MED ORDER — SODIUM CHLORIDE 0.9 % IV SOLN
1.0000 g | Freq: Once | INTRAVENOUS | Status: AC
  Administered 2018-03-11: 1 g via INTRAVENOUS
  Filled 2018-03-11: qty 10

## 2018-03-11 MED ORDER — SORBITOL 70 % SOLN
960.0000 mL | TOPICAL_OIL | Freq: Once | ORAL | Status: AC
  Administered 2018-03-11: 960 mL via RECTAL
  Filled 2018-03-11: qty 473

## 2018-03-11 MED ORDER — METHYLPHENIDATE HCL 5 MG PO TABS
5.0000 mg | ORAL_TABLET | Freq: Two times a day (BID) | ORAL | Status: DC
  Administered 2018-03-11: 5 mg via ORAL
  Filled 2018-03-11: qty 1

## 2018-03-11 MED ORDER — CEPHALEXIN 500 MG PO CAPS: 500.0000 mg | ORAL_CAPSULE | Freq: Four times a day (QID) | ORAL | 0 refills | Status: DC

## 2018-03-11 NOTE — Discharge Instructions (Addendum)
Your liver enzymes were elevated today.  Please contact your Oncologist for close follow up.  Get rechecked immediately if you develop fevers, severe, pain, yellowing of the skin/eyes, or new concerning symptoms.

## 2018-03-11 NOTE — ED Notes (Signed)
Patient given ginger ale. 

## 2018-03-11 NOTE — ED Triage Notes (Signed)
Pt c/o constipation after chemo. Patient reports that she had little BM on Wed.  Taking laxatives at home: suppositories, Miralax, Mag Citrate with no relief.  Having lots of abd and flank pains.  Pt also having weakness due to chemo and believes that she having another kidney stone due her hx of having them. Having bilat lower extremity weakness and needing help with ambulating due to weakness.

## 2018-03-11 NOTE — ED Notes (Signed)
Patient assisted to restroom using wheelchair. 

## 2018-03-11 NOTE — ED Notes (Signed)
ED Provider at bedside. 

## 2018-03-11 NOTE — ED Provider Notes (Signed)
[facility] DEPT Provider Note   CSN: [id number] Arrival date & time: 03/11/18  1025     History   Chief Complaint Chief Complaint  Patient presents with  . Constipation  . Urinary Retention    HPI [name] is a 42 y.o. female.  The history is provided by the patient. No language interpreter was used.  Constipation      [name] is a 42 y.o. female who presents to the Emergency Department complaining of the patient in urinary retention. 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 She received her last chemo treatment on Monday and since then she has had severe nausea and vomiting. On Thursday she developed bloating and followed by constipation yesterday. She feels like she needs to have a bowel movement and is unable to. She also endorses three days of decreased urinary output unable to urinate since 1 o'clock this morning. She denies any fevers. She is experienced progressive weakness over the last several months. She endorses feeling very fatigued. She was able to urinate after ED arrival.  Past Medical History:  Diagnosis Date  . Anxiety   . Arthritis    arthritis in knees  . Asthma    asthma since a child. Well controlled  . Brain tumor ([place])   . 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 surgery x4 2015  . Kidney stone   . Migraine   . Pneumonia    last time 2 yr ago    Patient Active Problem List   Diagnosis Date Noted  . 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 Asthma with acute exacerbation 01/23/2016  . Acute sinusitis 01/23/2016  . Allergic rhinitis 01/23/2016  . ILD (interstitial lung disease) ([place]) 07/30/2012  . Dyspnea 07/16/2012    Past Surgical History:  Procedure Laterality Date  . BRAIN SURGERY  2015   Brain surgery x 4 in 2015 at [facility]    . CHOLECYSTECTOMY    .  CYSTOSCOPY W/ URETERAL STENT PLACEMENT Right 10/26/2016   Procedure: CYSTOSCOPY WITH RETROGRADE PYELOGRAM/URETERAL STENT PLACEMENT;  Surgeon: [name], MD;  Location: [facility];  Service: Urology;  Laterality: Right;  . CYSTOSCOPY WITH RETROGRADE PYELOGRAM, URETEROSCOPY AND STENT PLACEMENT Right 10/18/2014   Procedure: CYSTOSCOPY WITH RETROGRADE PYELOGRAM, URETEROSCOPY, STONE EXTRACTION AND STENT PLACEMENT;  Surgeon: Arvil Persons, MD;  Location: [facility];  Service: Urology;  Laterality: Right;  . CYSTOSCOPY WITH RETROGRADE PYELOGRAM, URETEROSCOPY AND STENT PLACEMENT Right 10/29/2016   Procedure: CYSTOSCOPY WITH RETROGRADE PYELOGRAM, URETEROSCOPY AND STENT PLACEMENT;  Surgeon: [name], MD;  Location: [facility];  Service: Urology;  Laterality: Right;  . CYSTOSTOMY W/ STENT INSERTION     numerous times  . HOLMIUM LASER APPLICATION Right 02/19/8415   Procedure: HOLMIUM LASER APPLICATION;  Surgeon: Arvil Persons, MD;  Location: [facility];  Service: Urology;  Laterality: Right;  . HOLMIUM LASER APPLICATION Right 03/10/3015   Procedure: HOLMIUM LASER APPLICATION;  Surgeon: [name], MD;  Location: [facility];  Service: Urology;  Laterality: Right;  . LASER ABLATION OF THE CERVIX       OB History   None      Home Medications    Prior to Admission medications   Medication Sig Start Date End Date Taking? Authorizing Provider  budesonide (PULMICORT FLEXHALER) 180 MCG/ACT inhaler Inhale 2  puffs into the lungs 2 (two) times daily. 10/10/17  Yes Valentina Shaggy, MD  budesonide (RHINOCORT AQUA) 32 MCG/ACT nasal spray Place 1 spray into both nostrils daily. Patient taking differently: Place 1 spray into both nostrils every evening.  07/30/15  Yes Gean Quint, MD  cetirizine (ZYRTEC) 10 MG tablet TAKE ONE TABLET ONCE DAILY FOR RUNNY NOSE OR ITCHING. 02/10/18  Yes Valentina Shaggy, MD  Cranberry 500 MG CAPS Take 1 capsule by mouth every evening.   Yes [provider]    docusate sodium (COLACE) 100 MG capsule Take 100 mg by mouth at bedtime.   Yes [provider]  Eszopiclone 3 MG TABS Take 3 mg by mouth at bedtime. Reported on 01/23/2016   Yes [provider]  fish oil-omega-3 fatty acids 1000 MG capsule Take 1 g by mouth at bedtime. Reported on 01/23/2016   Yes [provider]  glycerin adult 2 g suppository Place 1 suppository rectally as needed for constipation.   Yes [provider]  haloperidol (HALDOL) 5 MG tablet Take 5 mg by mouth every 6 (six) hours. 12/08/17  Yes [provider]  HYDROmorphone (DILAUDID) 4 MG tablet Take 1-3 tablets every 6 hours as needed for pain 11/07/16  Yes Lolita Rieger, MD  loperamide (IMODIUM A-D) 2 MG capsule Take 2 mg by mouth as needed for diarrhea or loose stools.   Yes [provider]  magnesium citrate SOLN Take 1 Bottle by mouth once.   Yes [provider]  methylphenidate (RITALIN) 5 MG tablet Take 5 mg by mouth 2 (two) times daily.   Yes [provider]  OLANZapine (ZYPREXA) 10 MG tablet Take 10 mg by mouth at bedtime. 03/06/18  Yes [provider]  omeprazole (PRILOSEC OTC) 20 MG tablet Take 20 mg by mouth at bedtime.   Yes [provider]  ondansetron (ZOFRAN) 8 MG tablet Take 8 mg by mouth 2 (two) times daily.    Yes [provider]  polyethylene glycol (MIRALAX / GLYCOLAX) packet Take 17 g by mouth daily as needed for mild constipation.   Yes [provider]  promethazine (PHENERGAN) 25 MG tablet Take 25 mg by mouth every 6 (six) hours as needed for nausea or vomiting.   Yes [provider]  tapentadol (NUCYNTA ER) 100 MG 12 hr tablet Take 100 mg by mouth every 12 (twelve) hours. 0600 and 1800   Yes [provider]  temazepam (RESTORIL) 15 MG capsule Take 15 mg by mouth at bedtime as needed. 07/29/16  Yes [provider]  XOPENEX HFA 45 MCG/ACT inhaler USE 1 TO 2 INHALATIONS EVERY 4 HOURS AS  NEEDED FOR WHEEZING 04/01/16  Yes Kozlow, Donnamarie Poag, MD  albuterol [facility] HFA) 108 [phone] Base) MCG/ACT inhaler 4 puffs every 4-6 hours Patient not taking: Reported on 03/11/2018 05/25/17   Valentina Shaggy, MD  azithromycin (ZITHROMAX) 250 MG tablet Take 1 tablet (250 mg total) by mouth daily. 03/12/18   Quintella Reichert, MD  cephALEXin (KEFLEX) 500 MG capsule Take 1 capsule (500 mg total) by mouth 4 (four) times daily. 03/11/18   Quintella Reichert, MD  tamsulosin (FLOMAX) 0.4 MG CAPS capsule Take 1 capsule (0.4 mg total) by mouth daily after supper. Patient not taking: Reported on 03/11/2018 06/17/17   Delia Heady, PA-C    Family History Family History  Problem Relation Age of Onset  . Allergies Father   . Heart disease Mother   . Clotting disorder Mother   .  Heart disease Maternal Grandmother   . Heart disease Paternal Grandmother   . Cancer Unknown        grandparents  . Urticaria Neg Hx   . Immunodeficiency Neg Hx   . Eczema Neg Hx   . Atopy Neg Hx   . Asthma Neg Hx   . Angioedema Neg Hx   . Allergic rhinitis Neg Hx     Social History Social History   Tobacco Use  . Smoking status: Never Smoker  . Smokeless tobacco: Never Used  Substance Use Topics  . Alcohol use: No    Alcohol/week: 0.0 oz  . Drug use: No     Allergies   Penicillins; Albuterol; Codeine; Compazine [prochlorperazine edisylate]; Midazolam; and Betasept surgical scrub [chlorhexidine gluconate]   Review of Systems Review of Systems  Gastrointestinal: Positive for constipation.  All other systems reviewed and are negative.    Physical Exam Updated Vital Signs BP 122/78   Pulse (!) 108   Temp 98.3 F (36.8 C) (Oral)   Resp 12   Ht 5\' 9"  (1.753 m)   Wt 77.1 kg (170 lb)   SpO2 97%   BMI 25.10 kg/m   Physical Exam  Constitutional: She is oriented to person, place, and time. She appears well-developed and well-nourished.  HENT:  Head: Normocephalic and atraumatic.  Cardiovascular: Normal rate and  regular rhythm.  No murmur heard. Pulmonary/Chest: Effort normal and breath sounds normal. No respiratory distress.  Abdominal: Soft. There is no rebound and no guarding.  Mild abdominal distension and generalized tenderness  Musculoskeletal: She exhibits no edema or tenderness.  Neurological: She is alert and oriented to person, place, and time.  Mildly dysarthric speech.  3/5 strength in LLE.  5/5 strength in BUE, RLE.   Skin: Skin is warm and dry.  Psychiatric: She has a normal mood and affect. Her behavior is normal.  Nursing note and vitals reviewed.    ED Treatments / Results  Labs (all labs ordered are listed, but only abnormal results are displayed) Labs Reviewed  COMPREHENSIVE METABOLIC PANEL - Abnormal; Notable for the following components:      Result Value   Glucose, Bld 105 (*)    AST 1,226 (*)    ALT 775 (*)    Total Bilirubin 2.0 (*)    All other components within normal limits  URINALYSIS, ROUTINE W REFLEX MICROSCOPIC - Abnormal; Notable for the following components:   APPearance CLOUDY (*)    Hgb urine dipstick SMALL (*)    Nitrite POSITIVE (*)    Leukocytes, UA LARGE (*)    WBC, UA >50 (*)    Bacteria, UA RARE (*)    All other components within normal limits  URINE CULTURE  CBC WITH DIFFERENTIAL/PLATELET  POC URINE PREG, ED    EKG None  Radiology Ct Head Wo Contrast  Result Date: 03/11/2018 CLINICAL DATA:  42 year old female with history of treated brainstem astrocytoma in 2015. Generalized weakness. EXAM: CT HEAD WITHOUT CONTRAST TECHNIQUE: Contiguous axial images were obtained from the base of the skull through the vertex without intravenous contrast. COMPARISON:  Postoperative brain MRI 12/01/2013. Pretreatment brain MRI 10/01/2013. FINDINGS: Brain: Right frontal approach ventricular catheter intersects the right lateral ventricle near the frontal horn (coronal image 38), and also the 3rd ventricle in the midline (coronal image 40), terminating at the  margin of the posterior left suprasellar cistern. No lateral or 3rd ventriculomegaly. The 4th ventricle is prominent. The brainstem remains mildly expanded, similar to the 2015 MRI  appearance (sagittal image 40). There is mild-to-moderate hypodensity in the pons. The cerebellum appears within normal limits. The cerebral hemispheres appear normal by noncontrast CT. No midline shift, intracranial hemorrhage or evidence of cortically based acute infarction. Vascular: No suspicious intracranial vascular hyperdensity. Skull: Sequelae of suboccipital craniectomy. Right frontal burr hole for ventricular catheter. No acute osseous abnormality identified. Sinuses/Orbits: Visualized paranasal sinuses and mastoids are clear. Other: Visualized orbit soft tissues are within normal limits. Right convexity shunt reservoir with tubing tracking to the posterior right neck. No adverse features identified. No acute scalp soft tissue findings. IMPRESSION: 1. Enlargement of the brainstem appears stable since the 2015 brain MRI. Hypodensity in the pons could be related to treatment or residual tumor. 2. No new intracranial abnormality. 3. Right frontal approach ventriculostomy with no adverse features. Prior suboccipital craniectomy. Electronically Signed   By: Genevie Ann M.D.   On: 03/11/2018 14:17   Ct Renal Stone Study  Result Date: 03/11/2018 CLINICAL DATA:  Flank pain. EXAM: CT ABDOMEN AND PELVIS WITHOUT CONTRAST TECHNIQUE: Multidetector CT imaging of the abdomen and pelvis was performed following the standard protocol without oral or IV contrast. COMPARISON:  October 26, 2016 FINDINGS: Lower chest: There is patchy airspace consolidation in portions of the inferior lingula and left lower lobe. There is atelectatic change in the posterior right base. Hepatobiliary: No focal liver lesions are apparent on this noncontrast enhanced study beyond a tiny calcified granuloma in the anterior segment right lobe. Gallbladder is absent. There  is no appreciable biliary duct dilatation. Pancreas: No pancreatic mass or inflammatory focus. Spleen: There are occasional tiny splenic granulomas. Spleen otherwise appears unremarkable. Adrenals/Urinary Tract: Adrenals appear normal bilaterally. No evident renal mass or hydronephrosis on either side. There is no appreciable renal or ureteral calculus on either side. There are scattered 1-2 mm calculi in both kidneys. There is a 3 mm calculus in mid left kidney with an adjacent 2 mm calculus. No ureteral calculi are identified. There is a catheter within the urinary bladder. Air within the urinary bladder is likely due to instrumentation. A small amount of urine remains in the urinary bladder currently. Stomach/Bowel: The rectum is distended with stool. There is no appreciable bowel wall or mesenteric thickening. No evident bowel obstruction. No free air or portal venous air. Vascular/Lymphatic: No abdominal aortic aneurysm. No vascular lesions are evident on this noncontrast enhanced study. No adenopathy is appreciable in the abdomen or pelvis. Reproductive: Uterus is in the midline. There is no evident pelvic mass. There is a small amount of fluid immediately adjacent to the left ovary, likely due to recent ovarian cyst leakage. Other: Ventriculoperitoneal shunt catheter enters the abdomen just to the right of midline anteriorly at the level of the upper pole the kidneys. The catheter tracks to the right and back to the left with the tip of the catheter in the anterior aspect of the lower pelvis just right of midline. There is no fluid surrounding the shunt catheter. Appendix appears normal. No abscess or ascites evident in the abdomen or pelvis. Musculoskeletal: There are no blastic or lytic bone lesions. No intramuscular or abdominal wall lesion evident. IMPRESSION: 1. Foci of airspace consolidation consistent with pneumonia in portions of the inferior lingula and left lower lobe. 2. Multiple small calculi in  each kidney. No hydronephrosis or ureteral calculus on either side. Urinary bladder is decompressed with Foley catheter. Air in the urinary bladder is likely due to recent instrumentation. 3. Rectum mildly distended with stool. No bowel wall  thickening or bowel obstruction evident. No abscess. Appendix appears normal. 4. Shunt catheter tip in the anterior lower right pelvis. No surrounding fluid or inflammatory type change. 5. Small amount of fluid immediately adjacent to left ovary. Suspect recent ovarian cyst leakage. No fluid in the cul-de-sac. 6.  Scattered liver and splenic granulomas. 7.  Gallbladder absent. Electronically Signed   By: Lowella Grip III M.D.   On: 03/11/2018 14:13    Procedures Fecal disimpaction Date/Time: 03/11/2018 6:04 PM Performed by: Quintella Reichert, MD Authorized by: Quintella Reichert, MD  Consent: Verbal consent obtained. Consent given by: patient Patient understanding: patient states understanding of the procedure being performed Local anesthesia used: no  Anesthesia: Local anesthesia used: no  Sedation: Patient sedated: no  Patient tolerance: Patient tolerated the procedure well with no immediate complications    (including critical care time)  Medications Ordered in ED Medications  azithromycin (ZITHROMAX) tablet 500 mg (has no administration in time range)  methylnaltrexone (RELISTOR) injection 12 mg (has no administration in time range)  sorbitol, milk of mag, mineral oil, glycerin (SMOG) enema (has no administration in time range)  methylphenidate (RITALIN) tablet 5 mg (has no administration in time range)  tapentadol (NUCYNTA) 12 hr tablet 100 mg (has no administration in time range)  sodium chloride 0.9 % bolus 1,000 mL (0 mLs Intravenous Stopped 03/11/18 1452)  HYDROmorphone (DILAUDID) injection 1 mg (1 mg Intravenous Given 03/11/18 1312)  ondansetron (ZOFRAN) injection 4 mg (4 mg Intravenous Given 03/11/18 1312)  cefTRIAXone (ROCEPHIN) 1 g in  sodium chloride 0.9 % 100 mL IVPB (0 g Intravenous Stopped 03/11/18 1627)  HYDROmorphone (DILAUDID) injection 1 mg (1 mg Intravenous Given 03/11/18 1616)     Initial Impression / Assessment and Plan / ED Course  I have reviewed the triage vital signs and the nursing notes.  Pertinent labs & imaging results that were available during my care of the patient were reviewed by me and considered in my medical decision making (see chart for details).     Patient with history of astrocytoma on chemotherapy here for blood-tinged urine and difficulty with urination as well as constipation. She did have urinary retention on ED arrival with 500 mL in her bladder. UA is consistent with UTI, will treat with antibiotics. Foley was discontinued for patient comfort with plan for voiding trial.  She does have significant constipation on imaging, mild fecal impaction on examination - able to break up but not clear. No evidence of bowel obstruction. CT brain is without acute changes compared to previous studies. CMP does note elevation in her LFTs. She does not have right upper quadrant tenderness. She has a history of transaminitis secondary to her chemotherapy. She does not drink alcohol, has only taken acetaminophen once recently.  CT with possible left lower lobe infiltrate. Will provide coverage for possible pneumonia as well as UTI.  Plan to treat her constipation with repeat enema, relistor for opiate induced constipation.  Patient would like to go home if sxs controlled to go to her daughter's high school graduation party.  Patient care transferred pending additional treatment/recheck.    Final Clinical Impressions(s) / ED Diagnoses   Final diagnoses:  Drug-induced constipation  Acute UTI  Transaminitis    ED Discharge Orders        Ordered    azithromycin (ZITHROMAX) 250 MG tablet  Daily     03/11/18 1830    cephALEXin (KEFLEX) 500 MG capsule  4 times daily     03/11/18 1830  Quintella Reichert,  MD 03/11/18 [phone]

## 2018-03-11 NOTE — ED Notes (Signed)
Patient transported to CT 

## 2018-03-11 NOTE — ED Provider Notes (Signed)
6:33 PM Care assumed from Dr. Ralene Bathe.  At time of transfer care, patient has been diagnosed with a pneumonia, UTI and constipation.  Patient is awaiting a second enema as well as Relistor to try to help with her constipation.  There was 2 attempts at disimpaction over success with.  Patient is not interested in doing this again.  Patient will also need to void without the Foley catheter in place.  She was having retention previously.  If patient is able to have a bowel movement, anticipate discharge however if she is unable to have a BM and is still in severe pain, will reassess the situation.  Patient will be likely discharged with antibiotics.  After second enema, patient was able to have a large bowel movement.  Patient was feeling much better.  Patient was felt stable for discharge home given her improvement in symptoms.  Patient will be given prescription for the antibiotics.  Patient understood return precautions and PCP follow-up for reassessment and for further monitoring of her liver function testing is elevated.  Patient had no other questions or concerns and was discharged in good condition.  Clinical Impression: 1. Drug-induced constipation   2. Acute UTI   3. Transaminitis     Disposition: Discharge  Condition: Good  I have discussed the results, Dx and Tx plan with the pt(& family if present). He/she/they expressed understanding and agree(s) with the plan. Discharge instructions discussed at great length. Strict return precautions discussed and pt &/or family have verbalized understanding of the instructions. No further questions at time of discharge.    New Prescriptions   AZITHROMYCIN (ZITHROMAX) 250 MG TABLET    Take 1 tablet (250 mg total) by mouth daily.   CEPHALEXIN (KEFLEX) 500 MG CAPSULE    Take 1 capsule (500 mg total) by mouth 4 (four) times daily.    Follow Up: Vedia Coffer, [address] [place] [address] [phone]  Schedule an appointment as  soon as possible for a visit    [address], [address] [place] [address] [phone]  Call       Tegeler, Gwenyth Allegra, MD 03/12/18 [phone]

## 2018-03-14 LAB — URINE CULTURE: Culture: >=100000 — AB

## 2018-03-15 ENCOUNTER — Telehealth: Payer: Self-pay | Admitting: Emergency Medicine

## 2018-03-15 NOTE — Telephone Encounter (Signed)
Post ED Visit - Positive Culture Follow-up  Culture report reviewed by antimicrobial stewardship pharmacist:  []  Elenor Quinones, Pharm.D. []  Heide Guile, Pharm.D., BCPS AQ-ID []  Parks Neptune, Pharm.D., BCPS []  Alycia Rossetti, Pharm.D., BCPS []  [place], Pharm.D., BCPS, AAHIVP []  Legrand Como, Pharm.D., BCPS, AAHIVP []  Salome Arnt, PharmD, BCPS []  Wynell Balloon, PharmD []  Vincenza Hews, PharmD, BCPS [facility] PharmD  Positive urine culture Treated with azithromycin, and cephalexin, organism sensitive to the same and no further patient follow-up is required at this time.  Hazle Nordmann 03/15/2018, 1:25 PM

## 2018-04-03 ENCOUNTER — Encounter: Payer: Self-pay | Admitting: Allergy & Immunology

## 2018-04-03 ENCOUNTER — Ambulatory Visit (INDEPENDENT_AMBULATORY_CARE_PROVIDER_SITE_OTHER): Payer: BLUE CROSS/BLUE SHIELD | Admitting: Allergy & Immunology

## 2018-04-03 VITALS — BP 96/66 | HR 89 | Temp 97.7°F | Resp 16 | Ht 66.25 in | Wt 173.8 lb

## 2018-04-03 DIAGNOSIS — J3089 Other allergic rhinitis: Secondary | ICD-10-CM | POA: Diagnosis not present

## 2018-04-03 DIAGNOSIS — J302 Other seasonal allergic rhinitis: Secondary | ICD-10-CM

## 2018-04-03 DIAGNOSIS — J4540 Moderate persistent asthma, uncomplicated: Secondary | ICD-10-CM | POA: Diagnosis not present

## 2018-04-03 DIAGNOSIS — J984 Other disorders of lung: Secondary | ICD-10-CM

## 2018-04-03 MED ORDER — BUDESONIDE 180 MCG/ACT IN AEPB: 2.0000 | INHALATION_SPRAY | Freq: Two times a day (BID) | RESPIRATORY_TRACT | 5 refills | Status: DC

## 2018-04-03 MED ORDER — LEVALBUTEROL TARTRATE 45 MCG/ACT IN AERO: INHALATION_SPRAY | RESPIRATORY_TRACT | 5 refills | Status: DC

## 2018-04-03 MED ORDER — ALBUTEROL SULFATE HFA 108 (90 BASE) MCG/ACT IN AERS: INHALATION_SPRAY | RESPIRATORY_TRACT | 1 refills | Status: DC

## 2018-04-03 MED ORDER — BUDESONIDE 32 MCG/ACT NA SUSP: 1.0000 | Freq: Every day | NASAL | 5 refills | Status: AC

## 2018-04-03 NOTE — Progress Notes (Signed)
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 Mild persistent asthma, uncomplicated - Spirometry looked stable today. - We will not make any medication changes.  - Daily controller medication(s): Pulmicort 167mcg two puffs twice daily - Rescue medications: ProAir 4 puffs every 4-6 hours as needed - Changes during respiratory infections or worsening symptoms: increase Pulmicort 122mcg to 4 puffs twice daily for TWO WEEKS. - Asthma control goals:  * Full participation in all desired activities (may need albuterol before activity) * Albuterol use two time or less a week on average (not counting use with activity) * Cough interfering with sleep two time or less a month * Oral steroids no more than once a year * No hospitalizations  2. Chronic rhinitis - Continue with nasal saline rinses. - Continue with Nasacort 1-2 sprays per nostril daily as needed. - Continue with Zyrtec (cetirizine) 10mg  over the counter.   3. Return in about 6 months (around 10/04/2018).    Subjective:   [name] is a 42 y.o. female presenting today for follow up of  Chief Complaint  Patient presents with  . Follow-up    [name] has a history of the following: Patient Active Problem List   Diagnosis Date Noted  . 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 Asthma with acute exacerbation 01/23/2016  . Acute sinusitis 01/23/2016  . Allergic rhinitis 01/23/2016  . ILD (interstitial lung disease) ([place])  07/30/2012  . Dyspnea 07/16/2012    History obtained from: chart review and patient.  [name] Primary Care Provider is Carol Ada, MD.     [name] is a 42 y.o. female presenting for a follow up visit. She was last seen in August 2018. At that time, her lung function actually looked fairly good. She was continued on Pulmicort 180mg  two puffs once daily, increasing to four puffs twice daily for two weeks. Her rhinitis was controlled with nasal saline rinses as well as Nasacort 1-2 sprays per nostril daily PRN and cetirizine 10mg  daily. She has a complicated past medical history including brain cancer status post multiple surgeries and radiation treatments. She has a history of restrictive lung disease with evidence of interstitial fibrosis and granulomatous disease, which was diagnosed in 2013. She had skin testing in 2003 that was positive to pollens, weeds, trees, dust mite, cat, dog, and some molds.  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  The lesion is located in her cervical spinal cord around the C2 level, extending from the medulla downward.  She was restarted on a monthly reduced temozolomide.  In conjunction with this, she is also on olanzapine and Zofran daily for nausea.  She was diagnosed with a urinary tract infection and pneumonia in early June 2019 and completed a course of antibiotics.  She has had no seizures, but has had a foot drop with a decrease in her balance and gait. She has made it clear to the MA who checked her in that she does not want to discuss this at all.   Since  the last visit, she has done well from a breathing perspective. She thinks that this is from her chemotherapy. She is using it once every couple of weeks. She is on Pulmicort two puffs twice daily in the morning and at night. She has needed no steroid sfor her breathing. Raini's asthma has been well controlled. She has not required rescue  medication, experienced nocturnal awakenings due to lower respiratory symptoms, nor have activities of daily living been limited. She has required no Emergency Department or Urgent Care visits for her asthma. She has required zero courses of systemic steroids for asthma exacerbations since the last visit. ACT score today is 24, indicating excellent asthma symptom control. She denies feeling worse from a respiratory standpoint. She has a history of restrictive lung disease and has seen Dr. Chase Caller in the past; there have been no recent visits with him.   Rhinitis symptoms are controlled with the nasal saline rinses. She is using a nasal steroid as well as cetirizine, both of which she purchases over the counter. She is has remained busy with her children this summer. Her daughter is going to be going to [employer] and is still planning to major in nursing. Her son is in middle school. They have been spending a lot of time together recently and overall they are an extremely close family. She remains very proud of her children and becomes teary eyed when talking about them.   Otherwise, there have been no changes to her past medical history, surgical history, family history, or social history. 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     Review of Systems: a 14-point review of systems is pertinent for what is mentioned in HPI.  Otherwise, all other systems were negative. Constitutional: negative other than that listed in the HPI Eyes: negative other than that listed in the HPI Ears, nose, mouth, throat, and face: negative other than that listed in the HPI Respiratory: negative other than that listed in the HPI Cardiovascular: negative other than that listed in the HPI Gastrointestinal: negative other than that listed in the HPI Genitourinary: negative other than that listed in the HPI Integument: negative other than that listed in the HPI Hematologic:  negative other than that listed in the HPI Musculoskeletal: negative other than that listed in the HPI Neurological: negative other than that listed in the HPI Allergy/Immunologic: negative other than that listed in the HPI    Objective:   Blood pressure 96/66, pulse 89, temperature 97.7 F (36.5 C), temperature source Oral, resp. rate 16, height 5' 6.25" (1.683 m), weight 173 lb 12.8 oz (78.8 kg), SpO2 98 %. Body mass index is 27.84 kg/m.   Physical Exam:  General: Alert, somewhat interactive, in no acute distress. Tearful at times.  Eyes: No conjunctival injection bilaterally, no discharge on the right, no discharge on the left and no Horner-Trantas dots present. PERRL bilaterally. EOMI without pain. No photophobia.  Ears: Right TM pearly gray with normal light reflex, Left TM pearly gray with normal light reflex, Right TM intact without perforation and Left TM intact without perforation.  Nose/Throat: External nose within normal limits, nasal crease present and septum midline. Turbinates edematous with clear discharge. Posterior oropharynx erythematous without cobblestoning in the posterior oropharynx. Tonsils 2+ without exudates.  Tongue without thrush. Lungs: Clear to auscultation without wheezing, rhonchi or rales. No increased work of breathing. CV: Normal S1/S2. No murmurs. Capillary refill <2 seconds.  Skin: Warm and dry, without  lesions or rashes. Neuro:   Grossly intact. No focal deficits appreciated. Responsive to questions.  Diagnostic studies:   Spirometry: results abnormal (FEV1: 1.84/58%, FVC: 1.96/50%, FEV1/FVC: 94%).    Spirometry consistent with possible restrictive disease. Overall values are fairly stable. She has not been reversible in the past and we did not attempt it today.   Allergy Studies: none     Salvatore Marvel, MD  [facility]

## 2018-04-03 NOTE — Patient Instructions (Addendum)
1. Mild persistent asthma, uncomplicated - Spirometry looked stable today. - We will not make any medication changes.  - Daily controller medication(s): Pulmicort 137mcg two puffs twice daily - Rescue medications: ProAir 4 puffs every 4-6 hours as needed - Changes during respiratory infections or worsening symptoms: increase Pulmicort 135mcg to 4 puffs twice daily for TWO WEEKS. - Asthma control goals:  * Full participation in all desired activities (may need albuterol before activity) * Albuterol use two time or less a week on average (not counting use with activity) * Cough interfering with sleep two time or less a month * Oral steroids no more than once a year * No hospitalizations  2. Chronic rhinitis - Continue with nasal saline rinses. - Continue with Nasacort 1-2 sprays per nostril daily as needed. - Continue with Zyrtec (cetirizine) 10mg  over the counter.   3. Return in about 6 months (around 10/04/2018).    Please inform us of any Emergency Department visits, hospitalizations, or changes in symptoms. Call us before going to the ED for breathing or allergy symptoms since we might be able to fit you in for a sick visit. Feel free to contact us anytime with any questions, problems, or concerns.  It was a pleasure to see you again today!   Websites that have reliable patient information: 1. American Academy of Asthma, Allergy, and Immunology: www.aaaai.org 2. Food Allergy Research and Education (FARE): foodallergy.org 3. Mothers of Asthmatics: http://www.asthmacommunitynetwork.org 4. American College of Allergy, Asthma, and Immunology: www.acaai.org   Election Day is coming up on Tuesday, November 6th! Make your voice heard! Register to vote at vote.org!

## 2018-04-04 ENCOUNTER — Encounter: Payer: Self-pay | Admitting: Allergy & Immunology

## 2018-04-04 DIAGNOSIS — J4540 Moderate persistent asthma, uncomplicated: Secondary | ICD-10-CM | POA: Insufficient documentation

## 2018-04-04 DIAGNOSIS — J3089 Other allergic rhinitis: Secondary | ICD-10-CM

## 2018-04-04 DIAGNOSIS — J302 Other seasonal allergic rhinitis: Secondary | ICD-10-CM | POA: Insufficient documentation

## 2018-04-04 MED ORDER — CETIRIZINE HCL 10 MG PO TABS: ORAL_TABLET | ORAL | 1 refills | Status: DC

## 2018-04-07 DIAGNOSIS — M25552 Pain in left hip: Secondary | ICD-10-CM | POA: Insufficient documentation

## 2018-05-11 ENCOUNTER — Other Ambulatory Visit (HOSPITAL_COMMUNITY): Payer: Self-pay | Admitting: Orthopedic Surgery

## 2018-05-11 DIAGNOSIS — M545 Low back pain: Secondary | ICD-10-CM

## 2018-05-17 ENCOUNTER — Other Ambulatory Visit: Payer: Self-pay | Admitting: Orthopedic Surgery

## 2018-05-17 DIAGNOSIS — M545 Low back pain: Principal | ICD-10-CM

## 2018-05-17 DIAGNOSIS — G8929 Other chronic pain: Secondary | ICD-10-CM

## 2018-05-18 ENCOUNTER — Telehealth: Payer: Self-pay | Admitting: Nurse Practitioner

## 2018-05-18 NOTE — Telephone Encounter (Signed)
Phone call to patient to review medication list and allergies for myelogram procedure. Pt informed she would need to stop several medications for myelogram including nucynta. Pt refused to stop this medication. Informed pt we would not be able to perform procedure without stopping the medication, educated on risks associated. Pt verbalized understanding.

## 2018-06-01 ENCOUNTER — Telehealth: Payer: Self-pay

## 2018-06-01 NOTE — Telephone Encounter (Signed)
Patient called in complaining of difficulty getting a deep breath & gurgling in chest after eating. She states that she has had a nonproductive cough as well. This has all been ongoing for about a week and a half. She is currently on prednisone 20 mg po qd as prescribed by her oncologist. She did not increase Pulmicort to 4 puffs bid as instructed because she did not know. I have advised her to do this increase and I would speak with Dr. Ernst Bowler and see what further recommendations he ha.s

## 2018-06-01 NOTE — Telephone Encounter (Signed)
Reviewed note. Definitely do the Pulmicort. Also I would recommend using a nebulizer with albuterol every 4-6 hours to see if this would help break stuff up.   I am fine with sending a burst of prednisone (increase to 60mg  daily for five days and then decrease back to 20mg  daily).  Salvatore Marvel, MD [facility]

## 2018-06-02 MED ORDER — PREDNISONE 10 MG PO TABS: 60.0000 mg | ORAL_TABLET | Freq: Every day | ORAL | 0 refills | Status: DC

## 2018-06-02 NOTE — Addendum Note (Signed)
Addended by: Lucrezia Starch I on: 06/02/2018 12:13 PM   Modules accepted: Orders

## 2018-06-02 NOTE — Telephone Encounter (Signed)
Left message advising patient to call back.

## 2018-06-02 NOTE — Telephone Encounter (Signed)
I spoke with patient and patient's mom. They have been advised of the prednisone dose change. I have also sent in a prescription to cover the increased dosage.

## 2018-06-05 ENCOUNTER — Other Ambulatory Visit: Payer: Self-pay

## 2018-06-05 ENCOUNTER — Emergency Department (HOSPITAL_COMMUNITY): Payer: BLUE CROSS/BLUE SHIELD

## 2018-06-05 ENCOUNTER — Encounter (HOSPITAL_COMMUNITY): Payer: Self-pay | Admitting: Emergency Medicine

## 2018-06-05 ENCOUNTER — Emergency Department (HOSPITAL_COMMUNITY)
Admission: EM | Admit: 2018-06-05 | Discharge: 2018-06-05 | Disposition: A | Discharge status: Home / Self Care | Payer: BLUE CROSS/BLUE SHIELD | Attending: Emergency Medicine | Admitting: Emergency Medicine

## 2018-06-05 DIAGNOSIS — R1310 Dysphagia, unspecified: Secondary | ICD-10-CM

## 2018-06-05 DIAGNOSIS — R471 Dysarthria and anarthria: Secondary | ICD-10-CM | POA: Diagnosis not present

## 2018-06-05 DIAGNOSIS — R4701 Aphasia: Secondary | ICD-10-CM | POA: Diagnosis present

## 2018-06-05 DIAGNOSIS — J45909 Unspecified asthma, uncomplicated: Secondary | ICD-10-CM | POA: Insufficient documentation

## 2018-06-05 DIAGNOSIS — Z85841 Personal history of malignant neoplasm of brain: Secondary | ICD-10-CM | POA: Diagnosis not present

## 2018-06-05 DIAGNOSIS — J690 Pneumonitis due to inhalation of food and vomit: Secondary | ICD-10-CM | POA: Diagnosis not present

## 2018-06-05 DIAGNOSIS — Z79899 Other long term (current) drug therapy: Secondary | ICD-10-CM | POA: Diagnosis not present

## 2018-06-05 LAB — CBC
HCT: 38.8 % (ref 36.0–46.0)
Hemoglobin: 12.4 g/dL (ref 12.0–15.0)
MCH: 29.7 pg (ref 26.0–34.0)
MCHC: 32 g/dL (ref 30.0–36.0)
MCV: 92.8 fL (ref 78.0–100.0)
Platelets: 253 10*3/uL (ref 150–400)
RBC: 4.18 MIL/uL (ref 3.87–5.11)
RDW: 13.5 % (ref 11.5–15.5)
WBC: 13 10*3/uL — ABNORMAL HIGH (ref 4.0–10.5)

## 2018-06-05 LAB — I-STAT BETA HCG BLOOD, ED (MC, WL, AP ONLY): I-stat hCG, quantitative: <5 m[IU]/mL (ref <5)

## 2018-06-05 LAB — COMPREHENSIVE METABOLIC PANEL
ALK PHOS: 71 U/L (ref 38–126)
ALT: 51 U/L — AB (ref 0–44)
AST: 28 U/L (ref 15–41)
Albumin: 3.7 g/dL (ref 3.5–5.0)
Anion gap: 12 (ref 5–15)
BILIRUBIN TOTAL: 0.7 mg/dL (ref 0.3–1.2)
BUN: 18 mg/dL (ref 6–20)
CALCIUM: 9.3 mg/dL (ref 8.9–10.3)
CO2: 21 mmol/L — ABNORMAL LOW (ref 22–32)
CREATININE: 0.74 mg/dL (ref 0.44–1.00)
Chloride: 106 mmol/L (ref 98–111)
Glucose, Bld: 160 mg/dL — ABNORMAL HIGH (ref 70–99)
Potassium: 3.9 mmol/L (ref 3.5–5.1)
Sodium: 139 mmol/L (ref 135–145)
TOTAL PROTEIN: 7 g/dL (ref 6.5–8.1)

## 2018-06-05 LAB — I-STAT CHEM 8, ED
BUN: 20 mg/dL (ref 6–20)
CALCIUM ION: 1.15 mmol/L (ref 1.15–1.40)
CREATININE: 0.6 mg/dL (ref 0.44–1.00)
Chloride: 108 mmol/L (ref 98–111)
GLUCOSE: 158 mg/dL — AB (ref 70–99)
HCT: 38 % (ref 36.0–46.0)
HEMOGLOBIN: 12.9 g/dL (ref 12.0–15.0)
POTASSIUM: 3.9 mmol/L (ref 3.5–5.1)
Sodium: 138 mmol/L (ref 135–145)
TCO2: 21 mmol/L — ABNORMAL LOW (ref 22–32)

## 2018-06-05 LAB — DIFFERENTIAL
ABS IMMATURE GRANULOCYTES: 0.1 10*3/uL (ref 0.0–0.1)
Basophils Absolute: 0 10*3/uL (ref 0.0–0.1)
Basophils Relative: 0 %
Eosinophils Absolute: 0 10*3/uL (ref 0.0–0.7)
Eosinophils Relative: 0 %
Immature Granulocytes: 1 %
LYMPHS ABS: 1 10*3/uL (ref 0.7–4.0)
Lymphocytes Relative: 8 %
MONO ABS: 0.3 10*3/uL (ref 0.1–1.0)
MONOS PCT: 3 %
NEUTROS ABS: 11.5 10*3/uL — AB (ref 1.7–7.7)
NEUTROS PCT: 88 %

## 2018-06-05 LAB — PROTIME-INR
INR: 0.94
Prothrombin Time: 12.4 seconds (ref 11.4–15.2)

## 2018-06-05 LAB — I-STAT TROPONIN, ED: TROPONIN I, POC: 0.01 ng/mL (ref 0.00–0.08)

## 2018-06-05 LAB — APTT: aPTT: 29 seconds (ref 24–36)

## 2018-06-05 MED ORDER — CLINDAMYCIN HCL 300 MG PO CAPS: 300.0000 mg | ORAL_CAPSULE | Freq: Three times a day (TID) | ORAL | 0 refills | Status: AC

## 2018-06-05 MED ORDER — DEXAMETHASONE 4 MG PO TABS: 4.0000 mg | ORAL_TABLET | Freq: Two times a day (BID) | ORAL | 0 refills | Status: AC

## 2018-06-05 MED ORDER — DEXAMETHASONE SODIUM PHOSPHATE 10 MG/ML IJ SOLN: 10.0000 mg | Freq: Once | INTRAMUSCULAR | Status: DC

## 2018-06-05 MED ORDER — DEXAMETHASONE SODIUM PHOSPHATE 10 MG/ML IJ SOLN
4.0000 mg | Freq: Once | INTRAMUSCULAR | Status: AC
  Administered 2018-06-05: 4 mg via INTRAVENOUS
  Filled 2018-06-05: qty 1

## 2018-06-05 MED ORDER — CLINDAMYCIN PHOSPHATE 600 MG/50ML IV SOLN
600.0000 mg | Freq: Once | INTRAVENOUS | Status: AC
  Administered 2018-06-05: 600 mg via INTRAVENOUS
  Filled 2018-06-05: qty 50

## 2018-06-05 MED ORDER — GADOBENATE DIMEGLUMINE 529 MG/ML IV SOLN
10.0000 mL | Freq: Once | INTRAVENOUS | Status: AC | PRN
  Administered 2018-06-05: 10 mL via INTRAVENOUS

## 2018-06-05 NOTE — ED Provider Notes (Signed)
[facility] EMERGENCY DEPARTMENT Provider Note   CSN: [id number] Arrival date & time: 06/05/18  1451     History   Chief Complaint Chief Complaint  Patient presents with  . Aphasia  . Choking    HPI [name] is a 42 y.o. female.  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  The patient states that over the last week and a half, she has had progressively worsening difficulty swallowing.  She is began choking every time she swallows.  She has subsequently begun coughing and her primary pulmonologist started her on prednisone for this.  She has had increased slurred speech as well.  Denies any new numbness.  She does have chronic weakness in her left leg, which is also slightly worsened.  She has not had any fevers or chills.  She denies any worsening headaches.  Denies any shortness of breath currently.  Her coughing seems to be directly associated with attempts to swallow.  She is able to drink liquids, but cannot swallow solids.  She has a history of dysphasia secondary to her tumor, but this is significantly worse.  No other new complaints.  Past Medical History:  Diagnosis Date  . Anxiety   . Arthritis    arthritis in knees  . Asthma    asthma since a child. Well controlled  . Brain tumor ([place])   . Brain tumor, glioma ([place])   . 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 Kidney stone   . Migraine   . Pneumonia    last time 2 yr ago  . Recurrent upper respiratory infection (URI)   . Urticaria     Patient Active Problem List   Diagnosis Date Noted  . Moderate persistent asthma without complication 29/79/8921  . Seasonal and perennial allergic rhinitis 04/04/2018  . 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 Asthma with acute exacerbation 01/23/2016  . Acute sinusitis 01/23/2016  . Allergic rhinitis 01/23/2016  . ILD (interstitial lung  disease) ([place]) 07/30/2012  . Dyspnea 07/16/2012    Past Surgical History:  Procedure Laterality Date  . BRAIN SURGERY  2015   Brain surgery x 4 in 2015 at [facility]    . CHOLECYSTECTOMY    . CYSTOSCOPY W/ URETERAL STENT PLACEMENT Right 10/26/2016   Procedure: CYSTOSCOPY WITH RETROGRADE PYELOGRAM/URETERAL STENT PLACEMENT;  Surgeon: [name], MD;  Location: [facility];  Service: Urology;  Laterality: Right;  . CYSTOSCOPY WITH RETROGRADE PYELOGRAM, URETEROSCOPY AND STENT PLACEMENT Right 10/18/2014   Procedure: CYSTOSCOPY WITH RETROGRADE PYELOGRAM, URETEROSCOPY, STONE EXTRACTION AND STENT PLACEMENT;  Surgeon: Arvil Persons, MD;  Location: [facility];  Service: Urology;  Laterality: Right;  . CYSTOSCOPY WITH RETROGRADE PYELOGRAM, URETEROSCOPY AND STENT PLACEMENT Right 10/29/2016   Procedure: CYSTOSCOPY WITH RETROGRADE PYELOGRAM, URETEROSCOPY AND STENT PLACEMENT;  Surgeon: [name], MD;  Location: [facility];  Service: Urology;  Laterality: Right;  . CYSTOSTOMY W/ STENT INSERTION     numerous times  . HOLMIUM LASER APPLICATION Right 1/94/1740   Procedure: HOLMIUM LASER APPLICATION;  Surgeon: Arvil Persons, MD;  Location: [facility];  Service: Urology;  Laterality: Right;  . HOLMIUM LASER APPLICATION Right 05/17/4817   Procedure: HOLMIUM LASER APPLICATION;  Surgeon: [name], MD;  Location: [facility];  Service: Urology;  Laterality: Right;  . LASER ABLATION OF THE CERVIX  OB History   None      Home Medications    Prior to Admission medications   Medication Sig Start Date End Date Taking? Authorizing Provider  amphetamine-dextroamphetamine (ADDERALL XR) 10 MG 24 hr capsule Take 20 mg by mouth daily.   Yes [provider]  budesonide (PULMICORT FLEXHALER) 180 MCG/ACT inhaler Inhale 2 puffs into the lungs 2 (two) times daily. Patient taking differently: Inhale 4 puffs into the lungs 2 (two) times daily.  04/03/18  Yes Valentina Shaggy, MD    budesonide (RHINOCORT AQUA) 32 MCG/ACT nasal spray Place 1 spray into both nostrils daily. Patient taking differently: Place 1 spray into both nostrils at bedtime.  04/03/18  Yes Valentina Shaggy, MD  cetirizine (ZYRTEC) 10 MG tablet 1 tablet once daily for runny nose/itching Patient taking differently: Take 10 mg by mouth at bedtime. for runny nose/itching 04/04/18  Yes Valentina Shaggy, MD  Cranberry 500 MG CAPS Take 500 mg by mouth at bedtime.    Yes [provider]  diazepam (VALIUM) 5 MG tablet Take 5 mg by mouth daily as needed for anxiety (sleep).   Yes [provider]  docusate sodium (COLACE) 100 MG capsule Take 100 mg by mouth at bedtime.   Yes [provider]  Eszopiclone 3 MG TABS Take 3 mg by mouth daily at 10 pm. Reported on 01/23/2016   Yes [provider]  HYDROmorphone (DILAUDID) 4 MG tablet Take 1-3 tablets every 6 hours as needed for pain Patient taking differently: Take 4 mg by mouth See admin instructions. Take one tablet (4 mg) by mouth twice daily - 6am and 2pm 11/07/16  Yes Sukhu, Milford Cage, MD  ibuprofen (ADVIL,MOTRIN) 200 MG tablet Take 400 mg by mouth every 6 (six) hours as needed (pain).   Yes [provider]  levalbuterol (XOPENEX HFA) 45 MCG/ACT inhaler USE 1 TO 2 INHALATIONS EVERY 4 HOURS AS NEEDED FOR WHEEZING Patient taking differently: Inhale 2 puffs into the lungs every 4 (four) hours as needed for wheezing or shortness of breath.  04/03/18  Yes Valentina Shaggy, MD  MEGARED OMEGA-3 KRILL OIL 500 MG CAPS Take 500 mg by mouth at bedtime.   Yes [provider]  OLANZapine (ZYPREXA) 10 MG tablet Take 10 mg by mouth at bedtime. 03/06/18  Yes [provider]  omeprazole (PRILOSEC OTC) 20 MG tablet Take 20 mg by mouth at bedtime.   Yes [provider]  promethazine (PHENERGAN) 25 MG tablet Take 25 mg by mouth daily as needed for nausea or vomiting.    Yes [provider]  Tapentadol HCl  (NUCYNTA ER) 150 MG TB12 Take 150 mg by mouth every 12 (twelve) hours. 0600 and 1800    Yes [provider]  temazepam (RESTORIL) 15 MG capsule Take 15 mg by mouth See admin instructions. Take one capsule (15 mg) by mouth in the middle of the night as needed for sleep 07/29/16  Yes [provider]  albuterol [facility] HFA) 108 (90 Base) MCG/ACT inhaler 4 puffs every 4-6 hours Patient not taking: Reported on 06/05/2018 04/03/18   Valentina Shaggy, MD  clindamycin (CLEOCIN) 300 MG capsule Take 1 capsule (300 mg total) by mouth 3 (three) times daily for 7 days. 06/05/18 06/12/18  Duffy Bruce, MD  dexamethasone (DECADRON) 4 MG tablet Take 1 tablet (4 mg total) by mouth 2 (two) times daily with a meal for 5 days. 06/05/18 06/10/18  Duffy Bruce, MD  tamsulosin [facility]) 0.4  MG CAPS capsule Take 1 capsule (0.4 mg total) by mouth daily after supper. Patient not taking: Reported on 06/05/2018 06/17/17   Delia Heady, PA-C    Family History Family History  Problem Relation Age of Onset  . Allergies Father   . Heart disease Mother   . Clotting disorder Mother   . Heart disease Maternal Grandmother   . Heart disease Paternal Grandmother   . Cancer Unknown        grandparents  . Urticaria Neg Hx   . Immunodeficiency Neg Hx   . Eczema Neg Hx   . Atopy Neg Hx   . Asthma Neg Hx   . Angioedema Neg Hx   . Allergic rhinitis Neg Hx     Social History Social History   Tobacco Use  . Smoking status: Never Smoker  . Smokeless tobacco: Never Used  Substance Use Topics  . Alcohol use: No    Alcohol/week: 0.0 standard drinks  . Drug use: No     Allergies   Penicillins; Albuterol; Codeine; Compazine [prochlorperazine edisylate]; Diclofenac; Midazolam; and Betasept surgical scrub [chlorhexidine gluconate]   Review of Systems Review of Systems  Constitutional: Positive for fatigue. Negative for chills and fever.  HENT: Positive for trouble swallowing. Negative for congestion,  rhinorrhea and sore throat.   Eyes: Negative for visual disturbance.  Respiratory: Positive for cough. Negative for shortness of breath and wheezing.   Cardiovascular: Negative for chest pain and leg swelling.  Gastrointestinal: Negative for abdominal pain, diarrhea, nausea and vomiting.  Genitourinary: Negative for dysuria, flank pain, vaginal bleeding and vaginal discharge.  Musculoskeletal: Negative for neck pain.  Skin: Negative for rash.  Allergic/Immunologic: Negative for immunocompromised state.  Neurological: Positive for weakness. Negative for syncope and headaches.  Hematological: Does not bruise/bleed easily.  All other systems reviewed and are negative.    Physical Exam Updated Vital Signs BP 112/67   Pulse 84   Temp 98.1 F (36.7 C) (Oral)   Resp 18   Ht 5\' 9"  (1.753 m)   Wt 72.6 kg   SpO2 97%   BMI 23.63 kg/m   Physical Exam  Constitutional: She is oriented to person, place, and time. She appears well-developed and well-nourished. No distress.  HENT:  Head: Normocephalic and atraumatic.  Eyes: Conjunctivae are normal.  Neck: Neck supple.  Cardiovascular: Normal rate, regular rhythm and normal heart sounds. Exam reveals no friction rub.  No murmur heard. Pulmonary/Chest: Effort normal. No respiratory distress. She has no wheezes. She has rales (R basilar).  Abdominal: She exhibits no distension.  Musculoskeletal: She exhibits no edema.  Neurological: She is alert and oriented to person, place, and time. She exhibits normal muscle tone.  Skin: Skin is warm. Capillary refill takes less than 2 seconds.  Psychiatric: She has a normal mood and affect.  Nursing note and vitals reviewed.   Neurological Exam:  Mental Status: Alert and oriented to person, place, and time. Attention and concentration normal. Speech slurred but not aphasic. Recent memory is intact. Cranial Nerves: EOMI and PERRLA. No nystagmus noted. No facial asymmetry or weakness. Hearing grossly  normal. Uvula is midline, and palate elevates symmetrically. Normal SCM and trapezius strength. Tongue protrudes to right. Motor: Muscle strength 5/5 in proximal and distal UE and LE on right, diminished LLE. Coordination: Normal FTN bilaterally.   ED Treatments / Results  Labs (all labs ordered are listed, but only abnormal results are displayed) Labs Reviewed  CBC - Abnormal; Notable for the following components:  Result Value   WBC 13.0 (*)    All other components within normal limits  DIFFERENTIAL - Abnormal; Notable for the following components:   Neutro Abs 11.5 (*)    All other components within normal limits  COMPREHENSIVE METABOLIC PANEL - Abnormal; Notable for the following components:   CO2 21 (*)    Glucose, Bld 160 (*)    ALT 51 (*)    All other components within normal limits  I-STAT CHEM 8, ED - Abnormal; Notable for the following components:   Glucose, Bld 158 (*)    TCO2 21 (*)    All other components within normal limits  PROTIME-INR  APTT  I-STAT TROPONIN, ED  CBG MONITORING, ED  I-STAT BETA HCG BLOOD, ED ([facility], [facility], [facility] ONLY)    EKG EKG Interpretation  Date/Time:  Monday June 05 2018 14:58:42 EDT Ventricular Rate:  102 PR Interval:  128 QRS Duration: 86 QT Interval:  316 QTC Calculation: 411 R Axis:   60 Text Interpretation:  Sinus tachycardia Otherwise normal ECG Since last EKG, TWI in lead III, aVF are new though have been previously noted Confirmed by Duffy Bruce [phone]) on 06/05/2018 4:49:30 PM   Radiology Dg Chest 2 View  Result Date: 06/05/2018 CLINICAL DATA:  Shortness of breath EXAM: CHEST - 2 VIEW COMPARISON:  06/14/2012 FINDINGS: Right-sided shunt catheter. Calcified lung nodules. No acute consolidation or effusion. Normal cardiomediastinal silhouette. No pneumothorax. IMPRESSION: No active cardiopulmonary disease. Electronically Signed   By: Donavan Foil M.D.   On: 06/05/2018 15:52   Ct Head Wo Contrast  Result Date:  06/05/2018 CLINICAL DATA:  Slurred speech and difficulty swallowing for the past week and a half. EXAM: CT HEAD WITHOUT CONTRAST TECHNIQUE: Contiguous axial images were obtained from the base of the skull through the vertex without intravenous contrast. COMPARISON:  Head CT scan 03/11/2018. FINDINGS: Brain: No evidence of acute infarction, hemorrhage, hydrocephalus, extra-axial collection or mass lesion/mass effect. Right frontal approach ventriculostomy shunt catheter is in place and intact. The patient is status post suboccipital craniectomy. Prominence of the brainstem is unchanged. Cavum septum pellucidum et vergae noted. Vascular: No hyperdense vessel or unexpected calcification. Skull: No fracture or focal lesion. Sinuses/Orbits: Negative. Other: None. IMPRESSION: No acute abnormality.  Stable compared to prior exam. Electronically Signed   By: Inge Rise M.D.   On: 06/05/2018 16:08   Mr Jeri Cos AO Contrast  Result Date: 06/05/2018 CLINICAL DATA:  History of brainstem glioma, recently finished chemotherapy. 1.5 weeks of dysphagia, shortness of breath and slurred speech. EXAM: MRI HEAD WITHOUT AND WITH CONTRAST TECHNIQUE: Multiplanar, multiecho pulse sequences of the brain and surrounding structures were obtained without and with intravenous contrast. CONTRAST:  42mL MULTIHANCE GADOBENATE DIMEGLUMINE 529 MG/ML IV SOLN COMPARISON:  CT HEAD June 05, 2018 and MRI of the head December 01, 2013. MRI head report from outside institution dated May 16, 2018 though images are not available for direct comparison. FINDINGS: INTRACRANIAL CONTENTS: Expansile, infiltrative pontine medullary 3 x 3.2 cm mass extends into proximal spinal cord as previously reported. Heterogeneous reduced diffusion with normalized ADC values compatible with tumor and T2 shine through. Faint susceptibility ventral pons is likely treatment related without susceptibility artifact to suggest acute blood products though somewhat  limited by artifact from ventriculoperitoneal shunt. No discernible abnormal enhancement within the tumor elsewhere in the brain. Post treatment capacious fourth ventricle without hydrocephalus. Mildly atrophic cerebellum and vermis. Ventriculoperitoneal shunt via a RIGHT frontal burr hole with minimal gliosis along the tract.  No hydrocephalus or MR findings of over shunting. A few scattered supratentorial white matter FLAIR T2 hyperintensities compatible with chronic small vessel ischemic changes. No supratentorial abnormal signal, mass effect or midline shift. No reduced diffusion to suggest acute ischemia. No abnormal extra-axial fluid collections. Resolution of suboccipital fluid collection from prior MRI. VASCULAR: Normal major intracranial vascular flow voids present at skull base. SKULL AND UPPER CERVICAL SPINE: Status post suboccipital craniectomy. No abnormal sellar expansion. No suspicious calvarial bone marrow signal. Craniocervical junction maintained. SINUSES/ORBITS: The mastoid air-cells and included paranasal sinuses are well-aerated.The included ocular globes and orbital contents are non-suspicious. OTHER: None. IMPRESSION: 1. No acute intracranial process. 2. Infiltrative pontomedullary glioma, relatively stable by report with exception of no enhancement on today's examination, this may be technical. Recommend direct comparison with prior imaging when available. 3. Ventriculoperitoneal shunt without hydrocephalus or MR findings of over shunting. Electronically Signed   By: Elon Alas M.D.   On: 06/05/2018 22:25   Mr Lumbar Spine Wo Contrast  Result Date: 06/05/2018 CLINICAL DATA:  Worsening LEFT lower extremity weakness. History of brain tumor. EXAM: MRI LUMBAR SPINE WITHOUT CONTRAST TECHNIQUE: Multiplanar, multisequence MR imaging of the lumbar spine was performed. No intravenous contrast was administered. COMPARISON:  CT abdomen and pelvis March 11, 2018 and MRI lumbar spine December 01, 2013 FINDINGS: SEGMENTATION: For the purposes of this report, the last well-formed intervertebral disc is reported as L5-S1. ALIGNMENT: Maintained lumbar lordosis. No malalignment. VERTEBRAE:Vertebral bodies are intact. Similar moderate to severe L5-S1 disc height loss with mild chronic discogenic endplate changes and disc desiccation. The remaining discs maintain with slight desiccation L4-5. No suspicious or abnormal bone marrow signal. CONUS MEDULLARIS AND CAUDA EQUINA: Conus medullaris terminates at L1-2 and demonstrates normal morphology and signal characteristics. Fluid signal central spinal canal is normal. Cauda equina is normal. PARASPINAL AND OTHER SOFT TISSUES: Nonacute. Subcentimeter T2 bright cyst bilateral kidneys. Subcentimeter low T2 calmer bright T1 presumably proteinaceous cyst LEFT kidney. DISC LEVELS: L1-2 through L3-4: No disc bulge, canal stenosis nor neural foraminal narrowing. L4-5: Annular bulging. Mild facet arthropathy without canal stenosis or neural foraminal narrowing. L5-S1: Similar small broad-based disc bulge and central disc extrusion with 4 mm continuous caudal migration. Mild facet arthropathy. No canal stenosis. Mild bilateral L5-S1 neural foraminal narrowing. IMPRESSION: 1. No fracture, malalignment or acute osseous process. 2. Similar appearance of lumbar spine. No canal stenosis. Mild L5-S1 neural foraminal narrowing. Electronically Signed   By: Elon Alas M.D.   On: 06/05/2018 22:00    Procedures Procedures (including critical care time)  Medications Ordered in ED Medications  clindamycin (CLEOCIN) IVPB 600 mg (0 mg Intravenous Stopped 06/05/18 2051)  dexamethasone (DECADRON) injection 4 mg (4 mg Intravenous Given 06/05/18 2016)  gadobenate dimeglumine (MULTIHANCE) injection 10 mL (10 mLs Intravenous Contrast Given 06/05/18 2155)     Initial Impression / Assessment and Plan / ED Course  I have reviewed the triage vital signs and the nursing notes.  Pertinent  labs & imaging results that were available during my care of the patient were reviewed by me and considered in my medical decision making (see chart for details).     42 yo F here with worsening difficulty swallowing, coughing. Pt has extensive PMHx as above with known brainstem glioma. I discussed the case with Dr. Baltazar Najjar of Heme-Onc - concern for possible tumor-related edema. He recommends MRI today, switching to decadron. Will give IV dose here, discuss w/ Radiology. Pt protecting airway, no signs of airway compromise or  difficulty tolerating secretions and she's tolerating liquids w/o difficulty. Labs are reassuring. CXR without focal PNA but given history, I feel it's reasonable to treat empirically.  I had a long discussion with the patient in radiology.  The patient has a VP shunt but it is able to undergo MRI but will need to be checked by her neurosurgeon in 24 to 48 hours.  The patient and husband are adamant that this is doable and they would like to remain here for MRI.  They understand the risks of not having it checked.  MRI subsequently obtained and fortunately shows no significant progression, edema, hemorrhage, or other acute complication.  Of note, she has had worsening numbness and weakness of her left leg, so MRI of the lumbar spine was also obtained which shows no new masses or abnormalities.  Patient has been able to tolerate liquids here in the ED without difficulty.  Unfortunately, I suspect this is progression of her brainstem glioma.  Given that there is no new edema, hemorrhage, or other acute complication, discussed management options with the patient and family.  Current plan is to switch to Decadron for better CNS penetration, cover empirically for possible aspiration, switch to an aspiration diet and have her follow-up with her oncologist in 24 to 48 hours.  Return precautions given.  She is aware that she needs to have her shunt checked by her neurosurgeon.  Final Clinical  Impressions(s) / ED Diagnoses   Final diagnoses:  Dysarthria  Aspiration pneumonia, unspecified aspiration pneumonia type, unspecified laterality, unspecified part of lung ([place])  Dysphagia, unspecified type    ED Discharge Orders         Ordered    clindamycin (CLEOCIN) 300 MG capsule  3 times daily     06/05/18 2259    dexamethasone (DECADRON) 4 MG tablet  2 times daily with meals     06/05/18 2259           Duffy Bruce, MD 06/05/18 2354

## 2018-06-05 NOTE — ED Notes (Signed)
IV attempted x's 2 without success.  Pt refuses a third stick.  Dr. Ellender Hose made aware and will attempt IV via ultrasound

## 2018-06-05 NOTE — Discharge Instructions (Addendum)
As we discussed, it is critically important that you set up an appointment with Dr. Salomon Fick or his team in the next 48 hours for checkup of your shunt.  You should also call Dr. Maylon Peppers in the morning to discuss your recent visit and further planning.  For now, we will switch your prednisone to dexamethasone per oncology recommendations.  I will also start you empirically on antibiotics for possible aspiration pneumonia.

## 2018-06-05 NOTE — ED Triage Notes (Signed)
Pt states that for approx 1.5 weeks she has had trouble swallowing, shortness of breath, and slurred speech. Pt recently finished her second round of chemo therapy for brain cancer on June 30th. She states that she is on prednisone currently and on Pulmicort inhalers.

## 2018-06-05 NOTE — ED Notes (Signed)
Pt to MRI at this time.

## 2018-06-12 ENCOUNTER — Other Ambulatory Visit: Payer: Self-pay | Admitting: Allergy & Immunology

## 2018-06-21 ENCOUNTER — Other Ambulatory Visit: Payer: Self-pay | Admitting: Allergy & Immunology

## 2018-06-21 NOTE — Telephone Encounter (Signed)
Please advise on refill.

## 2018-06-22 NOTE — Telephone Encounter (Signed)
Please call before sending this course of prednisone. We gave her a prednisone burst in late August. If she needs another burst of prednisone, we should probably just see her.  Thanks, Salvatore Marvel, MD [facility] [place]

## 2018-06-23 NOTE — Telephone Encounter (Signed)
Lmom for patient to contact the office to schedule appt for refill on prednisone.

## 2018-07-03 IMAGING — US US RENAL
1 series · 14 of 25 positions shown · non-contrast
Comparison: 10/26/2016 CT

CLINICAL DATA: Right flank pain and hematuria for 1 week. Right
ureteral stone removal, lithotripsy and stent placement on
10/29/2016.

EXAM:
RENAL / URINARY TRACT ULTRASOUND COMPLETE

[Series 1: us renal · 0.28mm/px · 14 of 83 slices shown]
[im 1/83]
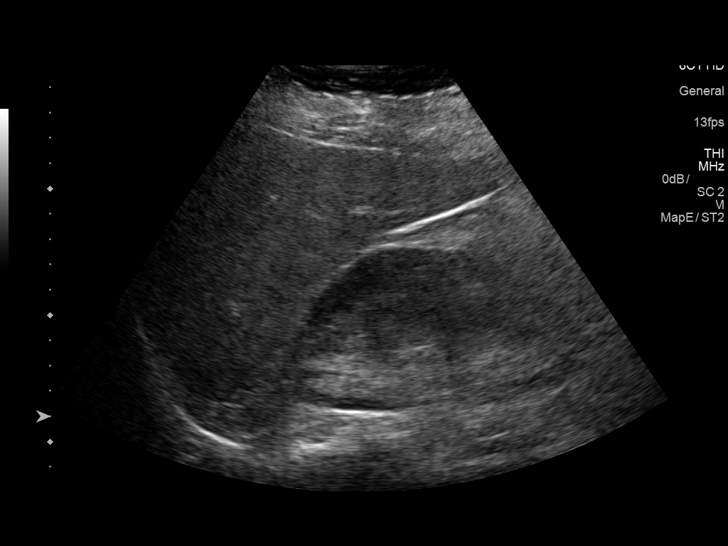
[im 7/83]
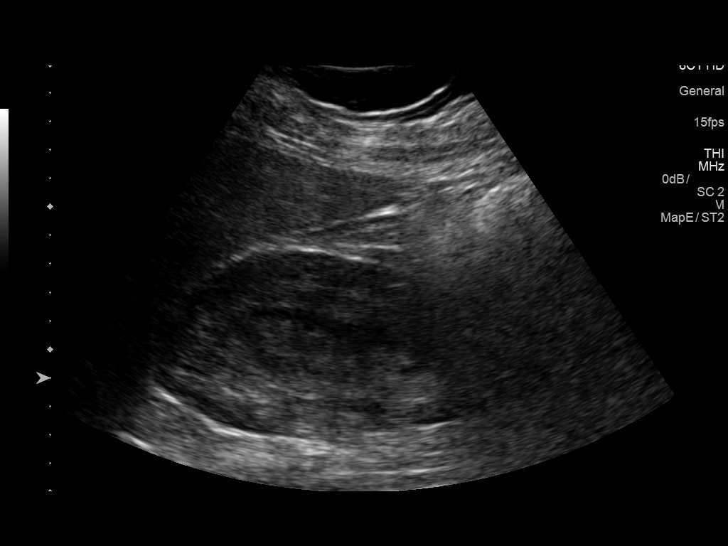
[im 14/83]
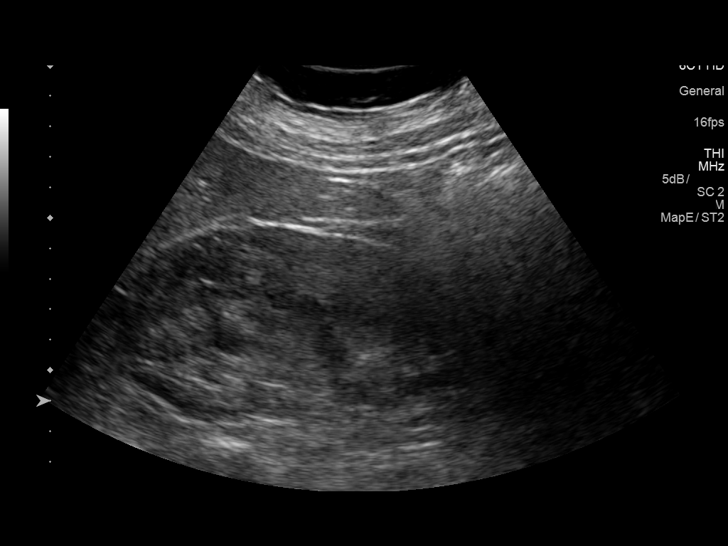
[im 21/83]
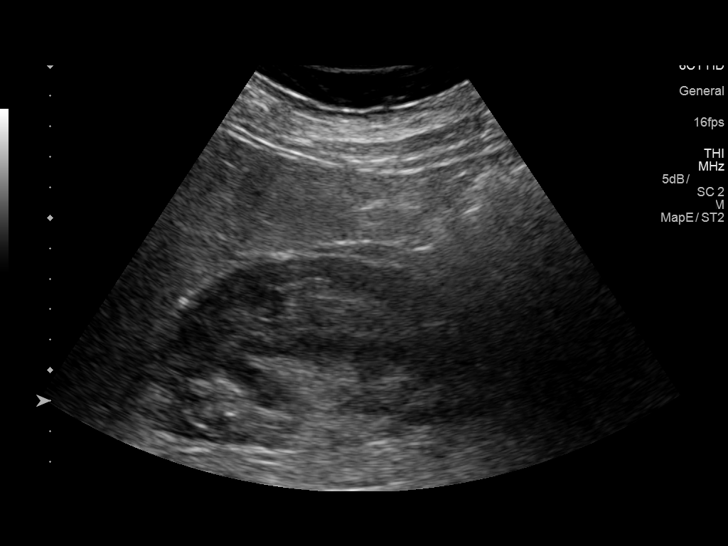
[im 28/83]
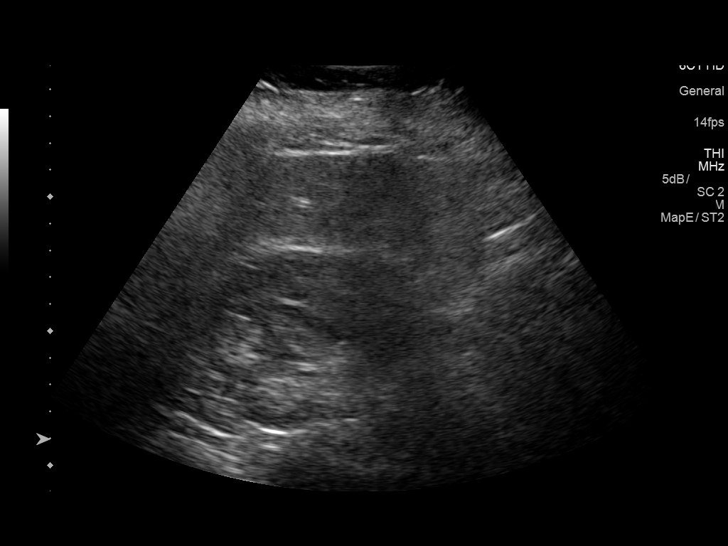
[im 31/83]
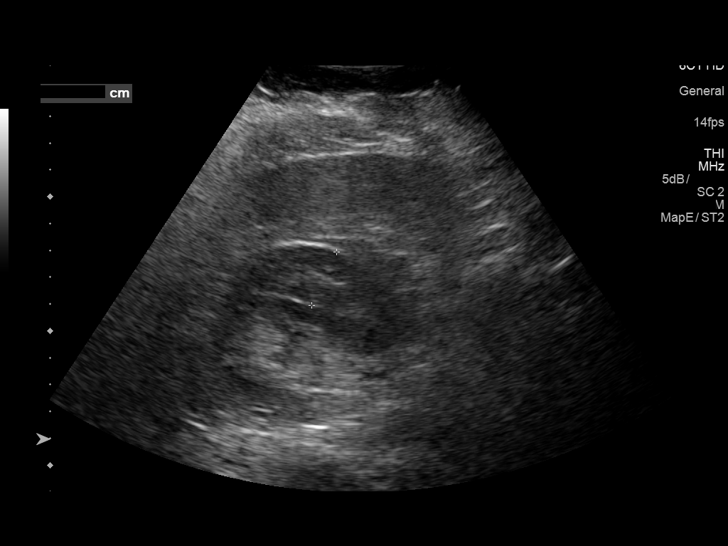
[im 38/83]
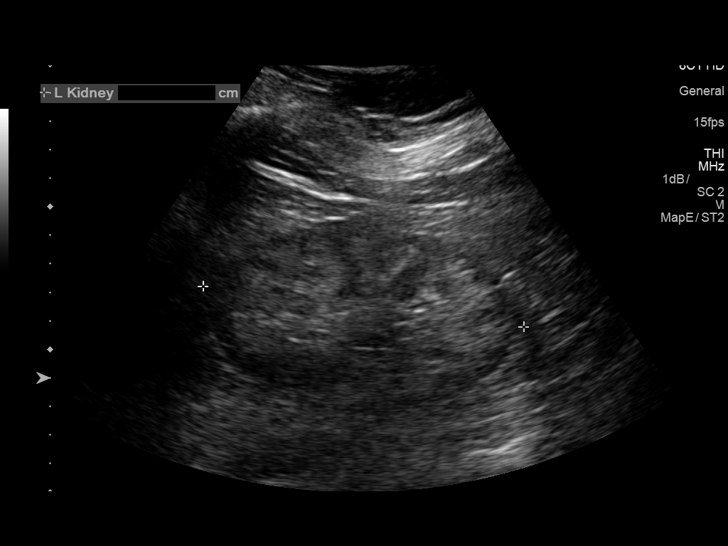
[im 45/83]
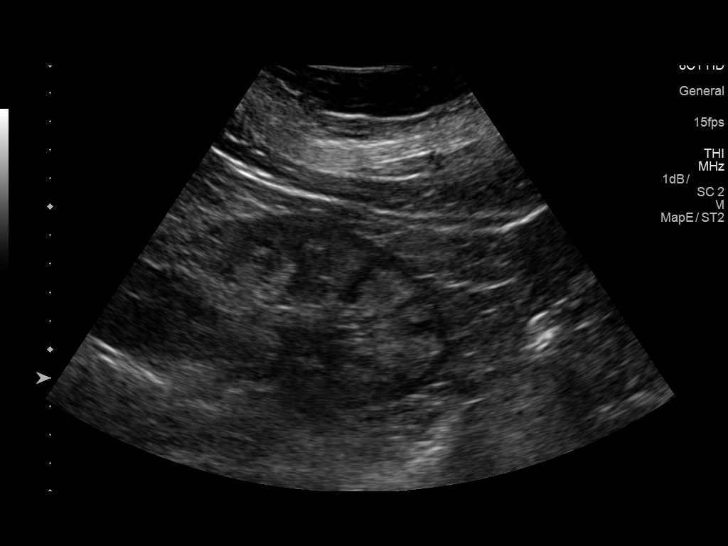
[im 52/83]
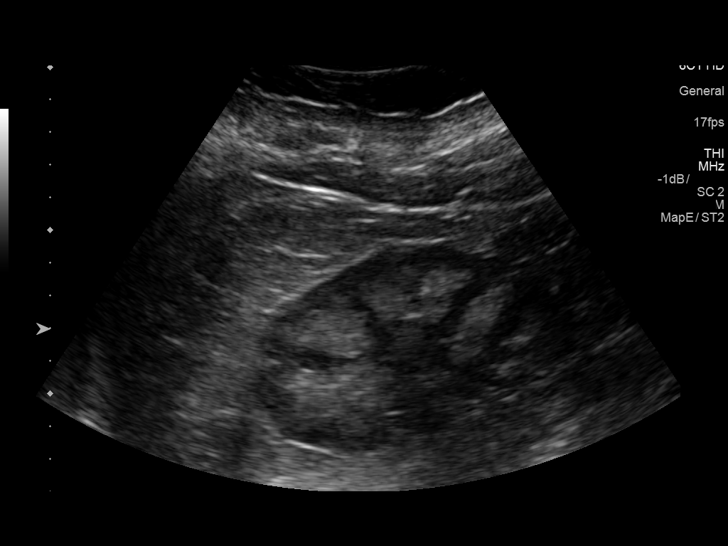
[im 55/83]
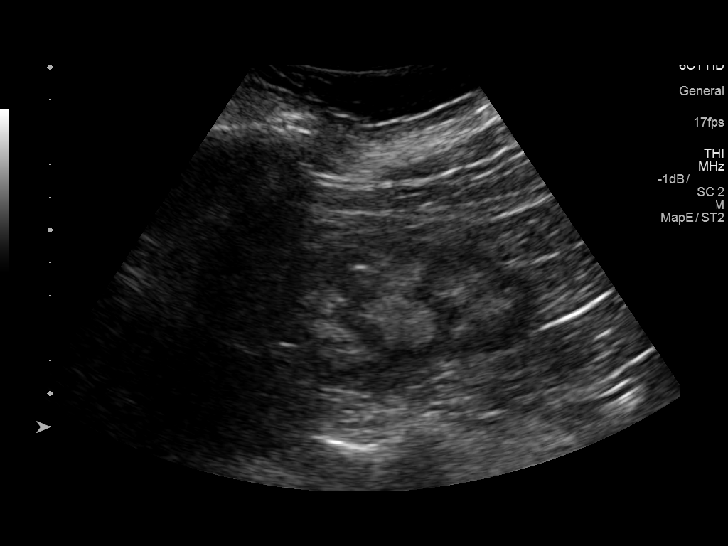
[im 62/83]
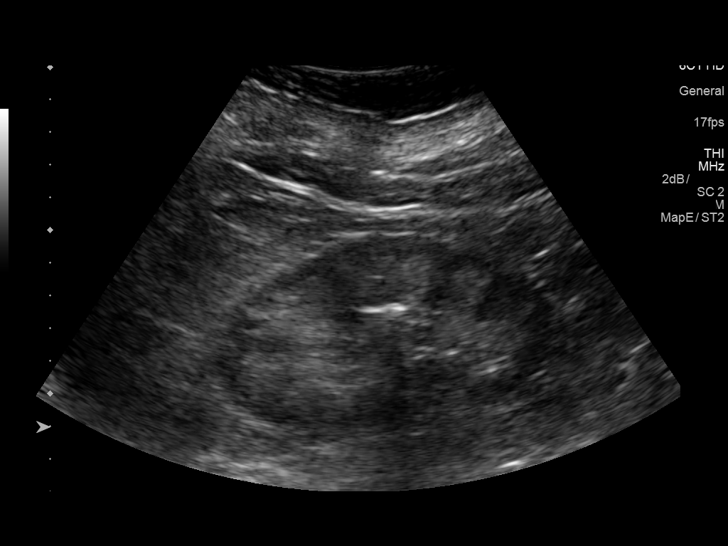
[im 69/83]
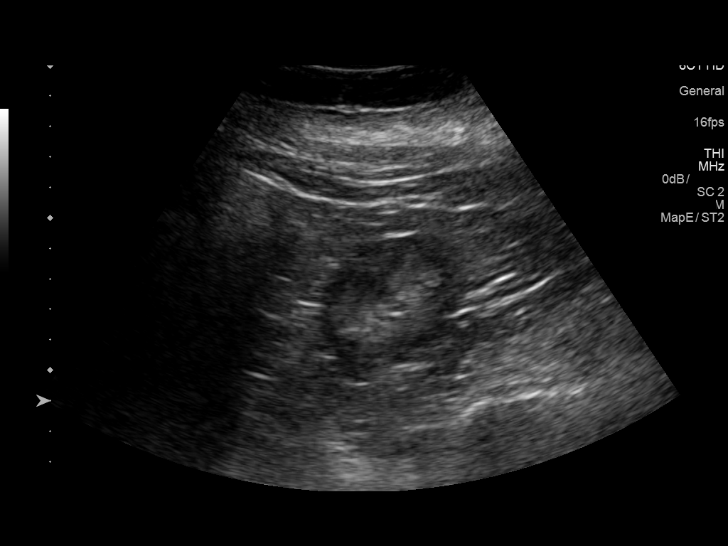
[im 76/83]
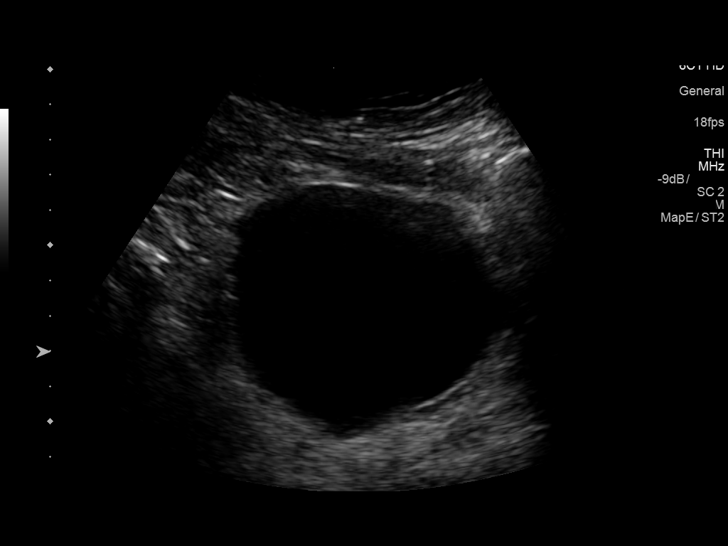
[im 83/83]
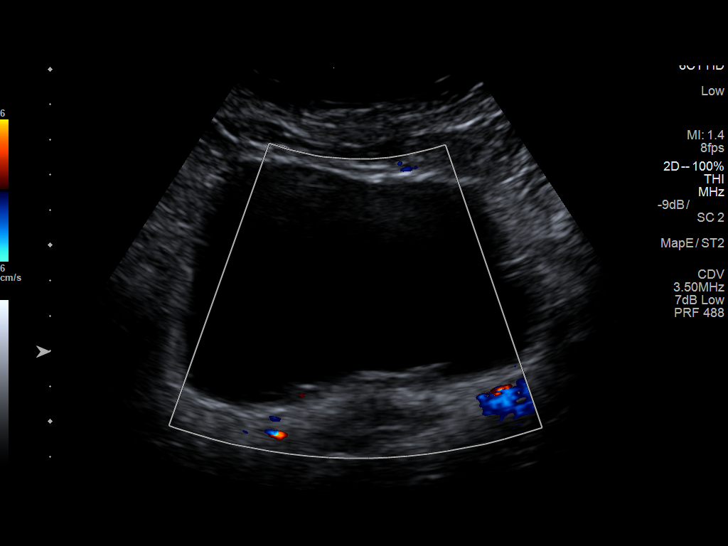

[14 of 25 positions shown; findings below may reference images not displayed]

FINDINGS: Right Kidney:

Length: 12.8 cm. A moderate perinephric hematoma is noted measuring
2 cm and greatest transverse diameter. There is no evidence of
hydronephrosis. Cortical thinning noted.

Left Kidney:

Length: 11.3 cm. Echogenicity within normal limits. No mass or
hydronephrosis visualized. Cortical thinning noted.

Bladder:

Appears normal for degree of bladder distention.
IMPRESSION: Moderate right perinephric hematoma.

No evidence of hydronephrosis.

## 2018-07-04 DIAGNOSIS — R1310 Dysphagia, unspecified: Secondary | ICD-10-CM | POA: Insufficient documentation

## 2018-07-05 DIAGNOSIS — C719 Malignant neoplasm of brain, unspecified: Secondary | ICD-10-CM | POA: Insufficient documentation

## 2018-07-22 ENCOUNTER — Encounter: Payer: Self-pay | Admitting: Allergy & Immunology

## 2018-07-22 ENCOUNTER — Other Ambulatory Visit: Payer: Self-pay | Admitting: Allergy & Immunology

## 2018-07-24 ENCOUNTER — Other Ambulatory Visit: Payer: Self-pay | Admitting: *Deleted

## 2018-07-24 MED ORDER — BUDESONIDE 180 MCG/ACT IN AEPB: INHALATION_SPRAY | RESPIRATORY_TRACT | 1 refills | Status: DC

## 2018-07-24 NOTE — Telephone Encounter (Signed)
OK refill on cetirizine.  Per chart, Return in about 6 months (around 10/04/2018).

## 2018-08-02 ENCOUNTER — Telehealth: Payer: Self-pay

## 2018-08-02 NOTE — Telephone Encounter (Signed)
Phone call placed to patient. Spoke with patient's husband to introduce Palliative care and to schedule visit with NP. Husband shared that patient is still at [facility] and unsure of discharge. Plan is to reconnect at a later date.

## 2018-08-07 ENCOUNTER — Telehealth: Payer: Self-pay

## 2018-08-07 NOTE — Telephone Encounter (Signed)
Received return call from patient's husband to schedule visit with Palliative Care. Visit scheduled for 08/09/18 between 12 and 12:30pm.

## 2018-08-07 NOTE — Telephone Encounter (Signed)
VM left for patient's husband to offer to schedule visit with Palliative Care. Contact information provided.

## 2018-08-08 DIAGNOSIS — L089 Local infection of the skin and subcutaneous tissue, unspecified: Secondary | ICD-10-CM | POA: Insufficient documentation

## 2018-08-08 DIAGNOSIS — T148XXA Other injury of unspecified body region, initial encounter: Secondary | ICD-10-CM

## 2018-08-09 ENCOUNTER — Other Ambulatory Visit: Payer: BLUE CROSS/BLUE SHIELD | Admitting: Nurse Practitioner

## 2018-08-09 DIAGNOSIS — G893 Neoplasm related pain (acute) (chronic): Secondary | ICD-10-CM

## 2018-08-09 DIAGNOSIS — R269 Unspecified abnormalities of gait and mobility: Secondary | ICD-10-CM

## 2018-08-09 DIAGNOSIS — Z515 Encounter for palliative care: Secondary | ICD-10-CM

## 2018-08-09 DIAGNOSIS — R1310 Dysphagia, unspecified: Secondary | ICD-10-CM

## 2018-08-09 DIAGNOSIS — H538 Other visual disturbances: Secondary | ICD-10-CM

## 2018-08-09 DIAGNOSIS — R471 Dysarthria and anarthria: Secondary | ICD-10-CM

## 2018-08-10 ENCOUNTER — Encounter: Payer: Self-pay | Admitting: Nurse Practitioner

## 2018-08-10 NOTE — Progress Notes (Signed)
PALLIATIVE CARE CONSULT VISIT   PATIENT NAME: [name] DOB: [birth year] MRN: [id number]  PRIMARY CARE PROVIDER:   Carol Ada, MD  REFERRING PROVIDER:  Carol Ada, MD [address], [place] [address]  RESPONSIBLE PARTY:   [name] (spouse) [phone] Cell [phone]   HISTORY OF PRESENT ILLNESS: Mrs. 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 Palliative Care was asked to help address goals of care.     ASSESSMENT/ PLAN/RECOMMENDATIONS  -Dysphagia/aspiration -Asp PNA -g-tube placement  -polyphagia -Patient now NPO -tube feedings and medications per g-tube -encouraged good oral care -patient reports "always feeling hungry" -recent increase of  Prednisone dose and is on olanzapine; both known to induce polyphagia  -No psychiatry notes in epic -consider GDR if not done in the past 6 months or has failed multiple GDR's in the past -consider -consider low dose topiramate (may help with mood d/o; is antiepileptic and used to treat migraines) may help to decrease feeling of hunger   -Generalized weakness;gait disorder; blurred vision, severe dysarthria 2/2 -Grade II astrocytoma of the brainstem and upper cervical spinal cord -s/p VP shunt -chronic pain of cervical neck -husband reports she can stand for a few seconds; patient uses wheelchair but is essentially bed-bound most of the time -continue current pain regimen which patient reports is effective -encouraged frequent reposition for pressure offloading   Chronic medical  problems: asthma, GERD, anxiety, bipolar d/o,sleep disturbance,OCD,chronic constipation, sleep, overactive bladder -continue current regimen   -ACP Patient is full code    I spent 45 minutes providing this consultation,  from 1:00 to 1:45. More than 50% of the time in this consultation was spent coordinating communication.   CODE STATUS: Full  PPS: 30% HOSPICE ELIGIBILITY/DIAGNOSIS: TBD  PAST MEDICAL HISTORY:  Past Medical History:  Diagnosis Date  . Anxiety   . Arthritis    arthritis in knees  . Asthma    asthma since a child. Well controlled  . Brain tumor ([place])   . 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 Cancer ([place])    grade 2 astrocytoma in brain has had chemo and radiation. surgery x4 2015  . Kidney stone   . Migraine   . Pneumonia    last time 2 yr ago  . Recurrent upper respiratory infection (URI)   . Urticaria     SOCIAL HX:  Social History   Tobacco Use  . Smoking status: Never Smoker  . Smokeless tobacco: Never Used  Substance Use Topics  . Alcohol use: No    Alcohol/week: 0.0 standard drinks    ALLERGIES:  Allergies  Allergen Reactions  . Penicillins Anaphylaxis    Has patient had a PCN reaction causing immediate rash, facial/tongue/throat swelling, SOB or lightheadedness with hypotension:  yes Has patient had a PCN reaction causing severe rash involving mucus membranes or skin necrosis:  no Has patient had a PCN reaction that required hospitalization: yes Has patient had a PCN reaction occurring within the last 10 years: no If all of the above answers are "NO", then may proceed with Cephalosporin use.   . Albuterol Other (See Comments)    Jittery   .  Codeine Hives  . Compazine [Prochlorperazine Edisylate]     Panic attacks  . Diclofenac Hives  . Midazolam Itching  . Betasept Surgical Scrub [Chlorhexidine Gluconate] Rash     PERTINENT MEDICATIONS:  Outpatient Encounter Medications as of 08/09/2018  Medication Sig  . albuterol (PROAIR HFA) 108 (90 Base)  MCG/ACT inhaler 4 puffs every 4-6 hours (Patient not taking: Reported on 06/05/2018)  . amphetamine-dextroamphetamine (ADDERALL XR) 10 MG 24 hr capsule Take 20 mg by mouth daily.  . budesonide (PULMICORT FLEXHALER) 180 MCG/ACT inhaler TAKE 2 PUFFS BY MOUTH TWICE A DAY  . budesonide (RHINOCORT AQUA) 32 MCG/ACT nasal spray Place 1 spray into both nostrils daily. (Patient taking differently: Place 1 spray into both nostrils at bedtime. )  . cetirizine (ZYRTEC) 10 MG tablet TAKE 1 TABLET BY MOUTH ONCE DAILY FOR RUNNY NOSE/ITCHING  . Cranberry 500 MG CAPS Take 500 mg by mouth at bedtime.   . diazepam (VALIUM) 5 MG tablet Take 5 mg by mouth daily as needed for anxiety (sleep).  . docusate sodium (COLACE) 100 MG capsule Take 100 mg by mouth at bedtime.  . Eszopiclone 3 MG TABS Take 3 mg by mouth daily at 10 pm. Reported on 01/23/2016  . HYDROmorphone (DILAUDID) 4 MG tablet Take 1-3 tablets every 6 hours as needed for pain (Patient taking differently: Take 4 mg by mouth See admin instructions. Take one tablet (4 mg) by mouth twice daily - 6am and 2pm)  . ibuprofen (ADVIL,MOTRIN) 200 MG tablet Take 400 mg by mouth every 6 (six) hours as needed (pain).  Marland Kitchen levalbuterol (XOPENEX HFA) 45 MCG/ACT inhaler USE 1 TO 2 INHALATIONS EVERY 4 HOURS AS NEEDED FOR WHEEZING (Patient taking differently: Inhale 2 puffs into the lungs every 4 (four) hours as needed for wheezing or shortness of breath. )  . MEGARED OMEGA-3 KRILL OIL 500 MG CAPS Take 500 mg by mouth at bedtime.  Marland Kitchen OLANZapine (ZYPREXA) 10 MG tablet Take 10 mg by mouth at bedtime.  Marland Kitchen omeprazole (PRILOSEC OTC) 20 MG tablet Take 20 mg by mouth at bedtime.  . promethazine (PHENERGAN) 25 MG tablet Take 25 mg by mouth daily as needed for nausea or vomiting.   . tamsulosin (FLOMAX) 0.4 MG CAPS capsule Take 1 capsule (0.4 mg total) by mouth daily after supper. (Patient not taking: Reported on 06/05/2018)  . Tapentadol HCl (NUCYNTA ER) 150 MG TB12 Take 150 mg by mouth every 12  (twelve) hours. 0600 and 1800   . temazepam (RESTORIL) 15 MG capsule Take 15 mg by mouth See admin instructions. Take one capsule (15 mg) by mouth in the middle of the night as needed for sleep   No facility-administered encounter medications on file as of 08/09/2018.     PHYSICAL EXAM:   General: NAD, well-developed Cardiovascular: regular rate and rhythm Pulmonary: clear ant fields Abdomen: soft, nontender, + bowel sounds; G-tube GU: no suprapubic tenderness Extremities: no edema, no joint deformities Skin: no rashes Neurological: Weakness but otherwise nonfocal  Stephanie G Martinique, NP

## 2018-09-07 ENCOUNTER — Telehealth: Payer: Self-pay

## 2018-09-07 NOTE — Telephone Encounter (Signed)
Received VM from case manager at [facility] regarding patient's recent hospitalization and d/c home on 09/06/18. Phone call placed to patient's husband to check in and offer follow up visit with NP. VM left

## 2018-10-23 ENCOUNTER — Telehealth: Payer: Self-pay | Admitting: *Deleted

## 2018-10-23 NOTE — Telephone Encounter (Signed)
Received PA for Pulmicort Flexhaler 180 faxed back refused to complete PA due to patient needing an office visit faxed to pharmacy [phone] (Fax) [facility] [address]

## 2018-10-24 ENCOUNTER — Telehealth: Payer: Self-pay | Admitting: Allergy & Immunology

## 2018-10-24 ENCOUNTER — Other Ambulatory Visit: Payer: Self-pay | Admitting: *Deleted

## 2018-10-24 NOTE — Telephone Encounter (Signed)
Pt husband called and needed a refill on her pulmicort flexhaler  And was told she needed to be seen and he said that she was in a wheelchair and very hard to get in and out of. [facility] [address] [phone]. Last seen 04/03/2018.

## 2018-10-24 NOTE — Telephone Encounter (Signed)
PA has been submitted and is pending approval for Pulmicort.

## 2018-10-24 NOTE — Telephone Encounter (Signed)
Called patient's husband and left a voicemail asking to return call to discuss care for [name] and insure that insurance is still the same in order to complete PA for Pulmicort.

## 2018-10-27 NOTE — Telephone Encounter (Signed)
PA still pending.  

## 2018-10-30 NOTE — Telephone Encounter (Signed)
PA pending

## 2018-10-31 NOTE — Telephone Encounter (Signed)
Called and talked with the patient's husband. Per Eustaquio Maize will be providing a sample of Pulmicort 180 for the husband to come and pick up until thinks are resolved with the pending PA.

## 2018-11-17 ENCOUNTER — Telehealth: Payer: Self-pay

## 2018-11-17 ENCOUNTER — Other Ambulatory Visit: Payer: Self-pay | Admitting: *Deleted

## 2018-11-17 DIAGNOSIS — Z789 Other specified health status: Secondary | ICD-10-CM | POA: Insufficient documentation

## 2018-11-17 DIAGNOSIS — R1313 Dysphagia, pharyngeal phase: Secondary | ICD-10-CM | POA: Insufficient documentation

## 2018-11-17 MED ORDER — BUDESONIDE 180 MCG/ACT IN AEPB: INHALATION_SPRAY | RESPIRATORY_TRACT | 1 refills | Status: AC

## 2018-11-17 NOTE — Telephone Encounter (Signed)
Prior Auth initiated through [email or web address]. Key: [id number] Please advise on refills

## 2018-11-17 NOTE — Telephone Encounter (Signed)
We will just give her samples from our supply closet until hospice takes over her medications.  Discussed with Nancee Liter and Larrie Kass.   Salvatore Marvel, MD [facility]

## 2018-11-20 NOTE — Telephone Encounter (Signed)
PA for Pulmicort was denied via covermymeds. Samples need to be provided

## 2018-11-22 NOTE — Telephone Encounter (Signed)
Spoke with care taker and will have samples for them to collect up front.

## 2018-12-12 ENCOUNTER — Telehealth: Payer: Self-pay | Admitting: Internal Medicine

## 2018-12-12 NOTE — Telephone Encounter (Signed)
12/12/2018 9am: TC PCG spouse [name]:  [name] mentioned that his wife, per her wishes, is currently receiving palliative chemo. They have a f/u OV with their oncologist 12/25/18. Subsequent to that visit, [name] anticipates a restaging study, such as an MRI, to f/u on degree of tumor response to therapy. [name] anticipates team will make further plans pending that eval. [name] is definitely open to hospice services should further chemo be off the table, and shares that he has the full support of the oncology team.  I touched base with our ([facility]) grief counseling staff, and Kennon Holter will facilitate access to an in-home grief counselor, and facilitate patient's children (ages 28 and 59) to participate in kids path. I anticipate that patient will be transitioning onto Hospice in the future, and thereafter will have good access to SW counseling support after that point.   [name] seems to be feeling the weight of this last year's medical schedules, therapies, emotional toll, quite heavily. He's trying to pull it all together, as you would imagine. On M-W-F he rushes home from work to administer his wife her medications, and assist with toileting. The in-laws take over daily care in shifts, with [name] covering nights and weekends.  I scheduled myself to visit this Friday 12/14/2008 12:30pm.  Violeta Gelinas NP-C

## 2018-12-15 ENCOUNTER — Other Ambulatory Visit: Payer: BLUE CROSS/BLUE SHIELD | Admitting: Internal Medicine

## 2018-12-15 ENCOUNTER — Other Ambulatory Visit: Payer: Self-pay

## 2018-12-15 DIAGNOSIS — Z515 Encounter for palliative care: Secondary | ICD-10-CM

## 2018-12-15 NOTE — Progress Notes (Signed)
December 15, 2018 [facility] Palliative Care Consult Note Telephone: [phone]  Fax: [phone]  PATIENT NAME: [name] DOB: Mar 12, 1976 MRN: [id number]  PRIMARY CARE PROVIDER:   Carol Ada, MD  REFERRING PROVIDER:  Carol Ada, [address] [place], [place] [address] Dr. Erin Fulling [facility] [phone]  Agencies: [place]:  (spouse) Gerald Stabs [phone], (M) [phone]  ASSESSMENT/RECOMMENDATONS:      1. Caregiver supports: This couple report their most immediate need is for in-home counseling, to assist in coping with this serious illness. Gerald Stabs is open in discussing the difficulties in balancing work with his wife's care. Patient's mom Daine Floras (who is a retired Marine scientist) and dad Elta Guadeloupe provide patient's care M-F 8am to 5:50pm. Husband Gerald Stabs covers all other times, including stopping by the home at lunch time M-W-F. Gerald Stabs mentions that the grounds are totally taken care of by the home owner's association, and that he avoids meal prep within the home in consideration of patient's inability to eat, with appetite stimulation form food odors. This is a lot on [place], but he feels it's his duty as a husband to providefor his wife's personal care. He doesn't feel there is an option for assistance from that quarter. Patient has two children, a son who is celebrating his 23th birthday on March 15th, and a daughter age 31, who I believe is in college.  -appointment scheduled for Wednesday March 18th with Josephina Gip [facility].  -I did e-mail Anderson Malta some patient information material regarding mood changes, maintaining a sense of hope, and setting goals in the face of serious illness. I also offered to drop off some additional patient information if they would like to explore this further.   2.Goals of Care: Patient is currently receiving palliative  chemo. She has a f/u OV with their oncologist 12/25/18. Subsequent to that visit, she anticipates a restaging study to f/u on degree of tumor response to therapy. Patient's husband Gerald Stabs mentions they will make further plans pending that eval. Gerald Stabs is definitely open to hospice services should further chemo be off the table, and shares that he has the full support of the oncology team. Gerald Stabs is careful to emphasize that the patient is the primary decision maker as regards the course she wishes to pursue. At this point, [place] would wish to continue aggressive intervention, with an eye to the benefits vs. burdens.   3. Advanced Care directives: DNR in the home. Gerald Stabs has a copy in his safe. I mentioned he may wish to place on the fridge or in a more accessible spot which is known to her care givers, in case of immediate need. I provided an additional copy they can take with them, if they travel outside of the home. I discussed the MOST form in brief, and left family with some companion informational material for their review.   4. Occipital area pain; positional r/t pressure/weight of her head on her neck. Relieved/lessened when laying flat. Can be severe when HOB elevated to any extent. Patient feels adequately managed with Xtampza ER 27 mg bid, and dilaudid 4 mg bid. She spends the majority of her day supine. Opioid induced constipation not too much of a problem. Baseline bowel movement qod. Takes prn MiraLax about qod and rarely Senakot if needed. Occasional bottom discomfort relieved with repositioning.  -  Consider home PT consult for neck brace. Might help relieve  neck pressure.  5. Nausea adequately managed with Zofran , olanzapine, dronabinol, prn Ativan, and prn phenergan. She misses being able to eat and drink. Family try to keep food odors to a minimum.  6. Insomnia: managed with Lunesta and evening dose Ativan  7. Constant sensation of thirst/dry mouth. Appears adequate amount of hydration  with scheduled feedings (6 osmolite /day)and greater than 1000 ml/d free water via GI tube. Uses Xyli melts; each tab lasts about 3-4 hours. Keeps one tucked in the side of her mouth constantly. She has pooling of oral pharyngeal secretions that clear with cough. She is able to swallow her own saliva. She totally rejects the idea of chewing some food then spitting it out as this increased her salivation.  -We discussed the potential use of a bedside suction device should her secretions become increasingly difficult for her to handle.   8. Functional decline r/t medical illness: Essentially bed bound. Headache ppt with HOB elevation discourages sitting up. She has marked LLE weakness and foot drop such that she is not able to lift or weight bear on that leg. She is a heavy 2 person transfer. She is dependent for hygiene and dressing. She is continent of bowel and bladder; husband transfers her via wheelchair to bathroom. She doesn't want a bedside commode. She sleeps in a regular bed; not ready for a hospital bed, -consider a Hoyer lift; I'll run this idea by the family.   I spent 90 minutes providing this consultation,  from 12:30pm to 2pm. More than 50% of the time in this consultation was spent coordinating communication, interview of patient and family, reconciling medication list, and charting.   HISTORY OF PRESENT ILLNESS:  TIANDRA SWOVELAND is a 43 y.o. 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 Initial XRT therapy and chemo agent temozolomide; subsequent single cycle CCNU, then Avastin, currently carboplatin; Portacath in place), hydrocephalus and pseudomeningocele (Feb 2015, managed with programmable R frontal ventricular to peritoneal shunt), aspiration pneumonias (2019) 2/2 brainstem induced neurologic dysfunction (now NPO; GJ tube Oct 2019). H/O asthma, GERD, Chiari malformation, cervical facet arthropathy, vomiting d/t chemotherapy, neoplasm related  pain, and LLE weakness/foot drop r/t brain cancer, and generalized weakness/fatigue.  CODE STATUS: DNR   PPS: 30%  HOSPICE ELIGIBILITY/DIAGNOSIS: yes, pending discontinuation of aggressive thearpy / astrocytoma of the brain stem and upper cervical spine  PAST MEDICAL HISTORY:  Past Medical History:  Diagnosis Date  . Anxiety   . Arthritis    arthritis in knees  . Asthma    asthma since a child. Well controlled  . Brain tumor ([place])   . 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 Cancer ([place])    grade 2 astrocytoma in brain has had chemo and radiation. surgery x4 2015  . Kidney stone   . Migraine   . Pneumonia    last time 2 yr ago  . Recurrent upper respiratory infection (URI)   . Urticaria     SOCIAL HX:  Social History   Tobacco Use  . Smoking status: Never Smoker  . Smokeless tobacco: Never Used  Substance Use Topics  . Alcohol use: No    Alcohol/week: 0.0 standard drinks    ALLERGIES:  Allergies  Allergen Reactions  . Penicillins Anaphylaxis    Has patient had a PCN reaction causing immediate rash, facial/tongue/throat swelling, SOB or lightheadedness with hypotension:  yes Has patient had a PCN reaction causing severe rash involving mucus membranes or skin necrosis:  no Has patient had a PCN reaction that required hospitalization: yes Has patient had a PCN reaction occurring within the last 10 years: no If all of the above answers are "NO", then may proceed with Cephalosporin use.   . Albuterol Other (See Comments)    Jittery   . Codeine Hives  . Compazine [Prochlorperazine Edisylate]     Panic attacks  . Diclofenac Hives  . Midazolam Itching  . Betasept Surgical Scrub [Chlorhexidine Gluconate] Rash     PERTINENT MEDICATIONS:  Outpatient Encounter Medications as of 12/15/2018  Medication Sig  . albuterol (PROAIR HFA) 108 (90 Base) MCG/ACT inhaler 4 puffs every 4-6 hours (Patient not taking: Reported on 06/05/2018)  . amphetamine-dextroamphetamine (ADDERALL XR) 10 MG 24  hr capsule Take 20 mg by mouth daily.  . budesonide (PULMICORT FLEXHALER) 180 MCG/ACT inhaler TAKE 2 PUFFS BY MOUTH TWICE A DAY  . budesonide (RHINOCORT AQUA) 32 MCG/ACT nasal spray Place 1 spray into both nostrils daily. (Patient taking differently: Place 1 spray into both nostrils at bedtime. )  . cetirizine (ZYRTEC) 10 MG tablet TAKE 1 TABLET BY MOUTH ONCE DAILY FOR RUNNY NOSE/ITCHING  . Cranberry 500 MG CAPS Take 500 mg by mouth at bedtime.   . diazepam (VALIUM) 5 MG tablet Take 5 mg by mouth daily as needed for anxiety (sleep).  . docusate sodium (COLACE) 100 MG capsule Take 100 mg by mouth at bedtime.  . Eszopiclone 3 MG TABS Take 3 mg by mouth daily at 10 pm. Reported on 01/23/2016  . HYDROmorphone (DILAUDID) 4 MG tablet Take 1-3 tablets every 6 hours as needed for pain (Patient taking differently: Take 4 mg by mouth See admin instructions. Take one tablet (4 mg) by mouth twice daily - 6am and 2pm)  . ibuprofen (ADVIL,MOTRIN) 200 MG tablet Take 400 mg by mouth every 6 (six) hours as needed (pain).  Marland Kitchen levalbuterol (XOPENEX HFA) 45 MCG/ACT inhaler USE 1 TO 2 INHALATIONS EVERY 4 HOURS AS NEEDED FOR WHEEZING (Patient taking differently: Inhale 2 puffs into the lungs every 4 (four) hours as needed for wheezing or shortness of breath. )  . MEGARED OMEGA-3 KRILL OIL 500 MG CAPS Take 500 mg by mouth at bedtime.  Marland Kitchen OLANZapine (ZYPREXA) 10 MG tablet Take 10 mg by mouth at bedtime.  Marland Kitchen omeprazole (PRILOSEC OTC) 20 MG tablet Take 20 mg by mouth at bedtime.  . promethazine (PHENERGAN) 25 MG tablet Take 25 mg by mouth daily as needed for nausea or vomiting.   . tamsulosin (FLOMAX) 0.4 MG CAPS capsule Take 1 capsule (0.4 mg total) by mouth daily after supper. (Patient not taking: Reported on 06/05/2018)  . Tapentadol HCl (NUCYNTA ER) 150 MG TB12 Take 150 mg by mouth every 12 (twelve) hours. 0600 and 1800   . temazepam (RESTORIL) 15 MG capsule Take 15 mg by mouth See admin instructions. Take one capsule (15 mg)  by mouth in the middle of the night as needed for sleep   No facility-administered encounter medications on file as of 12/15/2018.     PHYSICAL EXAM:  Pleasantly conversant, well nourished Caucasian female almost supine in couch recliner. Her mom Susie and Husband Gerald Stabs are in attendance.  Limited physical exam:  Pulmonary: occasional audible secretions cleared with a weak cough Abdomen: G tube in place RUQ; small dry gauze intact Extremities: no edema, no joint deformities.  Skin: no rashes Neurological: Generalized weakness. Unable to lift LLE; L foot drop  Julianne Handler, NP

## 2018-12-16 ENCOUNTER — Encounter: Payer: Self-pay | Admitting: Internal Medicine

## 2018-12-18 ENCOUNTER — Telehealth: Payer: Self-pay | Admitting: Internal Medicine

## 2018-12-18 NOTE — Telephone Encounter (Signed)
2:45pm: TC to spouse Gerald Stabs to let him know that I e-mailed some patient information material to patient's e-mail address, and perhaps to look out for. Also asked about possible use of a Hoyer lift. Gerald Stabs mentioned he had thought about that and feels not currently needed, but might tap into that in the future. I also asked about neck brace and if it might help with neck pain? Gerald Stabs said this had been investigated in the past, and was not helpful due to patient's anatomy. Violeta Gelinas NP-C [phone]

## 2018-12-19 NOTE — Telephone Encounter (Signed)
Error Violeta Gelinas NP-C

## 2019-01-03 ENCOUNTER — Telehealth: Payer: Self-pay

## 2019-01-03 NOTE — Telephone Encounter (Signed)
Prior authorization for levalbuterol hfa inhaler has been approved and sent to pharmacy. Effective from 01/02/2019 through 12/31/2021.

## 2019-01-05 ENCOUNTER — Other Ambulatory Visit: Payer: Self-pay

## 2019-01-05 ENCOUNTER — Other Ambulatory Visit: Payer: BLUE CROSS/BLUE SHIELD | Admitting: Internal Medicine

## 2019-01-05 DIAGNOSIS — Z515 Encounter for palliative care: Secondary | ICD-10-CM

## 2019-01-05 NOTE — Progress Notes (Addendum)
April 3rd, 2020 [facility] Consult Note Telephone: [phone]  Fax: [phone]  * Due to the COVID-19 crisis, this visit was done via telehealth and it was initiated and consented to, by the patient and family.   PATIENT NAME: [name] DOB: [birth year] MRN: [id number]  PRIMARY CARE PROVIDER:   Carol Ada, MD  REFERRING PROVIDER:  Carol Ada, [place], [place] [address] Dr. Erin Fulling [facility] [phone]  Agencies: [place]:  (spouse) Gerald Stabs [phone], [phone]  ASSESSMENT / RECOMMENDATONS:     Email: [email or web address] 1. Dyspnea: This is a F/U telehealth visit, from earlier telephone communication (01/04/2019 7pm), initiated by PCG husband Gerald Stabs. Gerald Stabs noted patient had rapid and decreased sats 82-84% with reclining, 90% sitting up. Patient with increased poster pharynx secretions which she chronically has difficulty in coughing up. Gerald Stabs had contacted our after hours triage nurse, who guided Gerald Stabs in further assessment. Patient denied dyspnea, though felt anxious. Her RR was 48. She refused transportation for ER eval. She felt better after getting a Xopenex inhalation treatment about 2 hours earlier (ordered 2 puffs q4hrs). She had prn Dilaudid available (57m q4h prn) which she takes about bid. At that time, I asked CGerald Stabsto repeat Xopenex inhaler, give 1 dose Dilaudid, bath patients face with some cool water and use a fan to breeze across her face. This am I contacted office of Dr. LMaylon Peppers(oncology) requesting orders for home oxygen therapy and bedside suction (ABonneaucurrent provider) and scheduled a telemedicine call for today at 6pm. 3pm today: [facility] CHarmony([phone]. Dr. LMaylon Peppersis agreeable to ordering above.  2. Patient supports:  Patient states has met twice with RJosephina Gipfrom [facility], and feels sessions are going well.    2.Goals of Care: Patient is currently receiving palliative chemo. She had a f/u OV with their oncologist 12/25/18 and CGerald Stabsstates there has not been progression in the disease.  JMahsacontinues to wish continued palliative treatment.   3. Advanced Care directives: DNR is in the home.   4. Occipital area pain; positional r/t pressure/weight of her head on her neck. Relieved/lessened when laying flat. Continues adequate pain control on Xtampza ER 27 mg bid, and dilaudid 4 mg bid.   5. Nausea adequately managed with Zofran , olanzapine, dronabinol, prn Ativan, and prn phenergan. Continues PEG tube feedings.  6. Insomnia: managed with Lunesta and evening dose Ativan  7. Constant sensation of thirst/dry mouth. Continues Xyli melts.  8. Functional decline r/t medical illness: Continues essentially bed bound. Headache ppt with HOB elevation discourages sitting up. She has marked LLE weakness and foot drop such that she is not able to lift or weight bear on that leg. She is a heavy 2 person transfer. She is dependent for hygiene and dressing. She is continent of bowel and bladder; husband transfers her via wheelchair to bathroom. She doesn't want a bedside commode. She sleeps in a regular bed; not ready for a hospital bed. Family aware of [facility] lift availability should they wish.  9. F/U: patient and family have my contact information. The wish to schedule visits on a prn basis.  I spent 60 minutes providing this consultation,  from 12:30pm to 2pm. More than 50% of the time in this consultation was spent coordinating communication, interview of  patient and family, reconciling medication list, and charting.   HISTORY OF PRESENT ILLNESS:  [name] is a 43 y.o. 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 H/O asthma, GERD, Chiari malformation, cervical facet arthropathy, vomiting d/t chemotherapy, neoplasm related pain, and LLE weakness/foot drop r/t brain cancer, and generalized weakness/fatigue. This is a F/U Palliative Care visit from 12/15/2018.  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 surgery x4 2015   Kidney stone    Migraine    Pneumonia    last time 2 yr ago   Recurrent upper respiratory infection (URI)    Urticaria     SOCIAL HX:  Social History   Tobacco Use   Smoking status: Never Smoker   Smokeless tobacco: Never Used  Substance Use Topics   Alcohol use: No    Alcohol/week: 0.0 standard drinks    ALLERGIES:  Allergies  Allergen Reactions   Penicillins Anaphylaxis    Has patient had a PCN reaction causing immediate rash, facial/tongue/throat swelling, SOB or lightheadedness with hypotension:  yes Has patient had a PCN reaction causing severe rash involving mucus membranes or skin necrosis:  no Has patient had a PCN reaction that required hospitalization: yes Has patient had a PCN reaction occurring within the last 10 years: no If all of the above answers are "NO", then may proceed with Cephalosporin  use.    Albuterol Other (See Comments)    Jittery    Codeine Hives   Compazine [Prochlorperazine Edisylate]     Panic attacks   Diclofenac Hives   Midazolam Itching   Betasept Surgical Scrub [Chlorhexidine Gluconate] Rash     PERTINENT MEDICATIONS:  Outpatient Encounter Medications as of 01/05/2019  Medication Sig   albuterol (PROAIR HFA) 108 (90 Base) MCG/ACT inhaler 4 puffs every 4-6 hours (Patient not taking: Reported on 06/05/2018)   amphetamine-dextroamphetamine (ADDERALL XR) 10 MG 24 hr capsule Take 20 mg by mouth daily.   budesonide (PULMICORT FLEXHALER) 180 MCG/ACT inhaler TAKE 2 PUFFS BY MOUTH TWICE A DAY   budesonide (RHINOCORT AQUA) 32 MCG/ACT nasal spray Place 1 spray into both nostrils daily. (Patient taking differently: Place 1 spray into both nostrils at bedtime. )   cetirizine (ZYRTEC) 10 MG tablet TAKE 1 TABLET BY MOUTH ONCE DAILY FOR RUNNY NOSE/ITCHING   Cranberry 500 MG CAPS Take 500 mg by mouth at bedtime.    diazepam (VALIUM) 5 MG tablet Take 5 mg by mouth every 12 (twelve) hours as needed for anxiety (sleep).    docusate sodium (COLACE) 100 MG capsule Take 100 mg by mouth at bedtime.   dronabinol (MARINOL) 2.5 MG capsule Take 2.5 mg by mouth every 12 (twelve) hours as needed.  Eszopiclone 3 MG TABS Take 3 mg by mouth daily at 10 pm. Reported on 01/23/2016   HYDROmorphone (DILAUDID) 4 MG tablet Take 1-3 tablets every 6 hours as needed for pain (Patient taking differently: Take 4 mg by mouth See admin instructions. Take one tablet (4 mg) by mouth twice daily - 6am and 2pm)   ibuprofen (ADVIL,MOTRIN) 200 MG tablet Take 400 mg by mouth every 6 (six) hours as needed (pain).   lansoprazole (PREVACID) 15 MG capsule Take 15 mg by mouth 2 (two) times daily before a meal.   levalbuterol (XOPENEX HFA) 45 MCG/ACT inhaler USE 1 TO 2 INHALATIONS EVERY 4 HOURS AS NEEDED FOR WHEEZING (Patient taking differently: Inhale 2 puffs into the lungs every 4 (four) hours as  needed for wheezing or shortness of breath. )   LORazepam (ATIVAN) 0.5 MG tablet Take 0.5 mg by mouth every 6 (six) hours as needed for anxiety.   MEGARED OMEGA-3 KRILL OIL 500 MG CAPS Take 500 mg by mouth at bedtime.   OLANZapine (ZYPREXA) 10 MG tablet Take 10 mg by mouth at bedtime.   omeprazole (PRILOSEC OTC) 20 MG tablet Take 20 mg by mouth at bedtime.   oxyCODONE ER (XTAMPZA ER) 27 MG C12A Take 27 mg by mouth 2 (two) times daily.   polyethylene glycol (MIRALAX / GLYCOLAX) packet Take 17 g by mouth 2 (two) times daily as needed.   predniSONE (DELTASONE) 20 MG tablet Take 20 mg by mouth 2 (two) times daily with a meal.   promethazine (PHENERGAN) 25 MG tablet Take 25 mg by mouth every 6 (six) hours as needed for nausea or vomiting.    senna-docusate (SENOKOT-S) 8.6-50 MG tablet Take 1 tablet by mouth 2 (two) times daily as needed for mild constipation.   tamsulosin (FLOMAX) 0.4 MG CAPS capsule Take 1 capsule (0.4 mg total) by mouth daily after supper. (Patient not taking: Reported on 06/05/2018)   Tapentadol HCl (NUCYNTA ER) 150 MG TB12 Take 150 mg by mouth every 12 (twelve) hours. 0600 and 1800    temazepam (RESTORIL) 15 MG capsule Take 15 mg by mouth See admin instructions. Take one capsule (15 mg) by mouth in the middle of the night as needed for sleep   No facility-administered encounter medications on file as of 01/05/2019.     PHYSICAL EXAM:   General: Fatigued appearing, laying supine on couch. She is alert and conversant, but her speech is difficult to understand. Husband Gerald Stabs helps to clarify Pulmonary: RR 14; breathing comfortably  Skin: exposed skin without rashes Neurological: generalized weakness  Julianne Handler, NP

## 2019-01-06 ENCOUNTER — Encounter: Payer: Self-pay | Admitting: Internal Medicine

## 2019-01-11 ENCOUNTER — Telehealth: Payer: Self-pay | Admitting: Internal Medicine

## 2019-01-11 NOTE — Telephone Encounter (Signed)
10:20am:  TC from the office of Dr. Vedia Coffer [phone]). Spoke to office staff member Caroll Rancher, who was sorting out necessary information to obtain bedside suction and continuous flow / portable oxygen, from [facility]. [employer] needs a hard script written for "Continuous flow oxygen at 2 LPM [place]. Will also need portable tanks for travel outside the home for doctor's visits. Indications: documented resting oxygen saturations 82-84%. Unable to obtain ambulatory oxygen saturations as patient is bed bound."  I will fax my Telehealth visit to [facility] [phone]) to satisfy their need for documentation for bedside suction and oxygen therapy.I will attempt to talk to an [employer] representative to see if any further documentation/requirements needed. Will call PCG spouse [name] to update.  Violeta Gelinas NP-C [phone]

## 2019-01-18 ENCOUNTER — Telehealth: Payer: Self-pay | Admitting: Internal Medicine

## 2019-01-18 NOTE — Telephone Encounter (Signed)
4pm: I contacted [place] (formarly [place]) to check on status of DME oxygen compressor and portable suction equipment. [employer] representative reports that O2 compressor was delivered to the home on 01/16/2019, but that they did not receive an order from Dr. Lorette Ang office for the bedside suction. I left a message for spouse Gerald Stabs to call me, to verify that they still would like to have this suction equipment in the home. If so, I will contact Dr. Lorette Ang office [phone]) to request that they fax the order over (bedside suction with tubing and yanker, and I will fax (to [phone]) my note from 01/06/19 to [employer], to validated the necessity.   Violeta Gelinas NP-C [phone]

## 2019-01-22 ENCOUNTER — Telehealth: Payer: Self-pay | Admitting: Internal Medicine

## 2019-01-22 NOTE — Telephone Encounter (Signed)
12:20pm: TC to patient's spouse [name], to double check that patient still has the need for a bed side suction. [name] reported that [name] did have some difficulty with coughing up secretions over the weekend; that it was a struggle for her to finally clear her airway. I contacted Dr. Lorette Ang office and requested that they fax an order for bedside suction with tubing and yanker, to [facility] (fax # [phone]) Attention Intake Department. I faxed, to [place], my note from April 3rd as supporting evidence for equipment need.  Christy Gelinas NP-C [phone]

## 2019-02-06 ENCOUNTER — Telehealth: Payer: Self-pay

## 2019-02-06 NOTE — Telephone Encounter (Signed)
Spoke with husband [name] and advised him we are out of samples and will need to find another way of providing medication for patient since they claim this works but insurance states it's not under formulary. Patient has an appt to see Dr. Darnell Level tomorrow for a webex visit at 11:30Am

## 2019-02-06 NOTE — Telephone Encounter (Signed)
Patients husband is calling to see if the patient can get more samples of pulmicort. She was given samples because [employer] dropped Pulmicort off of the formulary. The husband is also wondering if maybe she needs a visit to regroup with Dr Ernst Bowler.   Please Advise

## 2019-02-07 ENCOUNTER — Other Ambulatory Visit: Payer: Self-pay

## 2019-02-07 ENCOUNTER — Ambulatory Visit (INDEPENDENT_AMBULATORY_CARE_PROVIDER_SITE_OTHER): Payer: BLUE CROSS/BLUE SHIELD | Admitting: Allergy & Immunology

## 2019-02-07 ENCOUNTER — Encounter: Payer: Self-pay | Admitting: Allergy & Immunology

## 2019-02-07 DIAGNOSIS — J302 Other seasonal allergic rhinitis: Secondary | ICD-10-CM | POA: Diagnosis not present

## 2019-02-07 DIAGNOSIS — J4540 Moderate persistent asthma, uncomplicated: Secondary | ICD-10-CM

## 2019-02-07 DIAGNOSIS — J3089 Other allergic rhinitis: Secondary | ICD-10-CM

## 2019-02-07 MED ORDER — LEVALBUTEROL TARTRATE 45 MCG/ACT IN AERO: 2.0000 | INHALATION_SPRAY | RESPIRATORY_TRACT | 5 refills | Status: AC | PRN

## 2019-02-07 MED ORDER — MOMETASONE FUROATE 220 MCG/INH IN AEPB: 2.0000 | INHALATION_SPRAY | Freq: Every day | RESPIRATORY_TRACT | 5 refills | Status: DC

## 2019-02-07 NOTE — Progress Notes (Signed)
RE: [name] MRN: [id number] DOB: 1976/08/13 Date of Telemedicine Visit: 02/07/2019  Referring provider: Carol Ada, MD Primary care provider: Carol Ada, MD  Chief Complaint: Asthma (stable O2 being used several days)   Telemedicine Follow Up Visit via Telephone: I connected with [name] for a follow up on 02/07/19 by telephone and verified that I am speaking with the correct person using two identifiers.   I discussed the limitations, risks, security and privacy concerns of performing an evaluation and management service by telephone and the availability of in person appointments. I also discussed with the patient that there may be a patient responsible charge related to this service. The patient expressed understanding and agreed to proceed.  Patient is at home accompanied by her husband who provided/contributed to the history.  Provider is at the office.  Visit start time: 11:30 AM Visit end time: 11:45 AM Insurance consent/check in by: [place] consent and medical assistant/nurse: Garlon Hatchet  History of Present Illness:  She is a 43 y.o. female, who is being followed for persistent asthma as well as chronic rhinitis. Her previous allergy office visit was in July 2019 with Dr. Ernst Bowler.  At the last visit, her spirometry looks stable.  We did not make any medication changes.  We continue Pulmicort 180 mcg 2 puffs twice daily with albuterol as needed.  For her rhinitis we continue Nasacort as well as cetirizine and nasal saline rinses.  At the last visit, she was already on hospice for recurrence of her astrocytoma.  In the interim, she has been receiving hospice services. She is on comfort care and is two pain medications daily, including Dilaudid BID. She remains on the Pulmicort, which we have bene supplying via samples. However, we do not have any samples of this at this time. Apparently there was a change in their insurance coverage. Her husband works  with [employer] and there were some changes in their insurance plans. Regardless, Pulmicort is no longer covered and this is what Ader has been stable on for years.  They have never tried using Asmanex [place] and we did confirm that this was covered on her currnent plan. We have a sample of each of these in the office in [place], so we will drop these samples off at their house. She does not have a nebulizer at all at her home and has never been on nebulized Pulmicort in the past.   Otherwise, there have been no changes to her past medical history, surgical history, family history, or social history.  Assessment and Plan:  Sherly is a 43 y.o. 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 Chase Caller  History of allergen immunotherapy systemic reactions (including one biphasic)  History of astrocytoma s/p surgeries and radiation - now terminal on hospice    1. Mild persistent asthma, uncomplicated - We have one sample of Pulmicort and will provide that to the patient today. - We are sending in Asmanex 247mcg two puffs twice daily (samples of this provided today as well) - Daily controller medication(s): Pulmicort 186mcg two puffs twice daily - Rescue medications: ProAir 4 puffs every 4-6 hours as needed - Changes during respiratory infections or worsening symptoms: increase Pulmicort 169mcg to 4 puffs twice daily for TWO WEEKS. - Asthma control goals:  * Full participation  in all desired activities (may need albuterol before activity) * Albuterol use two time or less a week on average (not counting use with activity) * Cough interfering with sleep two time or less a month * Oral steroids no more than once a year * No hospitalizations   2. Chronic rhinitis - Continue with nasal saline rinses. - Continue with Nasacort 1-2 sprays per nostril daily as needed. - Continue with Zyrtec (cetirizine) 10mg  daily as needed.    3. Return if symptoms worsen or fail to improve.     Diagnostics: None.  Medication List:  Current Outpatient Medications  Medication Sig Dispense Refill  . amphetamine-dextroamphetamine (ADDERALL XR) 10 MG 24 hr capsule Take 20 mg by mouth daily.    . budesonide (PULMICORT FLEXHALER) 180 MCG/ACT inhaler TAKE 2 PUFFS BY MOUTH TWICE A DAY 3 each 1  . budesonide (RHINOCORT AQUA) 32 MCG/ACT nasal spray Place 1 spray into both nostrils daily. (Patient taking differently: Place 1 spray into both nostrils at bedtime. ) 1 Bottle 5  . cetirizine (ZYRTEC) 10 MG tablet TAKE 1 TABLET BY MOUTH ONCE DAILY FOR RUNNY NOSE/ITCHING 90 tablet 0  . Cranberry 500 MG CAPS Take 500 mg by mouth at bedtime.     . diazepam (VALIUM) 5 MG tablet Take 5 mg by mouth every 12 (twelve) hours as needed for anxiety (sleep).     . docusate sodium (COLACE) 100 MG capsule Take 100 mg by mouth at bedtime.    . dronabinol (MARINOL) 2.5 MG capsule Take 2.5 mg by mouth every 12 (twelve) hours as needed.    . Eszopiclone 3 MG TABS Take 3 mg by mouth daily at 10 pm. Reported on 01/23/2016    . HYDROmorphone (DILAUDID) 4 MG tablet Take 1-3 tablets every 6 hours as needed for pain (Patient taking differently: Take 4 mg by mouth See admin instructions. Take one tablet (4 mg) by mouth twice daily - 6am and 2pm) 40 tablet 0  . ibuprofen (ADVIL,MOTRIN) 200 MG tablet Take 400 mg by mouth every 6 (six) hours as needed (pain).    Marland Kitchen lansoprazole (PREVACID) 15 MG capsule Take 15 mg by mouth 2 (two) times daily before a meal.    . levalbuterol (XOPENEX HFA) 45 MCG/ACT inhaler Inhale 2 puffs into the lungs every 4 (four) hours as needed for wheezing or shortness of breath. 15 g 5  . LORazepam (ATIVAN) 0.5 MG tablet Take 0.5 mg by mouth every 6 (six) hours as  needed for anxiety.    Marland Kitchen MEGARED OMEGA-3 KRILL OIL 500 MG CAPS Take 500 mg by mouth at bedtime.    Marland Kitchen OLANZapine (ZYPREXA) 10 MG tablet Take 10 mg by mouth at bedtime.  3  . omeprazole (PRILOSEC OTC) 20 MG tablet Take 20 mg by mouth at bedtime.    Marland Kitchen oxyCODONE ER (XTAMPZA ER) 27 MG C12A Take 27 mg by mouth 2 (two) times daily.    . polyethylene glycol (MIRALAX / GLYCOLAX) packet Take 17 g by mouth 2 (two) times daily as needed.    . predniSONE (DELTASONE) 20 MG tablet Take 20 mg by mouth 2 (two) times daily with a meal.    . promethazine (PHENERGAN) 25 MG tablet Take 25 mg by mouth every 6 (six) hours as needed for nausea or vomiting.     . senna-docusate (SENOKOT-S) 8.6-50 MG tablet Take 1 tablet by mouth 2 (two) times daily as needed for mild constipation.    . tamsulosin (FLOMAX) 0.4 MG CAPS  capsule Take 1 capsule (0.4 mg total) by mouth daily after supper. 30 capsule 0  . Tapentadol HCl (NUCYNTA ER) 150 MG TB12 Take 150 mg by mouth every 12 (twelve) hours. 0600 and 1800     . temazepam (RESTORIL) 15 MG capsule Take 15 mg by mouth See admin instructions. Take one capsule (15 mg) by mouth in the middle of the night as needed for sleep    . mometasone (ASMANEX, 30 METERED DOSES,) 220 MCG/INH inhaler Inhale 2 puffs into the lungs daily. 1 Inhaler 5   No current facility-administered medications for this visit.    Allergies: Allergies  Allergen Reactions  . Penicillins Anaphylaxis    Has patient had a PCN reaction causing immediate rash, facial/tongue/throat swelling, SOB or lightheadedness with hypotension:  yes Has patient had a PCN reaction causing severe rash involving mucus membranes or skin necrosis:  no Has patient had a PCN reaction that required hospitalization: yes Has patient had a PCN reaction occurring within the last 10 years: no If all of the above answers are "NO", then may proceed with Cephalosporin use.   . Albuterol Other (See Comments)    Jittery   . Codeine Hives  .  Compazine [Prochlorperazine Edisylate]     Panic attacks  . Diclofenac Hives  . Midazolam Itching  . Zolpidem Other (See Comments)    Vivid nightmares/ sleep walking  . Betasept Surgical Scrub [Chlorhexidine Gluconate] Rash   I reviewed her past medical history, social history, family history, and environmental history and no significant changes have been reported from previous visits.    Objective:  Physical exam not obtained as encounter was done via telephone.   Previous notes and tests were reviewed.  I discussed the assessment and treatment plan with the patient. The patient was provided an opportunity to ask questions and all were answered. The patient agreed with the plan and demonstrated an understanding of the instructions.   The patient was advised to call back or seek an in-person evaluation if the symptoms worsen or if the condition fails to improve as anticipated.  I provided 15 minutes of non-face-to-face time during this encounter.  It was my pleasure to participate in [place] care today. Please feel free to contact me with any questions or concerns.   Sincerely,  Valentina Shaggy, MD

## 2019-02-07 NOTE — Progress Notes (Signed)
Start [address] Location home End [phone]

## 2019-02-07 NOTE — Patient Instructions (Addendum)
1. Mild persistent asthma, uncomplicated - We have one sample of Pulmicort and will provide that to the patient today. - We are sending in Asmanex 282mcg two puffs twice daily (samples of this provided today as well) - Daily controller medication(s): Pulmicort 153mcg two puffs twice daily - Rescue medications: ProAir 4 puffs every 4-6 hours as needed - Changes during respiratory infections or worsening symptoms: increase Pulmicort 157mcg to 4 puffs twice daily for TWO WEEKS. - Asthma control goals:  * Full participation in all desired activities (may need albuterol before activity) * Albuterol use two time or less a week on average (not counting use with activity) * Cough interfering with sleep two time or less a month * Oral steroids no more than once a year * No hospitalizations  2. Chronic rhinitis - Continue with nasal saline rinses. - Continue with Nasacort 1-2 sprays per nostril daily as needed. - Continue with Zyrtec (cetirizine) 10mg  daily as needed.    3. Return if symptoms worsen or fail to improve.    Please inform us of any Emergency Department visits, hospitalizations, or changes in symptoms. Call us before going to the ED for breathing or allergy symptoms since we might be able to fit you in for a sick visit. Feel free to contact us anytime with any questions, problems, or concerns.  It was a pleasure to talk to you today today!  Websites that have reliable patient information: 1. American Academy of Asthma, Allergy, and Immunology: www.aaaai.org 2. Food Allergy Research and Education (FARE): foodallergy.org 3. Mothers of Asthmatics: http://www.asthmacommunitynetwork.org 4. American College of Allergy, Asthma, and Immunology: www.acaai.org  "Like" [place] on Facebook and Instagram for our latest updates!      Make sure you are registered to vote! If you have moved or changed any of your contact information, you will need to get this updated before voting!    Voter ID  laws are NOT going into effect for the General Election in November 2020! DO NOT let this stop you from exercising your right to vote!

## 2019-05-10 ENCOUNTER — Telehealth: Payer: Self-pay | Admitting: *Deleted

## 2019-05-10 NOTE — Telephone Encounter (Signed)
Received refill request for Rhinocort faxed back to [facility] [phone] denied due to patient not being on this medication she is on Nasacort

## 2019-06-27 ENCOUNTER — Other Ambulatory Visit: Payer: Self-pay

## 2019-07-11 ENCOUNTER — Other Ambulatory Visit: Payer: Self-pay | Admitting: Orthopedic Surgery

## 2019-07-11 DIAGNOSIS — M545 Low back pain, unspecified: Secondary | ICD-10-CM

## 2019-07-17 ENCOUNTER — Telehealth: Payer: Self-pay | Admitting: Allergy & Immunology

## 2019-07-17 MED ORDER — ASMANEX (30 METERED DOSES) 220 MCG/INH IN AEPB: 2.0000 | INHALATION_SPRAY | Freq: Every day | RESPIRATORY_TRACT | 5 refills | Status: DC

## 2019-07-17 NOTE — Telephone Encounter (Signed)
Pt husband called and said she needs asmanex called into [facility] [address]. [phone]

## 2019-07-17 NOTE — Telephone Encounter (Signed)
Med sent.

## 2019-11-12 ENCOUNTER — Other Ambulatory Visit: Payer: Self-pay

## 2019-11-12 ENCOUNTER — Telehealth: Payer: Self-pay | Admitting: Allergy & Immunology

## 2019-11-12 MED ORDER — ASMANEX (30 METERED DOSES) 220 MCG/INH IN AEPB: 2.0000 | INHALATION_SPRAY | Freq: Every day | RESPIRATORY_TRACT | 0 refills | Status: DC

## 2019-11-12 MED ORDER — ASMANEX (30 METERED DOSES) 220 MCG/INH IN AEPB: 2.0000 | INHALATION_SPRAY | Freq: Every day | RESPIRATORY_TRACT | 1 refills | Status: DC

## 2019-11-12 NOTE — Telephone Encounter (Signed)
Refills sent. Patient's spouse made aware. Office visit schedule.

## 2019-11-12 NOTE — Telephone Encounter (Signed)
No that's fine. We could set one up for May 2021. Please send in refills to last through then.   Salvatore Marvel, MD

## 2019-11-12 NOTE — Telephone Encounter (Signed)
Courtesy refill for Asmanex 220 #2 inhalers (60 metered doses) sent to [facility]. Patient's husband made aware. Patient has not been seen since 02/2019. Patient's husband verbalizes that patient is currently in hospice care. Do we need to set up a tele-visit to authorize further refills? Dr. Ernst Bowler please advise.

## 2019-11-12 NOTE — Telephone Encounter (Signed)
Patient's husband called requesting a refill for Asmanex. He would like a call back because he says the refill is for 15 days and that is not enough to get her through. [facility] [address].

## 2019-12-01 ENCOUNTER — Emergency Department (HOSPITAL_COMMUNITY): Payer: BC Managed Care – PPO

## 2019-12-01 ENCOUNTER — Inpatient Hospital Stay (HOSPITAL_COMMUNITY): Payer: BC Managed Care – PPO

## 2019-12-01 ENCOUNTER — Inpatient Hospital Stay (HOSPITAL_COMMUNITY)
Admission: EM | Admit: 2019-12-01 | Discharge: 2019-12-05 | DRG: 853 | Disposition: A | Discharge status: Hospice | Payer: BC Managed Care – PPO | Attending: Internal Medicine | Admitting: Internal Medicine

## 2019-12-01 ENCOUNTER — Other Ambulatory Visit: Payer: Self-pay

## 2019-12-01 ENCOUNTER — Encounter (HOSPITAL_COMMUNITY): Payer: Self-pay

## 2019-12-01 DIAGNOSIS — F329 Major depressive disorder, single episode, unspecified: Secondary | ICD-10-CM | POA: Diagnosis present

## 2019-12-01 DIAGNOSIS — C717 Malignant neoplasm of brain stem: Secondary | ICD-10-CM | POA: Diagnosis present

## 2019-12-01 DIAGNOSIS — M170 Bilateral primary osteoarthritis of knee: Secondary | ICD-10-CM | POA: Diagnosis present

## 2019-12-01 DIAGNOSIS — Z515 Encounter for palliative care: Secondary | ICD-10-CM | POA: Diagnosis present

## 2019-12-01 DIAGNOSIS — J4540 Moderate persistent asthma, uncomplicated: Secondary | ICD-10-CM | POA: Diagnosis not present

## 2019-12-01 DIAGNOSIS — Z931 Gastrostomy status: Secondary | ICD-10-CM | POA: Diagnosis not present

## 2019-12-01 DIAGNOSIS — E872 Acidosis: Secondary | ICD-10-CM | POA: Diagnosis present

## 2019-12-01 DIAGNOSIS — R1310 Dysphagia, unspecified: Secondary | ICD-10-CM | POA: Diagnosis present

## 2019-12-01 DIAGNOSIS — N201 Calculus of ureter: Secondary | ICD-10-CM | POA: Diagnosis present

## 2019-12-01 DIAGNOSIS — F419 Anxiety disorder, unspecified: Secondary | ICD-10-CM | POA: Diagnosis present

## 2019-12-01 DIAGNOSIS — Z66 Do not resuscitate: Secondary | ICD-10-CM | POA: Diagnosis present

## 2019-12-01 DIAGNOSIS — J9601 Acute respiratory failure with hypoxia: Secondary | ICD-10-CM | POA: Diagnosis present

## 2019-12-01 DIAGNOSIS — A4159 Other Gram-negative sepsis: Secondary | ICD-10-CM | POA: Diagnosis present

## 2019-12-01 DIAGNOSIS — K649 Unspecified hemorrhoids: Secondary | ICD-10-CM | POA: Diagnosis present

## 2019-12-01 DIAGNOSIS — Z79899 Other long term (current) drug therapy: Secondary | ICD-10-CM

## 2019-12-01 DIAGNOSIS — K219 Gastro-esophageal reflux disease without esophagitis: Secondary | ICD-10-CM | POA: Diagnosis present

## 2019-12-01 DIAGNOSIS — Z20822 Contact with and (suspected) exposure to covid-19: Secondary | ICD-10-CM | POA: Diagnosis present

## 2019-12-01 DIAGNOSIS — Z7952 Long term (current) use of systemic steroids: Secondary | ICD-10-CM

## 2019-12-01 DIAGNOSIS — R6520 Severe sepsis without septic shock: Secondary | ICD-10-CM | POA: Diagnosis not present

## 2019-12-01 DIAGNOSIS — Z7951 Long term (current) use of inhaled steroids: Secondary | ICD-10-CM

## 2019-12-01 DIAGNOSIS — Z1611 Resistance to penicillins: Secondary | ICD-10-CM | POA: Diagnosis present

## 2019-12-01 DIAGNOSIS — Z923 Personal history of irradiation: Secondary | ICD-10-CM

## 2019-12-01 DIAGNOSIS — E876 Hypokalemia: Secondary | ICD-10-CM | POA: Diagnosis not present

## 2019-12-01 DIAGNOSIS — N200 Calculus of kidney: Secondary | ICD-10-CM | POA: Diagnosis not present

## 2019-12-01 DIAGNOSIS — G8929 Other chronic pain: Secondary | ICD-10-CM | POA: Diagnosis present

## 2019-12-01 DIAGNOSIS — N136 Pyonephrosis: Secondary | ICD-10-CM | POA: Diagnosis present

## 2019-12-01 DIAGNOSIS — C720 Malignant neoplasm of spinal cord: Secondary | ICD-10-CM | POA: Diagnosis present

## 2019-12-01 DIAGNOSIS — Z885 Allergy status to narcotic agent status: Secondary | ICD-10-CM

## 2019-12-01 DIAGNOSIS — G9341 Metabolic encephalopathy: Secondary | ICD-10-CM | POA: Diagnosis present

## 2019-12-01 DIAGNOSIS — Z87442 Personal history of urinary calculi: Secondary | ICD-10-CM

## 2019-12-01 DIAGNOSIS — Z9221 Personal history of antineoplastic chemotherapy: Secondary | ICD-10-CM | POA: Diagnosis not present

## 2019-12-01 DIAGNOSIS — C719 Malignant neoplasm of brain, unspecified: Secondary | ICD-10-CM

## 2019-12-01 DIAGNOSIS — B961 Klebsiella pneumoniae [K. pneumoniae] as the cause of diseases classified elsewhere: Secondary | ICD-10-CM | POA: Diagnosis present

## 2019-12-01 DIAGNOSIS — A419 Sepsis, unspecified organism: Secondary | ICD-10-CM | POA: Diagnosis present

## 2019-12-01 DIAGNOSIS — Z9981 Dependence on supplemental oxygen: Secondary | ICD-10-CM

## 2019-12-01 DIAGNOSIS — Z888 Allergy status to other drugs, medicaments and biological substances status: Secondary | ICD-10-CM

## 2019-12-01 DIAGNOSIS — Z880 Allergy status to penicillin: Secondary | ICD-10-CM

## 2019-12-01 DIAGNOSIS — Z886 Allergy status to analgesic agent status: Secondary | ICD-10-CM

## 2019-12-01 LAB — CBC WITH DIFFERENTIAL/PLATELET
Abs Immature Granulocytes: 0.1 10*3/uL — ABNORMAL HIGH (ref 0.00–0.07)
Basophils Absolute: 0 10*3/uL (ref 0.0–0.1)
Basophils Relative: 0 %
Eosinophils Absolute: 0 10*3/uL (ref 0.0–0.5)
Eosinophils Relative: 0 %
HCT: 42.8 % (ref 36.0–46.0)
Hemoglobin: 13.7 g/dL (ref 12.0–15.0)
Immature Granulocytes: 1 %
Lymphocytes Relative: 4 %
Lymphs Abs: 0.5 10*3/uL — ABNORMAL LOW (ref 0.7–4.0)
MCH: 30.3 pg (ref 26.0–34.0)
MCHC: 32 g/dL (ref 30.0–36.0)
MCV: 94.7 fL (ref 80.0–100.0)
Monocytes Absolute: 0.6 10*3/uL (ref 0.1–1.0)
Monocytes Relative: 5 %
Neutro Abs: 9.9 10*3/uL — ABNORMAL HIGH (ref 1.7–7.7)
Neutrophils Relative %: 90 %
Platelets: 138 10*3/uL — ABNORMAL LOW (ref 150–400)
RBC: 4.52 MIL/uL (ref 3.87–5.11)
RDW: 14.8 % (ref 11.5–15.5)
WBC: 11 10*3/uL — ABNORMAL HIGH (ref 4.0–10.5)
nRBC: 0 % (ref 0.0–0.2)

## 2019-12-01 LAB — URINALYSIS, ROUTINE W REFLEX MICROSCOPIC
Bilirubin Urine: NEGATIVE
Glucose, UA: NEGATIVE mg/dL
Ketones, ur: 5 mg/dL — AB
Nitrite: NEGATIVE
Protein, ur: 30 mg/dL — AB
Specific Gravity, Urine: 1.02 (ref 1.005–1.030)
WBC, UA: >50 WBC/hpf — ABNORMAL HIGH (ref 0–5)
pH: 6 (ref 5.0–8.0)

## 2019-12-01 LAB — COMPREHENSIVE METABOLIC PANEL
ALT: 44 U/L (ref 0–44)
AST: 19 U/L (ref 15–41)
Albumin: 4 g/dL (ref 3.5–5.0)
Alkaline Phosphatase: 48 U/L (ref 38–126)
Anion gap: 13 (ref 5–15)
BUN: 19 mg/dL (ref 6–20)
CO2: 24 mmol/L (ref 22–32)
Calcium: 9.2 mg/dL (ref 8.9–10.3)
Chloride: 102 mmol/L (ref 98–111)
Creatinine, Ser: 0.49 mg/dL (ref 0.44–1.00)
GFR calc Af Amer: >60 mL/min (ref >60)
GFR calc non Af Amer: >60 mL/min (ref >60)
Glucose, Bld: 112 mg/dL — ABNORMAL HIGH (ref 70–99)
Potassium: 3.4 mmol/L — ABNORMAL LOW (ref 3.5–5.1)
Sodium: 139 mmol/L (ref 135–145)
Total Bilirubin: 0.5 mg/dL (ref 0.3–1.2)
Total Protein: 7.2 g/dL (ref 6.5–8.1)

## 2019-12-01 LAB — LIPASE, BLOOD: Lipase: 25 U/L (ref 11–51)

## 2019-12-01 LAB — HCG, QUANTITATIVE, PREGNANCY: hCG, Beta Chain, Quant, S: <1 m[IU]/mL (ref <5)

## 2019-12-01 MED ORDER — SODIUM CHLORIDE 0.9 % IV BOLUS
1000.0000 mL | Freq: Once | INTRAVENOUS | Status: AC
  Administered 2019-12-01: 1000 mL via INTRAVENOUS

## 2019-12-01 MED ORDER — ONDANSETRON HCL 4 MG/2ML IJ SOLN
4.0000 mg | Freq: Once | INTRAMUSCULAR | Status: DC
  Filled 2019-12-01: qty 2

## 2019-12-01 MED ORDER — FENTANYL CITRATE (PF) 100 MCG/2ML IJ SOLN
INTRAMUSCULAR | Status: AC
  Filled 2019-12-01: qty 2

## 2019-12-01 MED ORDER — CIPROFLOXACIN IN D5W 400 MG/200ML IV SOLN
400.0000 mg | Freq: Once | INTRAVENOUS | Status: AC
  Administered 2019-12-01: 400 mg via INTRAVENOUS
  Filled 2019-12-01: qty 200

## 2019-12-01 MED ORDER — ONDANSETRON HCL 4 MG/2ML IJ SOLN
4.0000 mg | Freq: Once | INTRAMUSCULAR | Status: AC
  Administered 2019-12-01: 4 mg via INTRAVENOUS
  Filled 2019-12-01: qty 2

## 2019-12-01 MED ORDER — HYDROMORPHONE HCL 1 MG/ML IJ SOLN
1.0000 mg | INTRAMUSCULAR | Status: DC | PRN
  Administered 2019-12-02: 1 mg via INTRAVENOUS
  Filled 2019-12-01: qty 1

## 2019-12-01 MED ORDER — LIDOCAINE 2% (20 MG/ML) 5 ML SYRINGE
INTRAMUSCULAR | Status: AC
  Filled 2019-12-01: qty 5

## 2019-12-01 MED ORDER — HYDROMORPHONE HCL 1 MG/ML IJ SOLN
1.0000 mg | Freq: Once | INTRAMUSCULAR | Status: AC
  Administered 2019-12-01: 1 mg via INTRAVENOUS
  Filled 2019-12-01: qty 1

## 2019-12-01 MED ORDER — PROPOFOL 10 MG/ML IV BOLUS
INTRAVENOUS | Status: AC
  Filled 2019-12-01: qty 40

## 2019-12-01 MED ORDER — ONDANSETRON HCL 4 MG/2ML IJ SOLN
4.0000 mg | Freq: Once | INTRAMUSCULAR | Status: AC
  Administered 2019-12-01: 22:00:00 4 mg via INTRAVENOUS
  Filled 2019-12-01: qty 2

## 2019-12-01 MED ORDER — POTASSIUM CHLORIDE 10 MEQ/100ML IV SOLN
10.0000 meq | Freq: Once | INTRAVENOUS | Status: DC
  Filled 2019-12-01: qty 100

## 2019-12-01 MED ORDER — HYDROMORPHONE HCL 1 MG/ML IJ SOLN
1.0000 mg | Freq: Once | INTRAMUSCULAR | Status: AC
  Administered 2019-12-01: 18:00:00 1 mg via INTRAVENOUS
  Filled 2019-12-01: qty 1

## 2019-12-01 MED ORDER — SODIUM CHLORIDE 0.9 % IV BOLUS
1000.0000 mL | Freq: Once | INTRAVENOUS | Status: AC
  Administered 2019-12-01: 18:00:00 1000 mL via INTRAVENOUS

## 2019-12-01 MED ORDER — SODIUM CHLORIDE 0.9 % IV SOLN: INTRAVENOUS | Status: DC

## 2019-12-01 NOTE — ED Provider Notes (Signed)
[facility] DEPT Provider Note   CSN: [id number] Arrival date & time: 12/01/19  1654     History Chief Complaint  Patient presents with  . Flank Pain    [name] is a 44 y.o. female.  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  She believes this is due to a kidney stone.  She states she is having difficulty with urination and having frequency and urgency.  She has nausea which is chronic.  Denies any vomiting.  Denies any fever.  Pain seems similar to previous kidney stone.  Does she does have chronic back pain.  Patient does have a fentanyl patch and she takes Dilaudid 4 mg at home as needed.  Per the hospice nurse, she has only been taking Dilaudid twice daily which was recently increased to 3 times daily.  She had a total of 4 doses today of 4 mg.  She reports unbearable pain to her left flank that wraps around to her groin.  There is no radiation of the pain down her back or legs.  Denies any bowel or bladder incontinence.  Denies any fever or vomiting.  Denies any chest pain or shortness of breath.  Her slurred speech is at baseline.  The history is provided by the patient.  Flank Pain Associated symptoms include abdominal pain. Pertinent negatives include no chest pain, no headaches and no shortness of breath.       Past Medical History:  Diagnosis Date  . Anxiety   . Arthritis    arthritis in knees  . Asthma    asthma since a child. Well controlled  . Brain tumor ([place])   . 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 Kidney stone   . Migraine   . Pneumonia    last time 2 yr ago  . Recurrent upper respiratory infection (URI)   . Urticaria     Patient Active Problem List   Diagnosis Date Noted  . Decreased activities of daily living (ADL) 11/17/2018  . Pharyngeal dysphagia 11/17/2018  .  Wound infection 08/08/2018  . 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 Dysphagia 07/04/2018  . Pain of left hip joint 04/07/2018  . Moderate persistent asthma without complication 0000000  . Seasonal and perennial allergic rhinitis 04/04/2018  . Chronic fatigue 01/26/2018  . Vomiting due to chemotherapy 11/03/2017  . 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 Asthma with acute exacerbation 01/23/2016  . Acute sinusitis 01/23/2016  . Allergic rhinitis 01/23/2016  . Neoplasm related pain 04/28/2015  . Chronic pain syndrome 11/21/2014  . Acute nonintractable headache 07/25/2014  . Facet arthropathy, cervical 07/25/2014  . 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 Asthma 10/11/2013  . GERD (gastroesophageal reflux disease) 10/11/2013  . Brain tumor ([place]) 10/11/2013  . ILD (interstitial lung disease) ([place]) 07/30/2012  . Dyspnea 07/16/2012    Past Surgical History:  Procedure Laterality Date  . BRAIN SURGERY  2015   Brain surgery x 4 in 2015 at [facility]    . CHOLECYSTECTOMY    . CYSTOSCOPY W/ URETERAL STENT PLACEMENT Right 10/26/2016   Procedure: CYSTOSCOPY WITH RETROGRADE PYELOGRAM/URETERAL STENT PLACEMENT;  Surgeon: [name], MD;  Location: [facility];  Service: Urology;  Laterality: Right;  .  CYSTOSCOPY WITH RETROGRADE PYELOGRAM, URETEROSCOPY AND STENT PLACEMENT Right 10/18/2014   Procedure: CYSTOSCOPY WITH RETROGRADE PYELOGRAM, URETEROSCOPY, STONE EXTRACTION AND STENT PLACEMENT;  Surgeon: Arvil Persons, MD;  Location: [facility];  Service: Urology;  Laterality: Right;  . CYSTOSCOPY WITH RETROGRADE PYELOGRAM, URETEROSCOPY AND STENT PLACEMENT Right 10/29/2016   Procedure: CYSTOSCOPY WITH RETROGRADE PYELOGRAM, URETEROSCOPY AND STENT PLACEMENT;  Surgeon: [name], MD;  Location: [facility];  Service: Urology;  Laterality: Right;  . CYSTOSTOMY W/ STENT INSERTION     numerous times  . HOLMIUM LASER  APPLICATION Right [id number]   Procedure: HOLMIUM LASER APPLICATION;  Surgeon: Arvil Persons, MD;  Location: [facility];  Service: Urology;  Laterality: Right;  . HOLMIUM LASER APPLICATION Right [id number]   Procedure: HOLMIUM LASER APPLICATION;  Surgeon: [name], MD;  Location: [facility];  Service: Urology;  Laterality: Right;  . LASER ABLATION OF THE CERVIX       OB History   No obstetric history on file.     Family History  Problem Relation Age of Onset  . Allergies Father   . Heart disease Mother   . Clotting disorder Mother   . Heart disease Maternal Grandmother   . Heart disease Paternal Grandmother   . Cancer Other        grandparents  . Urticaria Neg Hx   . Immunodeficiency Neg Hx   . Eczema Neg Hx   . Atopy Neg Hx   . Asthma Neg Hx   . Angioedema Neg Hx   . Allergic rhinitis Neg Hx     Social History   Tobacco Use  . Smoking status: Never Smoker  . Smokeless tobacco: Never Used  Substance Use Topics  . Alcohol use: No    Alcohol/week: 0.0 standard drinks  . Drug use: No    Home Medications Prior to Admission medications   Medication Sig Start Date End Date Taking? Authorizing Provider  amphetamine-dextroamphetamine (ADDERALL XR) 10 MG 24 hr capsule Take 20 mg by mouth daily.    [provider]  budesonide (PULMICORT FLEXHALER) 180 MCG/ACT inhaler TAKE 2 PUFFS BY MOUTH TWICE A DAY 11/17/18   Valentina Shaggy, MD  budesonide (RHINOCORT AQUA) 32 MCG/ACT nasal spray Place 1 spray into both nostrils daily. Patient taking differently: Place 1 spray into both nostrils at bedtime.  04/03/18   Valentina Shaggy, MD  cetirizine (ZYRTEC) 10 MG tablet TAKE 1 TABLET BY MOUTH ONCE DAILY FOR RUNNY NOSE/ITCHING 07/24/18   Valentina Shaggy, MD  Cranberry 500 MG CAPS Take 500 mg by mouth at bedtime.     [provider]  diazepam (VALIUM) 5 MG tablet Take 5 mg by mouth every 12 (twelve) hours as needed for anxiety (sleep).     [provider]  docusate sodium (COLACE) 100 MG capsule Take 100 mg by mouth at bedtime.    [provider]  dronabinol (MARINOL) 2.5 MG capsule Take 2.5 mg by mouth every 12 (twelve) hours as needed.    [provider]  Eszopiclone 3 MG TABS Take 3 mg by mouth daily at 10 pm. Reported on 01/23/2016    [provider]  HYDROmorphone (DILAUDID) 4 MG tablet Take 1-3 tablets every 6 hours as needed for pain Patient taking differently: Take 4 mg by mouth See admin instructions. Take one tablet (4 mg) by mouth twice daily - 6am and 2pm 11/07/16   Lolita Rieger, MD  ibuprofen (ADVIL,MOTRIN) 200 MG tablet Take 400 mg  by mouth every 6 (six) hours as needed (pain).    [provider]  lansoprazole (PREVACID) 15 MG capsule Take 15 mg by mouth 2 (two) times daily before a meal.    [provider]  levalbuterol (XOPENEX HFA) 45 MCG/ACT inhaler Inhale 2 puffs into the lungs every 4 (four) hours as needed for wheezing or shortness of breath. 02/07/19   Valentina Shaggy, MD  LORazepam (ATIVAN) 0.5 MG tablet Take 0.5 mg by mouth every 6 (six) hours as needed for anxiety.    [provider]  MEGARED OMEGA-3 KRILL OIL 500 MG CAPS Take 500 mg by mouth at bedtime.    [provider]  mometasone (ASMANEX, 30 METERED DOSES,) 220 MCG/INH inhaler Inhale 2 puffs into the lungs daily. 11/12/19   Valentina Shaggy, MD  OLANZapine (ZYPREXA) 10 MG tablet Take 10 mg by mouth at bedtime. 03/06/18   [provider]  omeprazole (PRILOSEC OTC) 20 MG tablet Take 20 mg by mouth at bedtime.    [provider]  oxyCODONE ER (XTAMPZA ER) 27 MG C12A Take 27 mg by mouth 2 (two) times daily.    [provider]  polyethylene glycol (MIRALAX / GLYCOLAX) packet Take 17 g by mouth 2 (two) times daily as needed.    [provider]  predniSONE (DELTASONE) 20 MG tablet Take 20 mg by mouth 2 (two) times daily with a meal.    [provider]  promethazine  (PHENERGAN) 25 MG tablet Take 25 mg by mouth every 6 (six) hours as needed for nausea or vomiting.     [provider]  senna-docusate (SENOKOT-S) 8.6-50 MG tablet Take 1 tablet by mouth 2 (two) times daily as needed for mild constipation.    [provider]  tamsulosin (FLOMAX) 0.4 MG CAPS capsule Take 1 capsule (0.4 mg total) by mouth daily after supper. 06/17/17   Khatri, Hina, PA-C  Tapentadol HCl (NUCYNTA ER) 150 MG TB12 Take 150 mg by mouth every 12 (twelve) hours. 0600 and 1800     [provider]  temazepam (RESTORIL) 15 MG capsule Take 15 mg by mouth See admin instructions. Take one capsule (15 mg) by mouth in the middle of the night as needed for sleep 07/29/16   [provider]    Allergies    Penicillins, Albuterol, Codeine, Compazine [prochlorperazine edisylate], Diclofenac, Midazolam, Zolpidem, and Betasept surgical scrub [chlorhexidine gluconate]  Review of Systems   Review of Systems  Constitutional: Negative for activity change, appetite change, fatigue and fever.  HENT: Negative for congestion and rhinorrhea.   Eyes: Negative for visual disturbance.  Respiratory: Negative for cough, chest tightness, shortness of breath and wheezing.   Cardiovascular: Negative for chest pain.  Gastrointestinal: Positive for abdominal pain and nausea. Negative for vomiting.  Genitourinary: Positive for flank pain. Negative for dysuria and hematuria.  Musculoskeletal: Positive for arthralgias and myalgias.  Skin: Negative for rash.  Neurological: Negative for dizziness, weakness and headaches.   all other systems are negative except as noted in the HPI and PMH.    Physical Exam Updated Vital Signs BP 129/74 (BP Location: Right Arm)   Pulse 98   Temp 98.6 F (37 C) (Oral)   Resp 18   SpO2 96%   Physical Exam Vitals and nursing note reviewed.  Constitutional:      General: She is not in acute distress.    Appearance: She is well-developed. She is  obese. She is ill-appearing.  Comments: Chronically ill-appearing, laying on left side, slurred speech which is her baseline by report  HENT:     Head: Normocephalic and atraumatic.     Mouth/Throat:     Pharynx: No oropharyngeal exudate.  Eyes:     Conjunctiva/sclera: Conjunctivae normal.     Pupils: Pupils are equal, round, and reactive to light.  Neck:     Comments: No meningismus. Cardiovascular:     Rate and Rhythm: Normal rate and regular rhythm.     Heart sounds: Normal heart sounds. No murmur.  Pulmonary:     Effort: Pulmonary effort is normal. No respiratory distress.     Breath sounds: Normal breath sounds.  Abdominal:     Palpations: Abdomen is soft.     Tenderness: There is abdominal tenderness. There is no guarding or rebound.  Musculoskeletal:        General: Tenderness present. Normal range of motion.     Cervical back: Normal range of motion and neck supple.     Comments: Left paraspinal lumbar tenderness  Skin:    General: Skin is warm.  Neurological:     Mental Status: She is alert and oriented to person, place, and time.     Cranial Nerves: No cranial nerve deficit.     Motor: No abnormal muscle tone.     Coordination: Coordination normal.     Comments:  Left-sided arm and leg weakness at baseline by reports.  5/5 strength on right.  Psychiatric:        Behavior: Behavior normal.     ED Results / Procedures / Treatments   Labs (all labs ordered are listed, but only abnormal results are displayed) Labs Reviewed  CBC WITH DIFFERENTIAL/PLATELET - Abnormal; Notable for the following components:      Result Value   WBC 11.0 (*)    Platelets 138 (*)    Neutro Abs 9.9 (*)    Lymphs Abs 0.5 (*)    Abs Immature Granulocytes 0.10 (*)    All other components within normal limits  COMPREHENSIVE METABOLIC PANEL - Abnormal; Notable for the following components:   Potassium 3.4 (*)    Glucose, Bld 112 (*)    All other components within normal limits   URINALYSIS, ROUTINE W REFLEX MICROSCOPIC - Abnormal; Notable for the following components:   Color, Urine AMBER (*)    APPearance CLOUDY (*)    Hgb urine dipstick MODERATE (*)    Ketones, ur 5 (*)    Protein, ur 30 (*)    Leukocytes,Ua SMALL (*)    WBC, UA >50 (*)    Bacteria, UA MANY (*)    All other components within normal limits  CULTURE, BLOOD (ROUTINE X 2)  CULTURE, BLOOD (ROUTINE X 2)  URINE CULTURE  RESPIRATORY PANEL BY RT PCR (FLU A&B, COVID)  LIPASE, BLOOD  HCG, QUANTITATIVE, PREGNANCY  LACTIC ACID, PLASMA  LACTIC ACID, PLASMA  HIV ANTIBODY (ROUTINE TESTING W REFLEX)  BASIC METABOLIC PANEL  CBC  POC SARS CORONAVIRUS 2 AG -  ED    EKG None  Radiology CT Head Wo Contrast  Result Date: 12/01/2019 CLINICAL DATA:  Headache, history of brain tumor EXAM: CT HEAD WITHOUT CONTRAST TECHNIQUE: Contiguous axial images were obtained from the base of the skull through the vertex without intravenous contrast. COMPARISON:  06/05/2018 FINDINGS: Brain: No acute infarct or hemorrhage. Ventriculostomy catheter unchanged. Lateral ventricles are stable. Infiltrative mass within the brainstem again noted, compatible with known brainstem glioma. No significant change in the  appearance on this unenhanced exam. No acute extra-axial fluid collections. Vascular: No hyperdense vessel or unexpected calcification. Skull: Postsurgical changes are seen from ventriculostomy catheter and occipital craniectomy. No acute fractures. Sinuses/Orbits: No acute finding. Other: None IMPRESSION: 1. Stable known brainstem glioma. 2. No acute infarct or hemorrhage. 3. Postsurgical changes as above. Electronically Signed   By: Randa Ngo M.D.   On: 12/01/2019 20:41   CT Renal Stone Study  Result Date: 12/01/2019 CLINICAL DATA:  44 year old female with flank pain. Kidney stone suspected. EXAM: CT ABDOMEN AND PELVIS WITHOUT CONTRAST TECHNIQUE: Multidetector CT imaging of the abdomen and pelvis was performed  following the standard protocol without IV contrast. COMPARISON:  CT abdomen pelvis dated 03/11/2018. FINDINGS: Evaluation of this exam is limited in the absence of intravenous contrast. Lower chest: There are bibasilar linear atelectasis/scarring. A central venous line, likely a dual-lumen dialysis catheter partially visualized with tip close to the junction of the right atrium and IVC. A 4.0 x 2.5 cm partially visualized lobulated lesion adjacent to the aortic root, incompletely characterized but may represent a pericardial recess or cyst. No intra-abdominal free air. Trace free fluid in the pelvis. Hepatobiliary: The liver is grossly unremarkable. No intrahepatic biliary ductal dilatation. Cholecystectomy. Pancreas: Unremarkable. No pancreatic ductal dilatation or surrounding inflammatory changes. Spleen: Normal in size without focal abnormality. Adrenals/Urinary Tract: The adrenal glands are unremarkable. Two adjacent stones in the distal left ureter may each measuring approximately 7 mm. There is mild left hydronephrosis. Additional nonobstructing bilateral renal calculi noted. There is no hydronephrosis on the right. Small amount of air in the left renal upper pole collecting system may be related to recent instrumentation. Correlation with urinalysis recommended to exclude UTI. The urinary bladder is partially distended. There is air within the urinary bladder. Stomach/Bowel: There is a percutaneous gastrojejunostomy. There is no bowel obstruction or active inflammation. The appendix is normal. Vascular/Lymphatic: The abdominal aorta and IVC are unremarkable. No portal venous gas. There is no adenopathy. Reproductive: The uterus and ovaries are grossly unremarkable. Other: Partially visualized VP shunt with tip in the right lower quadrant. No fluid collection. Musculoskeletal: Osteopenia. No acute osseous pathology. IMPRESSION: 1. Two adjacent stones in the distal left ureter with mild left hydronephrosis.  Small amount of air in the left renal upper pole collecting system as well as within the urinary bladder may be related to recent instrumentation. Correlation with urinalysis recommended to exclude UTI. 2. No bowel obstruction. Normal appendix. Electronically Signed   By: Anner Crete M.D.   On: 12/01/2019 20:51    Procedures .Critical Care Performed by: Ezequiel Essex, MD Authorized by: Ezequiel Essex, MD   Critical care provider statement:    Critical care time (minutes):  45   Critical care was necessary to treat or prevent imminent or life-threatening deterioration of the following conditions:  Sepsis   Critical care was time spent personally by me on the following activities:  Discussions with consultants, evaluation of patient's response to treatment, examination of patient, ordering and performing treatments and interventions, ordering and review of laboratory studies, ordering and review of radiographic studies, pulse oximetry, re-evaluation of patient's condition, obtaining history from patient or surrogate and review of old charts   (including critical care time)  Medications Ordered in ED Medications  sodium chloride 0.9 % bolus 1,000 mL (has no administration in time range)  ondansetron (ZOFRAN) injection 4 mg (has no administration in time range)  HYDROmorphone (DILAUDID) injection 1 mg (has no administration in time range)  ED Course  I have reviewed the triage vital signs and the nursing notes.  Pertinent labs & imaging results that were available during my care of the patient were reviewed by me and considered in my medical decision making (see chart for details).    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  No fever or vomiting.  Discussed with hospice nurse Preyer patient has not been taking Dilaudid around-the-clock but only  as needed.  She states patient has been progressively declining in terms of her mental status over the past several weeks.  Discussed with patient's husband [name] by phone.  He states she has recently had her daughter noticing increased from twice daily to 3 times daily.  Today she has increased pain in groin related kidney stone.  There is been no fever.  There has been chronic nausea but no vomiting.  She feels like she needs to urinate.  Bladder scan 11 mL  Creatinine is normal.  Mild leukocytosis.  Patient unable to give urine sample.  CT scan shows 2 stones in the left distal ureter with hydronephrosis.  There is also some air which is concerning for infection.  Patient with difficult to control pain. UA concerning for infection and developed fever in the ED. Blood and urine cultures sent. Broad spectrum antibiotics started. PCN anaphylaxis. Cipro given.  D/w Dr. Junious Silk of urology. Agrees she likely has sepsis with obstructing ureteral stone. Will place stent tonight. Requests medical admission.   Patient and husband updated. She is increasingly tachycardic and vomiting. Additional IVF and antiemetics provided. Patient is DNR but agreeable to intubation for procedure if needed.  Hospitalist consult pending at shift change. PA-C Sanders to assume care.  Final Clinical Impression(s) / ED Diagnoses Final diagnoses:  Sepsis with encephalopathy without septic shock, due to unspecified organism [facility])  Ureteral stone    Rx / DC Orders ED Discharge Orders    None       Naquita Nappier, Annie Main, MD 12/01/19 2327

## 2019-12-01 NOTE — H&P (View-Only) (Signed)
Consult: left ureteral stones, left hydro, fever, UTI Requested by: Dr. Annie Main Rancour    History of Present Illness: [name] is a 44 year old female.  She follows with Dr. Louis Meckel for kidney stones.  She presented to the emergency department today with left flank pain radiating to left groin. Pain started this morning.  CT scan revealed 2 left distal ureteral stones, one about 6 mm and the more proximal about 5 mm with upstream hydroureteronephrosis.  There was some air in the bladder and air in the left collecting system concerning for a significant urinary tract infection.  Her white count was 11, creatinine 0.49, UA with many bacteria, greater than 50 white cells, 21-50 red cells, small leukocyte, negative nitrite.  Temperature was 100.1 and HR 98 - 141, BP 162/85.   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  She has had progressive weight gain and weakness this year. I spoke with [facility] nurse on call and endoscopic surgery is OK under their umbrella of care if recommended.   Past Medical History:  Diagnosis Date  . Anxiety   . Arthritis    arthritis in knees  . Asthma    asthma since a child. Well controlled  . Brain tumor ([place])   . Brain tumor, glioma ([place])   . Cancer ([place])    grade 2 astrocytoma in brain has had chemo and radiation. surgery x4 2015  . Kidney stone   . Migraine   . Pneumonia    last time 2 yr ago  . Recurrent upper respiratory infection (URI)   . Urticaria    Past Surgical History:  Procedure Laterality Date  . BRAIN SURGERY  2015   Brain surgery x 4 in 2015 at [place]    . CHOLECYSTECTOMY    . CYSTOSCOPY W/ URETERAL STENT PLACEMENT Right 10/26/2016   Procedure: CYSTOSCOPY WITH RETROGRADE PYELOGRAM/URETERAL STENT PLACEMENT;  Surgeon: [name], MD;   [facility]: [facility];  Service: Urology;  Laterality: Right;  . CYSTOSCOPY WITH RETROGRADE PYELOGRAM, URETEROSCOPY AND STENT PLACEMENT Right 10/18/2014   Procedure: CYSTOSCOPY WITH RETROGRADE PYELOGRAM, URETEROSCOPY, STONE EXTRACTION AND STENT PLACEMENT;  Surgeon: Arvil Persons, MD;  [facility];  Service: Urology;  Laterality: Right;  . CYSTOSCOPY WITH RETROGRADE PYELOGRAM, URETEROSCOPY AND STENT PLACEMENT Right 10/29/2016   Procedure: CYSTOSCOPY WITH RETROGRADE PYELOGRAM, URETEROSCOPY AND STENT PLACEMENT;  Surgeon: [name], MD;  [facility];  Service: Urology;  Laterality: Right;  . CYSTOSTOMY W/ STENT INSERTION     numerous times  . HOLMIUM LASER APPLICATION Right [id number]   Procedure: HOLMIUM LASER APPLICATION;  Surgeon: Arvil Persons, MD;  [facility];  Service: Urology;  Laterality: Right;  . HOLMIUM LASER APPLICATION Right [id number]   Procedure: HOLMIUM LASER APPLICATION;  Surgeon: [name], MD;  [facility];  Service: Urology;  Laterality: Right;  . LASER ABLATION OF THE CERVIX      Home Medications:  (Not in a hospital admission)  Allergies:  Allergies  Allergen Reactions  . Penicillins Anaphylaxis    Has patient had a PCN reaction causing immediate rash, facial/tongue/throat swelling, SOB or lightheadedness with hypotension:  yes Has patient had a PCN reaction causing severe rash involving mucus membranes or skin necrosis:  no Has patient had a PCN reaction that required hospitalization: yes Has patient had  a PCN reaction occurring within the last 10 years: no If all of the above answers are "NO", then may proceed with Cephalosporin use.   . Albuterol Other (See Comments)    Jittery   . Codeine Hives  . Compazine [Prochlorperazine Edisylate]     Panic attacks  . Diclofenac Hives  . Midazolam Itching  . Zolpidem Other (See Comments)    Vivid nightmares/ sleep walking  . Betasept Surgical Scrub [Chlorhexidine Gluconate] Rash    Family  History  Problem Relation Age of Onset  . Allergies Father   . Heart disease Mother   . Clotting disorder Mother   . Heart disease Maternal Grandmother   . Heart disease Paternal Grandmother   . Cancer Other        grandparents  . Urticaria Neg Hx   . Immunodeficiency Neg Hx   . Eczema Neg Hx   . Atopy Neg Hx   . Asthma Neg Hx   . Angioedema Neg Hx   . Allergic rhinitis Neg Hx    Social History:  reports that she has never smoked. She has never used smokeless tobacco. She reports that she does not drink alcohol or use drugs.  ROS: A complete review of systems was performed.  All systems are negative except for pertinent findings as noted. Review of Systems  Constitutional: Positive for chills and malaise/fatigue.     Physical Exam:  Vital signs in last 24 hours: Temp:  [98.6 F (37 C)-100.1 F (37.8 C)] 100.1 F (37.8 C) (02/27 2117) Pulse Rate:  [90-106] 106 (02/27 2044) Resp:  [18] 18 (02/27 2044) BP: (129-145)/(74-89) 145/89 (02/27 2044) SpO2:  [94 %-99 %] 99 % (02/27 2044) General:  Alert and oriented, cushinoid appearance, No acute distress, nodding yes and no to husband, answering appropriately. Speech is garbled/dysarthric at baseline but husband it's a little worse tonight Cardiovascular: tachycardic  Lungs: slightly labored breathing on 2L [place] Abdomen: Soft, nontender, nondistended, no abdominal masses Back: No CVA tenderness Extremities: mild edema Neurologic: Grossly intact   Laboratory Data:  Results for orders placed or performed during the hospital encounter of 12/01/19 (from the past 24 hour(s))  CBC with Differential/Platelet     Status: Abnormal   Collection Time: 12/01/19  5:58 PM  Result Value Ref Range   WBC 11.0 (H) 4.0 - 10.5 K/uL   RBC 4.52 3.87 - 5.11 MIL/uL   Hemoglobin 13.7 12.0 - 15.0 g/dL   HCT 42.8 36.0 - 46.0 %   MCV 94.7 80.0 - 100.0 fL   MCH 30.3 26.0 - 34.0 pg   MCHC 32.0 30.0 - 36.0 g/dL   RDW 14.8 11.5 - 15.5 %   Platelets  138 (L) 150 - 400 K/uL   nRBC 0.0 0.0 - 0.2 %   Neutrophils Relative % 90 %   Neutro Abs 9.9 (H) 1.7 - 7.7 K/uL   Lymphocytes Relative 4 %   Lymphs Abs 0.5 (L) 0.7 - 4.0 K/uL   Monocytes Relative 5 %   Monocytes Absolute 0.6 0.1 - 1.0 K/uL   Eosinophils Relative 0 %   Eosinophils Absolute 0.0 0.0 - 0.5 K/uL   Basophils Relative 0 %   Basophils Absolute 0.0 0.0 - 0.1 K/uL   Immature Granulocytes 1 %   Abs Immature Granulocytes 0.10 (H) 0.00 - 0.07 K/uL  Comprehensive metabolic panel     Status: Abnormal   Collection Time: 12/01/19  5:58 PM  Result Value Ref Range   Sodium 139 135 -  145 mmol/L   Potassium 3.4 (L) 3.5 - 5.1 mmol/L   Chloride 102 98 - 111 mmol/L   CO2 24 22 - 32 mmol/L   Glucose, Bld 112 (H) 70 - 99 mg/dL   BUN 19 6 - 20 mg/dL   Creatinine, Ser 0.49 0.44 - 1.00 mg/dL   Calcium 9.2 8.9 - 10.3 mg/dL   Total Protein 7.2 6.5 - 8.1 g/dL   Albumin 4.0 3.5 - 5.0 g/dL   AST 19 15 - 41 U/L   ALT 44 0 - 44 U/L   Alkaline Phosphatase 48 38 - 126 U/L   Total Bilirubin 0.5 0.3 - 1.2 mg/dL   GFR calc non Af Amer >60 >60 mL/min   GFR calc Af Amer >60 >60 mL/min   Anion gap 13 5 - 15  Lipase, blood     Status: None   Collection Time: 12/01/19  5:58 PM  Result Value Ref Range   Lipase 25 11 - 51 U/L  hCG, quantitative, pregnancy     Status: None   Collection Time: 12/01/19  5:59 PM  Result Value Ref Range   hCG, Beta Chain, Quant, S <1 <5 mIU/mL  Urinalysis, Routine w reflex microscopic     Status: Abnormal   Collection Time: 12/01/19  9:15 PM  Result Value Ref Range   Color, Urine AMBER (A) YELLOW   APPearance CLOUDY (A) CLEAR   Specific Gravity, Urine 1.020 1.005 - 1.030   pH 6.0 5.0 - 8.0   Glucose, UA NEGATIVE NEGATIVE mg/dL   Hgb urine dipstick MODERATE (A) NEGATIVE   Bilirubin Urine NEGATIVE NEGATIVE   Ketones, ur 5 (A) NEGATIVE mg/dL   Protein, ur 30 (A) NEGATIVE mg/dL   Nitrite NEGATIVE NEGATIVE   Leukocytes,Ua SMALL (A) NEGATIVE   RBC / HPF 21-50 0 - 5  RBC/hpf   WBC, UA >50 (H) 0 - 5 WBC/hpf   Bacteria, UA MANY (A) NONE SEEN   WBC Clumps PRESENT    Mucus PRESENT    Hyaline Casts, UA PRESENT    Ca Oxalate Crys, UA PRESENT    No results found for this or any previous visit (from the past 240 hour(s)). Creatinine: Recent Labs    12/01/19 1758  CREATININE 0.49    Impression/Assessment/plan: Left ureteral stone, left hydronephrosis, UTI/sepsis-  I had a frank discussion with Ronalyn and her husband.  She has a life-threatening condition.  We discussed the nature, potential benefits, risks and alternatives to cystoscopy with left ureteral stent placement, left retrograde pyelogram, including side effects of the proposed treatment, the likelihood of the patient achieving the goals of the procedure, and any potential problems that might occur during the procedure or recuperation.  We also discussed left percutaneous nephrostomy placement.  Jeeya and her husband both elected to proceed with stent placement when they understand given her anatomy and prior history of aspiration and hypoxia, she may need an endotracheal tube and ventilation.  She may not wean off the ventilator.  We discussed it and again they do want to proceed with stent placement.  We discussed the rationale for a staged procedure with stent placement only this evening. All questions answered. Patient and her husband elect to proceed.    Festus Aloe 12/01/2019, 10:41 PM

## 2019-12-01 NOTE — Consult Note (Signed)
Consult: left ureteral stones, left hydro, fever, UTI Requested by: Dr. Annie Main Rancour    History of Present Illness: [name] is a 44 year old female.  She follows with Dr. Louis Meckel for kidney stones.  She presented to the emergency department today with left flank pain radiating to left groin. Pain started this morning.  CT scan revealed 2 left distal ureteral stones, one about 6 mm and the more proximal about 5 mm with upstream hydroureteronephrosis.  There was some air in the bladder and air in the left collecting system concerning for a significant urinary tract infection.  Her white count was 11, creatinine 0.49, UA with many bacteria, greater than 50 white cells, 21-50 red cells, small leukocyte, negative nitrite.  Temperature was 100.1 and HR 98 - 141, BP 162/85.   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  She has had progressive weight gain and weakness this year. I spoke with [facility] nurse on call and endoscopic surgery is OK under their umbrella of care if recommended.   Past Medical History:  Diagnosis Date  . Anxiety   . Arthritis    arthritis in knees  . Asthma    asthma since a child. Well controlled  . Brain tumor ([place])   . Brain tumor, glioma ([place])   . Cancer ([place])    grade 2 astrocytoma in brain has had chemo and radiation. surgery x4 2015  . Kidney stone   . Migraine   . Pneumonia    last time 2 yr ago  . Recurrent upper respiratory infection (URI)   . Urticaria    Past Surgical History:  Procedure Laterality Date  . BRAIN SURGERY  2015   Brain surgery x 4 in 2015 at [place]    . CHOLECYSTECTOMY    . CYSTOSCOPY W/ URETERAL STENT PLACEMENT Right 10/26/2016   Procedure: CYSTOSCOPY WITH RETROGRADE PYELOGRAM/URETERAL STENT PLACEMENT;  Surgeon: [name], MD;   [facility];  Service: Urology;  Laterality: Right;  . CYSTOSCOPY WITH RETROGRADE PYELOGRAM, URETEROSCOPY AND STENT PLACEMENT Right 10/18/2014   Procedure: CYSTOSCOPY WITH RETROGRADE PYELOGRAM, URETEROSCOPY, STONE EXTRACTION AND STENT PLACEMENT;  Surgeon: Arvil Persons, MD;  [facility];  Service: Urology;  Laterality: Right;  . CYSTOSCOPY WITH RETROGRADE PYELOGRAM, URETEROSCOPY AND STENT PLACEMENT Right 10/29/2016   Procedure: CYSTOSCOPY WITH RETROGRADE PYELOGRAM, URETEROSCOPY AND STENT PLACEMENT;  Surgeon: [name], MD;  [facility];  Service: Urology;  Laterality: Right;  . CYSTOSTOMY W/ STENT INSERTION     numerous times  . HOLMIUM LASER APPLICATION Right [id number]   Procedure: HOLMIUM LASER APPLICATION;  Surgeon: Arvil Persons, MD;  [facility];  Service: Urology;  Laterality: Right;  . HOLMIUM LASER APPLICATION Right [id number]   Procedure: HOLMIUM LASER APPLICATION;  Surgeon: [name], MD;  [facility];  Service: Urology;  Laterality: Right;  . LASER ABLATION OF THE CERVIX      Home Medications:  (Not in a hospital admission)  Allergies:  Allergies  Allergen Reactions  . Penicillins Anaphylaxis    Has patient had a PCN reaction causing immediate rash, facial/tongue/throat swelling, SOB or lightheadedness with hypotension:  yes Has patient had a PCN reaction causing severe rash involving mucus membranes or skin necrosis:  no Has patient had a PCN reaction that required hospitalization: yes Has patient had  a PCN reaction occurring within the last 10 years: no If all of the above answers are "NO", then may proceed with Cephalosporin use.   . Albuterol Other (See Comments)    Jittery   . Codeine Hives  . Compazine [Prochlorperazine Edisylate]     Panic attacks  . Diclofenac Hives  . Midazolam Itching  . Zolpidem Other (See Comments)    Vivid nightmares/ sleep walking  . Betasept Surgical Scrub [Chlorhexidine Gluconate] Rash    Family  History  Problem Relation Age of Onset  . Allergies Father   . Heart disease Mother   . Clotting disorder Mother   . Heart disease Maternal Grandmother   . Heart disease Paternal Grandmother   . Cancer Other        grandparents  . Urticaria Neg Hx   . Immunodeficiency Neg Hx   . Eczema Neg Hx   . Atopy Neg Hx   . Asthma Neg Hx   . Angioedema Neg Hx   . Allergic rhinitis Neg Hx    Social History:  reports that she has never smoked. She has never used smokeless tobacco. She reports that she does not drink alcohol or use drugs.  ROS: A complete review of systems was performed.  All systems are negative except for pertinent findings as noted. Review of Systems  Constitutional: Positive for chills and malaise/fatigue.     Physical Exam:  Vital signs in last 24 hours: Temp:  [98.6 F (37 C)-100.1 F (37.8 C)] 100.1 F (37.8 C) (02/27 2117) Pulse Rate:  [90-106] 106 (02/27 2044) Resp:  [18] 18 (02/27 2044) BP: (129-145)/(74-89) 145/89 (02/27 2044) SpO2:  [94 %-99 %] 99 % (02/27 2044) General:  Alert and oriented, cushinoid appearance, No acute distress, nodding yes and no to husband, answering appropriately. Speech is garbled/dysarthric at baseline but husband it's a little worse tonight Cardiovascular: tachycardic  Lungs: slightly labored breathing on 2L [place] Abdomen: Soft, nontender, nondistended, no abdominal masses Back: No CVA tenderness Extremities: mild edema Neurologic: Grossly intact   Laboratory Data:  Results for orders placed or performed during the hospital encounter of 12/01/19 (from the past 24 hour(s))  CBC with Differential/Platelet     Status: Abnormal   Collection Time: 12/01/19  5:58 PM  Result Value Ref Range   WBC 11.0 (H) 4.0 - 10.5 K/uL   RBC 4.52 3.87 - 5.11 MIL/uL   Hemoglobin 13.7 12.0 - 15.0 g/dL   HCT 42.8 36.0 - 46.0 %   MCV 94.7 80.0 - 100.0 fL   MCH 30.3 26.0 - 34.0 pg   MCHC 32.0 30.0 - 36.0 g/dL   RDW 14.8 11.5 - 15.5 %   Platelets  138 (L) 150 - 400 K/uL   nRBC 0.0 0.0 - 0.2 %   Neutrophils Relative % 90 %   Neutro Abs 9.9 (H) 1.7 - 7.7 K/uL   Lymphocytes Relative 4 %   Lymphs Abs 0.5 (L) 0.7 - 4.0 K/uL   Monocytes Relative 5 %   Monocytes Absolute 0.6 0.1 - 1.0 K/uL   Eosinophils Relative 0 %   Eosinophils Absolute 0.0 0.0 - 0.5 K/uL   Basophils Relative 0 %   Basophils Absolute 0.0 0.0 - 0.1 K/uL   Immature Granulocytes 1 %   Abs Immature Granulocytes 0.10 (H) 0.00 - 0.07 K/uL  Comprehensive metabolic panel     Status: Abnormal   Collection Time: 12/01/19  5:58 PM  Result Value Ref Range   Sodium 139 135 -  145 mmol/L   Potassium 3.4 (L) 3.5 - 5.1 mmol/L   Chloride 102 98 - 111 mmol/L   CO2 24 22 - 32 mmol/L   Glucose, Bld 112 (H) 70 - 99 mg/dL   BUN 19 6 - 20 mg/dL   Creatinine, Ser 0.49 0.44 - 1.00 mg/dL   Calcium 9.2 8.9 - 10.3 mg/dL   Total Protein 7.2 6.5 - 8.1 g/dL   Albumin 4.0 3.5 - 5.0 g/dL   AST 19 15 - 41 U/L   ALT 44 0 - 44 U/L   Alkaline Phosphatase 48 38 - 126 U/L   Total Bilirubin 0.5 0.3 - 1.2 mg/dL   GFR calc non Af Amer >60 >60 mL/min   GFR calc Af Amer >60 >60 mL/min   Anion gap 13 5 - 15  Lipase, blood     Status: None   Collection Time: 12/01/19  5:58 PM  Result Value Ref Range   Lipase 25 11 - 51 U/L  hCG, quantitative, pregnancy     Status: None   Collection Time: 12/01/19  5:59 PM  Result Value Ref Range   hCG, Beta Chain, Quant, S <1 <5 mIU/mL  Urinalysis, Routine w reflex microscopic     Status: Abnormal   Collection Time: 12/01/19  9:15 PM  Result Value Ref Range   Color, Urine AMBER (A) YELLOW   APPearance CLOUDY (A) CLEAR   Specific Gravity, Urine 1.020 1.005 - 1.030   pH 6.0 5.0 - 8.0   Glucose, UA NEGATIVE NEGATIVE mg/dL   Hgb urine dipstick MODERATE (A) NEGATIVE   Bilirubin Urine NEGATIVE NEGATIVE   Ketones, ur 5 (A) NEGATIVE mg/dL   Protein, ur 30 (A) NEGATIVE mg/dL   Nitrite NEGATIVE NEGATIVE   Leukocytes,Ua SMALL (A) NEGATIVE   RBC / HPF 21-50 0 - 5  RBC/hpf   WBC, UA >50 (H) 0 - 5 WBC/hpf   Bacteria, UA MANY (A) NONE SEEN   WBC Clumps PRESENT    Mucus PRESENT    Hyaline Casts, UA PRESENT    Ca Oxalate Crys, UA PRESENT    No results found for this or any previous visit (from the past 240 hour(s)). Creatinine: Recent Labs    12/01/19 1758  CREATININE 0.49    Impression/Assessment/plan: Left ureteral stone, left hydronephrosis, UTI/sepsis-  I had a frank discussion with Nica and her husband.  She has a life-threatening condition.  We discussed the nature, potential benefits, risks and alternatives to cystoscopy with left ureteral stent placement, left retrograde pyelogram, including side effects of the proposed treatment, the likelihood of the patient achieving the goals of the procedure, and any potential problems that might occur during the procedure or recuperation.  We also discussed left percutaneous nephrostomy placement.  Genevia and her husband both elected to proceed with stent placement when they understand given her anatomy and prior history of aspiration and hypoxia, she may need an endotracheal tube and ventilation.  She may not wean off the ventilator.  We discussed it and again they do want to proceed with stent placement.  We discussed the rationale for a staged procedure with stent placement only this evening. All questions answered. Patient and her husband elect to proceed.    Festus Aloe 12/01/2019, 10:41 PM

## 2019-12-01 NOTE — ED Notes (Signed)
RN refrained from giving pt blanket due to her temp of 100.54F. Pt was notified.

## 2019-12-01 NOTE — Progress Notes (Signed)
[facility] Documentation  Pt called [facility] triage nurse with c/o left flank pain. Upon arrival, [place], Pincus Large, noted pt to be in extreme pain. Dilaudid po not controlling pain.   Liaison will follow pt in ED and if pt is admitted. Please call with any questions.   Thank you, Francessca January, RN [facility] Liaison [phone]

## 2019-12-01 NOTE — ED Notes (Signed)
Unable to 2nd set of cultures pt could not tolerate

## 2019-12-01 NOTE — ED Provider Notes (Addendum)
Assumed care from Dr. Wyvonnia Dusky at shift change.  See prior notes for full H&P.  Briefly, 44 year old female here with left-sided flank pain.  Found to have fever along with 2 stones in the left distal ureter with hydronephrosis.  Also some air which is concerning for infection.  UA does appear infectious.  Patient is currently on hospice due to brain and spinal cord tumor.  She is a DNR.  Plan: Neurology consult, will likely need hospitalist admission  10:07 PM Urology, Dr. Junious Silk-- will stent tonight, keep NPO.  Will need medical admission due to complex medical history.  Discussed with hospitalist, Dr. Flossie Buffy-- will admit for ongoing care.     Larene Pickett, PA-C 12/01/19 2312    Larene Pickett, PA-C 12/01/19 2319    Ezequiel Essex, MD 12/02/19 [phone]

## 2019-12-01 NOTE — ED Triage Notes (Signed)
EMS reports from home, c/o left flank pain since this morning. Hx of frequent kidney stones, Pt states pain is similar.  BP 127/73 HR 98 RR 18 Sp02 91 RA  Pt hospice for neoplasm. Has had the following today per hospice nurse:  Applied Fentanyl patch 75mg   Dilaudid dosages: 4mg  @ 0630 4mg  @ 1200 4mg  @ 1300 4mg  @ 1600  Husband Gerald Stabs [phone] Nurse [place] [phone]

## 2019-12-01 NOTE — H&P (Addendum)
History and Physical    [name] [id number] DOB: 10-03-1976 DOA: 12/01/2019  PCP: Carol Ada, MD  Patient coming from: Home with hospice  I have personally briefly reviewed patient's old medical records in [place]  Chief Complaint: Left flank pain  HPI: [name] is a 44 y.o. 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  Patient's husband at bedside provides the history as patient is obtunded. He says that this morning patient started to note acute left-sided flank pain and groin pain and knew that he felt similar to her previous kidney stones. She was able to tolerate some food today but had an episode of vomiting in the ER. Denies any fever. She normally takes as needed p.o. Dilaudid at home and reportedly had to take increased amount today without resolution of her pain. At the time my evaluation, husband notes that patient has had worsening decline in the past 30 minutes. She is having more difficulty speaking and having more labored and rhonchorous respirations.  Of note, patient says that she has had more declined the past 2 weeks and is now unable to move much of her left upper and lower extremity.  ED Course: She was febrile up to 100.1, tachycardic and hypertensive up to 160s over 85. She was placed on 2 L via nasal cannula at time of evaluation but had no documented hypoxia. She normally has as needed oxygen at home.  Labs showed leukocytosis of 11 K. Potassium of 3.4, glucose of 112. UA shows small leukocyte and negative nitrite, moderate hemoglobin and many bacteria.Creatinine of 0.49.  CT renal stone study showed two adjacent stone in the distal left ureter with mild left  hydronephrosis. There is small amount of air in the left renal upper pole collecting system.  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  ED physician Dr. Wyvonnia Dusky discussed with urology Dr. Junious Silk who will take her tonight for urgent stenting.IV Cipro to be started.   Review of Systems:  Unable to obtain given patient's obtundation  Past Medical History:  Diagnosis Date  . Anxiety   . Arthritis    arthritis in knees  . Asthma    asthma since a child. Well controlled  . Brain tumor ([place])   . Brain tumor, glioma ([place])   . 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 surgery x4 2015  . Kidney stone   . Migraine   . Pneumonia    last time 2 yr ago  . Recurrent upper respiratory infection (URI)   . Urticaria     Past Surgical History:  Procedure Laterality Date  . BRAIN SURGERY  2015   Brain surgery x 4 in 2015 at [facility]    . CHOLECYSTECTOMY    . CYSTOSCOPY W/ URETERAL STENT PLACEMENT Right 10/26/2016   Procedure: CYSTOSCOPY WITH RETROGRADE PYELOGRAM/URETERAL STENT PLACEMENT;  Surgeon: [name], MD;  Location: [facility];  Service: Urology;  Laterality: Right;  . CYSTOSCOPY WITH RETROGRADE PYELOGRAM, URETEROSCOPY AND STENT PLACEMENT Right 10/18/2014   Procedure: CYSTOSCOPY WITH RETROGRADE PYELOGRAM, URETEROSCOPY, STONE EXTRACTION AND STENT PLACEMENT;  Surgeon: Arvil Persons, MD;  Location: [facility];  Service: Urology;  Laterality: Right;  .  CYSTOSCOPY WITH RETROGRADE PYELOGRAM, URETEROSCOPY AND STENT PLACEMENT Right 10/29/2016   Procedure: CYSTOSCOPY WITH RETROGRADE PYELOGRAM, URETEROSCOPY AND STENT PLACEMENT;  Surgeon: [name], MD;  Location: [facility];  Service: Urology;  Laterality: Right;  . CYSTOSTOMY W/ STENT INSERTION     numerous times  . HOLMIUM LASER APPLICATION Right [id number]   Procedure: HOLMIUM LASER APPLICATION;  Surgeon: Arvil Persons, MD;  Location: [facility];   Service: Urology;  Laterality: Right;  . HOLMIUM LASER APPLICATION Right [id number]   Procedure: HOLMIUM LASER APPLICATION;  Surgeon: [name], MD;  Location: [facility];  Service: Urology;  Laterality: Right;  . LASER ABLATION OF THE CERVIX       reports that she has never smoked. She has never used smokeless tobacco. She reports that she does not drink alcohol or use drugs.  Allergies  Allergen Reactions  . Penicillins Anaphylaxis    Has patient had a PCN reaction causing immediate rash, facial/tongue/throat swelling, SOB or lightheadedness with hypotension:  yes Has patient had a PCN reaction causing severe rash involving mucus membranes or skin necrosis:  no Has patient had a PCN reaction that required hospitalization: yes Has patient had a PCN reaction occurring within the last 10 years: no If all of the above answers are "NO", then may proceed with Cephalosporin use.   . Albuterol Other (See Comments)    Jittery   . Codeine Hives  . Compazine [Prochlorperazine Edisylate]     Panic attacks  . Diclofenac Hives  . Midazolam Itching  . Zolpidem Other (See Comments)    Vivid nightmares/ sleep walking  . Betasept Surgical Scrub [Chlorhexidine Gluconate] Rash    Family History  Problem Relation Age of Onset  . Allergies Father   . Heart disease Mother   . Clotting disorder Mother   . Heart disease Maternal Grandmother   . Heart disease Paternal Grandmother   . Cancer Other        grandparents  . Urticaria Neg Hx   . Immunodeficiency Neg Hx   . Eczema Neg Hx   . Atopy Neg Hx   . Asthma Neg Hx   . Angioedema Neg Hx   . Allergic rhinitis Neg Hx      Prior to Admission medications   Medication Sig Start Date End Date Taking? Authorizing Provider  amphetamine-dextroamphetamine (ADDERALL XR) 20 MG 24 hr capsule Take 20 mg by mouth daily.    Yes [provider]  bisacodyl (DULCOLAX) 10 MG suppository Place 10-20 mg rectally as needed for moderate constipation.    Yes [provider]  budesonide (PULMICORT FLEXHALER) 180 MCG/ACT inhaler TAKE 2 PUFFS BY MOUTH TWICE A DAY Patient taking differently: Inhale 2 puffs into the lungs in the morning and at bedtime.  11/17/18  Yes Valentina Shaggy, MD  budesonide (RHINOCORT AQUA) 32 MCG/ACT nasal spray Place 1 spray into both nostrils daily. Patient taking differently: Place 2 sprays into both nostrils at bedtime.  04/03/18  Yes Valentina Shaggy, MD  cetirizine (ZYRTEC) 10 MG tablet TAKE 1 TABLET BY MOUTH ONCE DAILY FOR RUNNY NOSE/ITCHING Patient taking differently: Take 10 mg by mouth at bedtime.  07/24/18  Yes Valentina Shaggy, MD  Eszopiclone 3 MG TABS Take 3 mg by mouth daily at 10 pm. Reported on 01/23/2016   Yes [provider]  fentaNYL (DURAGESIC) 75 MCG/HR Place 1 patch onto the skin every 3 (three) days. 11/23/19  Yes [provider]  HYDROmorphone (DILAUDID) 4 MG tablet  Take 1-3 tablets every 6 hours as needed for pain Patient taking differently: Take 4 mg by mouth every 4 (four) hours as needed for moderate pain.  11/07/16  Yes Lolita Rieger, MD  ibuprofen (ADVIL,MOTRIN) 200 MG tablet Take 400 mg by mouth every 6 (six) hours as needed (pain).   Yes [provider]  lansoprazole (PREVACID) 15 MG capsule Take 15 mg by mouth 2 (two) times daily before a meal.   Yes [provider]  levalbuterol (XOPENEX HFA) 45 MCG/ACT inhaler Inhale 2 puffs into the lungs every 4 (four) hours as needed for wheezing or shortness of breath. 02/07/19  Yes Valentina Shaggy, MD  LIDOCAINE-PRILOCAINE EX Apply 1 application topically as needed (apply to port site before chemo).   Yes [provider]  loperamide (IMODIUM) 2 MG capsule Take 4 mg by mouth as needed for diarrhea or loose stools. 10/15/19  Yes [provider]  LORazepam (ATIVAN) 0.5 MG tablet Take 0.5 mg by mouth every 6 (six) hours as needed for anxiety.   Yes [provider]   mometasone (ASMANEX, 30 METERED DOSES,) 220 MCG/INH inhaler Inhale 2 puffs into the lungs daily. Patient taking differently: Inhale 2 puffs into the lungs daily as needed (wheezing).  11/12/19  Yes Valentina Shaggy, MD  OLANZapine (ZYPREXA) 10 MG tablet Take 10 mg by mouth at bedtime. 03/06/18  Yes [provider]  ondansetron (ZOFRAN) 8 MG tablet Take 8 mg by mouth every 8 (eight) hours as needed for nausea/vomiting. 11/13/19  Yes [provider]  OXYGEN Inhale 2 L into the lungs daily as needed (shortness of breath).   Yes [provider]  polyethylene glycol (MIRALAX / GLYCOLAX) packet Take 17 g by mouth 2 (two) times daily as needed.   Yes [provider]  predniSONE (DELTASONE) 20 MG tablet Take 10-20 mg by mouth 2 (two) times daily with a meal.    Yes [provider]  promethazine (PHENERGAN) 25 MG tablet Take 25 mg by mouth every 4 (four) hours as needed for nausea or vomiting.    Yes [provider]  senna-docusate (SENOKOT-S) 8.6-50 MG tablet Take 1 tablet by mouth 2 (two) times daily as needed for mild constipation.   Yes [provider]  sertraline (ZOLOFT) 25 MG tablet Take 50 mg by mouth daily. 09/21/19  Yes [provider]  tamsulosin (FLOMAX) 0.4 MG CAPS capsule Take 1 capsule (0.4 mg total) by mouth daily after supper. Patient not taking: Reported on 12/01/2019 06/17/17   Delia Heady, PA-C    Physical Exam: Vitals:   12/01/19 1804 12/01/19 1900 12/01/19 2044 12/01/19 2117  BP: 140/86 130/88 (!) 145/89   Pulse: 94 90 (!) 106   Resp: 18  18   Temp:    100.1 F (37.8 C)  TempSrc:    Rectal  SpO2: 96% 94% 99%     Constitutional: ill appearing, obtunded obese female laying flat in bed on her right side with bed soaked with vomitus.  Vitals:   12/01/19 1804 12/01/19 1900 12/01/19 2044 12/01/19 2117  BP: 140/86 130/88 (!) 145/89   Pulse: 94 90 (!) 106   Resp: 18  18   Temp:    100.1 F (37.8 C)   TempSrc:    Rectal  SpO2: 96% 94% 99%    Eyes: PERRL, lids and conjunctivae normal ENMT: Mucous membranes are moist.  Neck: normal, supple Respiratory: Rhonorous labored breathing on 2L via [place].  Cardiovascular: tacycardiac, no  murmurs / rubs / gallops. No extremity edema.  Abdomen: no tenderness, no masses palpated. No CVA tenderness. Bowel sounds positive.  Musculoskeletal: no clubbing / cyanosis. No joint deformity upper and lower extremities.  Skin: no rashes, lesions, ulcers. No induration Neurologic: Unable to fully assess given her she was obtunded. She was able to follow simple commands. Could open eyes and do right hand grip and move right foot. Unable to move left upper and lower extremity. Psychiatric: lethargic. Could not assess   Labs on Admission: I have personally reviewed following labs and imaging studies  CBC: Recent Labs  Lab 12/01/19 1758  WBC 11.0*  NEUTROABS 9.9*  HGB 13.7  HCT 42.8  MCV 94.7  PLT 0000000*   Basic Metabolic Panel: Recent Labs  Lab 12/01/19 1758  NA 139  K 3.4*  CL 102  CO2 24  GLUCOSE 112*  BUN 19  CREATININE 0.49  CALCIUM 9.2   GFR: CrCl cannot be calculated (Unknown ideal weight.). Liver Function Tests: Recent Labs  Lab 12/01/19 1758  AST 19  ALT 44  ALKPHOS 48  BILITOT 0.5  PROT 7.2  ALBUMIN 4.0   Recent Labs  Lab 12/01/19 1758  LIPASE 25   No results for input(s): AMMONIA in the last 168 hours. Coagulation Profile: No results for input(s): INR, PROTIME in the last 168 hours. Cardiac Enzymes: No results for input(s): CKTOTAL, CKMB, CKMBINDEX, TROPONINI in the last 168 hours. BNP (last 3 results) No results for input(s): PROBNP in the last 8760 hours. HbA1C: No results for input(s): HGBA1C in the last 72 hours. CBG: No results for input(s): GLUCAP in the last 168 hours. Lipid Profile: No results for input(s): CHOL, HDL, LDLCALC, TRIG, CHOLHDL, LDLDIRECT in the last 72 hours. Thyroid Function Tests: No  results for input(s): TSH, T4TOTAL, FREET4, T3FREE, THYROIDAB in the last 72 hours. Anemia Panel: No results for input(s): VITAMINB12, FOLATE, FERRITIN, TIBC, IRON, RETICCTPCT in the last 72 hours. Urine analysis:    Component Value Date/Time   COLORURINE AMBER (A) 12/01/2019 2115   APPEARANCEUR CLOUDY (A) 12/01/2019 2115   LABSPEC 1.020 12/01/2019 2115   PHURINE 6.0 12/01/2019 2115   GLUCOSEU NEGATIVE 12/01/2019 2115   HGBUR MODERATE (A) 12/01/2019 2115   BILIRUBINUR NEGATIVE 12/01/2019 2115   KETONESUR 5 (A) 12/01/2019 2115   PROTEINUR 30 (A) 12/01/2019 2115   UROBILINOGEN 0.2 10/06/2014 1509   NITRITE NEGATIVE 12/01/2019 2115   LEUKOCYTESUR SMALL (A) 12/01/2019 2115    Radiological Exams on Admission: CT Head Wo Contrast  Result Date: 12/01/2019 CLINICAL DATA:  Headache, history of brain tumor EXAM: CT HEAD WITHOUT CONTRAST TECHNIQUE: Contiguous axial images were obtained from the base of the skull through the vertex without intravenous contrast. COMPARISON:  06/05/2018 FINDINGS: Brain: No acute infarct or hemorrhage. Ventriculostomy catheter unchanged. Lateral ventricles are stable. Infiltrative mass within the brainstem again noted, compatible with known brainstem glioma. No significant change in the appearance on this unenhanced exam. No acute extra-axial fluid collections. Vascular: No hyperdense vessel or unexpected calcification. Skull: Postsurgical changes are seen from ventriculostomy catheter and occipital craniectomy. No acute fractures. Sinuses/Orbits: No acute finding. Other: None IMPRESSION: 1. Stable known brainstem glioma. 2. No acute infarct or hemorrhage. 3. Postsurgical changes as above. Electronically Signed   By: Randa Ngo M.D.   On: 12/01/2019 20:41   CT Renal Stone Study  Result Date: 12/01/2019 CLINICAL DATA:  44 year old female with flank pain. Kidney stone suspected. EXAM: CT ABDOMEN AND PELVIS WITHOUT CONTRAST TECHNIQUE: Multidetector CT  imaging of the  abdomen and pelvis was performed following the standard protocol without IV contrast. COMPARISON:  CT abdomen pelvis dated 03/11/2018. FINDINGS: Evaluation of this exam is limited in the absence of intravenous contrast. Lower chest: There are bibasilar linear atelectasis/scarring. A central venous line, likely a dual-lumen dialysis catheter partially visualized with tip close to the junction of the right atrium and IVC. A 4.0 x 2.5 cm partially visualized lobulated lesion adjacent to the aortic root, incompletely characterized but may represent a pericardial recess or cyst. No intra-abdominal free air. Trace free fluid in the pelvis. Hepatobiliary: The liver is grossly unremarkable. No intrahepatic biliary ductal dilatation. Cholecystectomy. Pancreas: Unremarkable. No pancreatic ductal dilatation or surrounding inflammatory changes. Spleen: Normal in size without focal abnormality. Adrenals/Urinary Tract: The adrenal glands are unremarkable. Two adjacent stones in the distal left ureter may each measuring approximately 7 mm. There is mild left hydronephrosis. Additional nonobstructing bilateral renal calculi noted. There is no hydronephrosis on the right. Small amount of air in the left renal upper pole collecting system may be related to recent instrumentation. Correlation with urinalysis recommended to exclude UTI. The urinary bladder is partially distended. There is air within the urinary bladder. Stomach/Bowel: There is a percutaneous gastrojejunostomy. There is no bowel obstruction or active inflammation. The appendix is normal. Vascular/Lymphatic: The abdominal aorta and IVC are unremarkable. No portal venous gas. There is no adenopathy. Reproductive: The uterus and ovaries are grossly unremarkable. Other: Partially visualized VP shunt with tip in the right lower quadrant. No fluid collection. Musculoskeletal: Osteopenia. No acute osseous pathology. IMPRESSION: 1. Two adjacent stones in the distal left ureter  with mild left hydronephrosis. Small amount of air in the left renal upper pole collecting system as well as within the urinary bladder may be related to recent instrumentation. Correlation with urinalysis recommended to exclude UTI. 2. No bowel obstruction. Normal appendix. Electronically Signed   By: Anner Crete M.D.   On: 12/01/2019 20:51      Assessment/Plan  Urosepsis in the setting of nephrolithasis Urology to take for urgent stent tonight admit to stepdown Continue IV Cipro continue IV fluids  Prn Dilaudid for pain  Acute hypoxic respiratory failure in the setting of urosepsis w/hx of moderate persistent asthma Does not appear in exacerbation Patient normally has as needed oxygen at home but this could be worsened due to her urosepsis and possible aspiration with history of dysphagia Obtain chest x-ray   Acute metabolic encephalopathy in the setting of urosepsis CT head shows stable astrocytoma Continue to monitor following uro stent and antibiotics  Infiltrative astrocytoma with extension to cervical spinal cord  under home hospice care since Dec 2020 Has had decline in the past 2 weeks- now with limited mobility of left upper and lower extremity Continue Fentanyl patch every 3 days  Hypokalemia replete through IV   DVT prophylaxis:.Lovenox Code Status: Full Family Communication: Plan discussed with patient at bedside  disposition Plan: Home with hospice with at least 2 midnight stays  Consults called:  Admission status: inpatient due to urosepsis with rapid clinical decline requiring urgent stenting  Orene Desanctis DO [facility]   If 7PM-7AM, please contact night-coverage [email or web address]   12/01/2019, 10:43 PM

## 2019-12-01 NOTE — ED Notes (Signed)
HCG BATH GIVEN BY TECH:

## 2019-12-01 NOTE — ED Notes (Signed)
Patient states that bladder feels full. Placed on purewick.

## 2019-12-02 ENCOUNTER — Inpatient Hospital Stay (HOSPITAL_COMMUNITY): Payer: BC Managed Care – PPO | Admitting: Certified Registered Nurse Anesthetist

## 2019-12-02 ENCOUNTER — Inpatient Hospital Stay (HOSPITAL_COMMUNITY): Payer: BC Managed Care – PPO

## 2019-12-02 ENCOUNTER — Encounter (HOSPITAL_COMMUNITY)
Admission: EM | Disposition: A | Discharge status: Hospice | Payer: Self-pay | Source: Home / Self Care | Attending: Internal Medicine

## 2019-12-02 HISTORY — PX: CYSTOSCOPY WITH STENT PLACEMENT: SHX5790

## 2019-12-02 LAB — BLOOD CULTURE ID PANEL (REFLEXED)

## 2019-12-02 LAB — BASIC METABOLIC PANEL
Anion gap: 14 (ref 5–15)
BUN: 18 mg/dL (ref 6–20)
CO2: 21 mmol/L — ABNORMAL LOW (ref 22–32)
Calcium: 8.3 mg/dL — ABNORMAL LOW (ref 8.9–10.3)
Chloride: 105 mmol/L (ref 98–111)
Creatinine, Ser: 0.72 mg/dL (ref 0.44–1.00)
GFR calc Af Amer: >60 mL/min (ref >60)
GFR calc non Af Amer: >60 mL/min (ref >60)
Glucose, Bld: 132 mg/dL — ABNORMAL HIGH (ref 70–99)
Potassium: 3 mmol/L — ABNORMAL LOW (ref 3.5–5.1)
Sodium: 140 mmol/L (ref 135–145)

## 2019-12-02 LAB — RESPIRATORY PANEL BY RT PCR (FLU A&B, COVID)
Influenza A by PCR: NEGATIVE
Influenza B by PCR: NEGATIVE
SARS Coronavirus 2 by RT PCR: NEGATIVE

## 2019-12-02 LAB — CBC
HCT: 39.1 % (ref 36.0–46.0)
Hemoglobin: 12.1 g/dL (ref 12.0–15.0)
MCH: 30.2 pg (ref 26.0–34.0)
MCHC: 30.9 g/dL (ref 30.0–36.0)
MCV: 97.5 fL (ref 80.0–100.0)
Platelets: 81 10*3/uL — ABNORMAL LOW (ref 150–400)
RBC: 4.01 MIL/uL (ref 3.87–5.11)
RDW: 15.2 % (ref 11.5–15.5)
WBC: 13.9 10*3/uL — ABNORMAL HIGH (ref 4.0–10.5)
nRBC: 0 % (ref 0.0–0.2)

## 2019-12-02 LAB — LACTIC ACID, PLASMA
Lactic Acid, Venous: 4.1 mmol/L (ref 0.5–1.9)
Lactic Acid, Venous: 3.3 mmol/L (ref 0.5–1.9)

## 2019-12-02 LAB — MRSA PCR SCREENING: MRSA by PCR: NEGATIVE

## 2019-12-02 LAB — PROCALCITONIN: Procalcitonin: 29.18 ng/mL

## 2019-12-02 LAB — HIV ANTIBODY (ROUTINE TESTING W REFLEX): HIV Screen 4th Generation wRfx: NONREACTIVE

## 2019-12-02 SURGERY — CYSTOSCOPY, WITH STENT INSERTION
Anesthesia: General | Laterality: Left

## 2019-12-02 MED ORDER — PANTOPRAZOLE SODIUM 40 MG PO TBEC: 40.0000 mg | DELAYED_RELEASE_TABLET | Freq: Every day | ORAL | Status: DC

## 2019-12-02 MED ORDER — BUDESONIDE 0.5 MG/2ML IN SUSP
0.5000 mg | Freq: Two times a day (BID) | RESPIRATORY_TRACT | Status: DC
  Administered 2019-12-02 – 2019-12-05 (×7): 0.5 mg via RESPIRATORY_TRACT
  Filled 2019-12-02 (×6): qty 2

## 2019-12-02 MED ORDER — OLANZAPINE 10 MG PO TABS: 10.0000 mg | ORAL_TABLET | Freq: Every day | ORAL | Status: DC

## 2019-12-02 MED ORDER — ONDANSETRON HCL 4 MG/2ML IJ SOLN
INTRAMUSCULAR | Status: AC
  Administered 2019-12-02: 05:00:00 4 mg via INTRAVENOUS
  Filled 2019-12-02: qty 2

## 2019-12-02 MED ORDER — SUCCINYLCHOLINE CHLORIDE 200 MG/10ML IV SOSY
PREFILLED_SYRINGE | INTRAVENOUS | Status: DC | PRN
  Administered 2019-12-02: 160 mg via INTRAVENOUS

## 2019-12-02 MED ORDER — SENNOSIDES-DOCUSATE SODIUM 8.6-50 MG PO TABS
1.0000 | ORAL_TABLET | Freq: Two times a day (BID) | ORAL | Status: DC
  Administered 2019-12-02: 1 via ORAL
  Filled 2019-12-02: qty 1

## 2019-12-02 MED ORDER — SERTRALINE HCL 50 MG PO TABS
50.0000 mg | ORAL_TABLET | Freq: Every day | ORAL | Status: DC
  Administered 2019-12-02: 09:00:00 50 mg via ORAL
  Filled 2019-12-02: qty 1

## 2019-12-02 MED ORDER — LORAZEPAM 0.5 MG PO TABS
0.5000 mg | ORAL_TABLET | Freq: Four times a day (QID) | ORAL | Status: DC | PRN
  Administered 2019-12-02 – 2019-12-04 (×4): 0.5 mg
  Filled 2019-12-02 (×4): qty 1

## 2019-12-02 MED ORDER — ONDANSETRON HCL 4 MG/2ML IJ SOLN: 4.0000 mg | Freq: Once | INTRAMUSCULAR | Status: AC | PRN

## 2019-12-02 MED ORDER — FENTANYL CITRATE (PF) 100 MCG/2ML IJ SOLN
INTRAMUSCULAR | Status: DC | PRN
  Administered 2019-12-02: 25 ug via INTRAVENOUS

## 2019-12-02 MED ORDER — PREDNISONE 10 MG PO TABS: 10.0000 mg | ORAL_TABLET | Freq: Two times a day (BID) | ORAL | Status: DC

## 2019-12-02 MED ORDER — HYDROMORPHONE HCL 2 MG PO TABS
4.0000 mg | ORAL_TABLET | Freq: Three times a day (TID) | ORAL | Status: DC | PRN
  Administered 2019-12-02: 09:00:00 4 mg via ORAL
  Filled 2019-12-02: qty 2

## 2019-12-02 MED ORDER — ONDANSETRON HCL 4 MG/2ML IJ SOLN
INTRAMUSCULAR | Status: AC
  Filled 2019-12-02: qty 2

## 2019-12-02 MED ORDER — TRAZODONE HCL 50 MG PO TABS
50.0000 mg | ORAL_TABLET | Freq: Once | ORAL | Status: AC
  Administered 2019-12-02: 22:00:00 50 mg via ORAL
  Filled 2019-12-02: qty 1

## 2019-12-02 MED ORDER — IOHEXOL 300 MG/ML  SOLN
INTRAMUSCULAR | Status: DC | PRN
  Administered 2019-12-02: 01:00:00 50 mL via ORAL

## 2019-12-02 MED ORDER — HYDROMORPHONE HCL 2 MG PO TABS
4.0000 mg | ORAL_TABLET | ORAL | Status: DC | PRN
  Administered 2019-12-02 – 2019-12-04 (×5): 4 mg
  Filled 2019-12-02 (×5): qty 2

## 2019-12-02 MED ORDER — PHENYLEPHRINE 40 MCG/ML (10ML) SYRINGE FOR IV PUSH (FOR BLOOD PRESSURE SUPPORT)
PREFILLED_SYRINGE | INTRAVENOUS | Status: DC | PRN
  Administered 2019-12-02: 120 ug via INTRAVENOUS

## 2019-12-02 MED ORDER — LORATADINE 10 MG PO TABS
10.0000 mg | ORAL_TABLET | Freq: Every day | ORAL | Status: DC
  Administered 2019-12-02: 09:00:00 10 mg via ORAL
  Filled 2019-12-02: qty 1

## 2019-12-02 MED ORDER — SODIUM CHLORIDE 0.9 % IV SOLN: INTRAVENOUS | Status: AC

## 2019-12-02 MED ORDER — PREDNISONE 10 MG PO TABS
10.0000 mg | ORAL_TABLET | Freq: Every day | ORAL | Status: DC
  Administered 2019-12-02 – 2019-12-05 (×4): 10 mg
  Filled 2019-12-02 (×5): qty 1

## 2019-12-02 MED ORDER — POTASSIUM CHLORIDE 10 MEQ/100ML IV SOLN
10.0000 meq | INTRAVENOUS | Status: AC
  Administered 2019-12-02 (×3): 10 meq via INTRAVENOUS
  Filled 2019-12-02 (×3): qty 100

## 2019-12-02 MED ORDER — SENNOSIDES-DOCUSATE SODIUM 8.6-50 MG PO TABS
1.0000 | ORAL_TABLET | Freq: Two times a day (BID) | ORAL | Status: DC
  Administered 2019-12-02 – 2019-12-05 (×5): 1
  Filled 2019-12-02 (×6): qty 1

## 2019-12-02 MED ORDER — SUCCINYLCHOLINE CHLORIDE 200 MG/10ML IV SOSY
PREFILLED_SYRINGE | INTRAVENOUS | Status: AC
  Filled 2019-12-02: qty 10

## 2019-12-02 MED ORDER — PREDNISONE 20 MG PO TABS
20.0000 mg | ORAL_TABLET | Freq: Every day | ORAL | Status: DC
  Administered 2019-12-02: 09:00:00 20 mg via ORAL
  Filled 2019-12-02: qty 1

## 2019-12-02 MED ORDER — TAMSULOSIN HCL 0.4 MG PO CAPS
0.4000 mg | ORAL_CAPSULE | Freq: Every day | ORAL | Status: DC
  Administered 2019-12-02 – 2019-12-04 (×3): 0.4 mg via ORAL
  Filled 2019-12-02 (×3): qty 1

## 2019-12-02 MED ORDER — DEXAMETHASONE SODIUM PHOSPHATE 10 MG/ML IJ SOLN
INTRAMUSCULAR | Status: AC
  Filled 2019-12-02: qty 1

## 2019-12-02 MED ORDER — PREDNISONE 20 MG PO TABS
20.0000 mg | ORAL_TABLET | Freq: Every day | ORAL | Status: DC
  Administered 2019-12-03 – 2019-12-05 (×3): 20 mg
  Filled 2019-12-02 (×3): qty 1

## 2019-12-02 MED ORDER — LACTATED RINGERS IV SOLN: INTRAVENOUS | Status: DC | PRN

## 2019-12-02 MED ORDER — OLANZAPINE 5 MG PO TABS
10.0000 mg | ORAL_TABLET | Freq: Every day | ORAL | Status: DC
  Administered 2019-12-02 – 2019-12-04 (×3): 10 mg
  Filled 2019-12-02: qty 1
  Filled 2019-12-02: qty 2
  Filled 2019-12-02 (×2): qty 1

## 2019-12-02 MED ORDER — LIDOCAINE 2% (20 MG/ML) 5 ML SYRINGE
INTRAMUSCULAR | Status: DC | PRN
  Administered 2019-12-02: 60 mg via INTRAVENOUS

## 2019-12-02 MED ORDER — DEXAMETHASONE SODIUM PHOSPHATE 4 MG/ML IJ SOLN
INTRAMUSCULAR | Status: DC | PRN
  Administered 2019-12-02: 10 mg via INTRAVENOUS

## 2019-12-02 MED ORDER — SODIUM CHLORIDE 0.9 % IV SOLN
2.0000 g | INTRAVENOUS | Status: DC
  Administered 2019-12-02 – 2019-12-04 (×3): 2 g via INTRAVENOUS
  Filled 2019-12-02 (×2): qty 20
  Filled 2019-12-02 (×2): qty 2
  Filled 2019-12-02: qty 20

## 2019-12-02 MED ORDER — FENTANYL CITRATE (PF) 100 MCG/2ML IJ SOLN
INTRAMUSCULAR | Status: AC
  Administered 2019-12-02: 02:00:00 25 ug via INTRAVENOUS
  Filled 2019-12-02: qty 2

## 2019-12-02 MED ORDER — ORAL CARE MOUTH RINSE
15.0000 mL | Freq: Two times a day (BID) | OROMUCOSAL | Status: DC
  Administered 2019-12-02 – 2019-12-05 (×5): 15 mL via OROMUCOSAL

## 2019-12-02 MED ORDER — LEVALBUTEROL TARTRATE 45 MCG/ACT IN AERO: 2.0000 | INHALATION_SPRAY | RESPIRATORY_TRACT | Status: DC | PRN

## 2019-12-02 MED ORDER — HYDROMORPHONE HCL 2 MG PO TABS: 4.0000 mg | ORAL_TABLET | Freq: Three times a day (TID) | ORAL | Status: DC | PRN

## 2019-12-02 MED ORDER — POLYETHYLENE GLYCOL 3350 17 G PO PACK
17.0000 g | PACK | Freq: Every day | ORAL | Status: DC
  Administered 2019-12-02: 17 g via ORAL
  Filled 2019-12-02: qty 1

## 2019-12-02 MED ORDER — PROPOFOL 10 MG/ML IV BOLUS
INTRAVENOUS | Status: DC | PRN
  Administered 2019-12-02: 170 mg via INTRAVENOUS

## 2019-12-02 MED ORDER — HYDROMORPHONE HCL 1 MG/ML IJ SOLN
1.0000 mg | INTRAMUSCULAR | Status: DC | PRN
  Administered 2019-12-02 – 2019-12-05 (×12): 1 mg via INTRAVENOUS
  Filled 2019-12-02 (×12): qty 1

## 2019-12-02 MED ORDER — PANTOPRAZOLE SODIUM 40 MG PO PACK
40.0000 mg | PACK | Freq: Every day | ORAL | Status: DC
  Administered 2019-12-02 – 2019-12-05 (×4): 40 mg
  Filled 2019-12-02 (×4): qty 20

## 2019-12-02 MED ORDER — CIPROFLOXACIN IN D5W 400 MG/200ML IV SOLN
400.0000 mg | Freq: Two times a day (BID) | INTRAVENOUS | Status: DC
  Administered 2019-12-02: 09:00:00 400 mg via INTRAVENOUS
  Filled 2019-12-02: qty 200

## 2019-12-02 MED ORDER — HYDROMORPHONE HCL 2 MG PO TABS: 4.0000 mg | ORAL_TABLET | ORAL | Status: DC | PRN

## 2019-12-02 MED ORDER — FENTANYL CITRATE (PF) 100 MCG/2ML IJ SOLN
25.0000 ug | INTRAMUSCULAR | Status: DC | PRN
  Administered 2019-12-02 (×3): 25 ug via INTRAVENOUS

## 2019-12-02 MED ORDER — PREDNISONE 10 MG PO TABS: 10.0000 mg | ORAL_TABLET | Freq: Every day | ORAL | Status: DC

## 2019-12-02 MED ORDER — SERTRALINE HCL 50 MG PO TABS
50.0000 mg | ORAL_TABLET | Freq: Every day | ORAL | Status: DC
  Administered 2019-12-03 – 2019-12-05 (×3): 50 mg
  Filled 2019-12-02 (×3): qty 1

## 2019-12-02 MED ORDER — PROMETHAZINE HCL 25 MG/ML IJ SOLN
12.5000 mg | Freq: Four times a day (QID) | INTRAMUSCULAR | Status: DC | PRN
  Administered 2019-12-02 – 2019-12-04 (×3): 12.5 mg via INTRAVENOUS
  Filled 2019-12-02 (×3): qty 1

## 2019-12-02 MED ORDER — FENTANYL 75 MCG/HR TD PT72
1.0000 | MEDICATED_PATCH | TRANSDERMAL | Status: DC
  Administered 2019-12-04: 1 via TRANSDERMAL
  Filled 2019-12-02: qty 1

## 2019-12-02 MED ORDER — STERILE WATER FOR IRRIGATION IR SOLN
Status: DC | PRN
  Administered 2019-12-02: 3000 mL

## 2019-12-02 MED ORDER — LORATADINE 10 MG PO TABS
10.0000 mg | ORAL_TABLET | Freq: Every day | ORAL | Status: DC
  Administered 2019-12-03 – 2019-12-05 (×3): 10 mg
  Filled 2019-12-02 (×3): qty 1

## 2019-12-02 MED ORDER — SODIUM CHLORIDE 0.9% FLUSH
10.0000 mL | INTRAVENOUS | Status: DC | PRN
  Administered 2019-12-04: 10 mL

## 2019-12-02 MED ORDER — LEVALBUTEROL HCL 0.63 MG/3ML IN NEBU: 0.6300 mg | INHALATION_SOLUTION | RESPIRATORY_TRACT | Status: DC | PRN

## 2019-12-02 MED ORDER — LORAZEPAM 0.5 MG PO TABS: 0.5000 mg | ORAL_TABLET | Freq: Four times a day (QID) | ORAL | Status: DC | PRN

## 2019-12-02 MED ORDER — FLUTICASONE PROPIONATE 50 MCG/ACT NA SUSP
1.0000 | Freq: Every day | NASAL | Status: DC
  Administered 2019-12-02 – 2019-12-05 (×4): 1 via NASAL
  Filled 2019-12-02: qty 16

## 2019-12-02 MED ORDER — POLYETHYLENE GLYCOL 3350 17 G PO PACK
17.0000 g | PACK | Freq: Every day | ORAL | Status: DC
  Administered 2019-12-03 – 2019-12-05 (×3): 17 g
  Filled 2019-12-02 (×2): qty 1

## 2019-12-02 SURGICAL SUPPLY — 14 items
BAG URO CATCHER STRL LF (MISCELLANEOUS) ×3 IMPLANT
BASKET ZERO TIP NITINOL 2.4FR (BASKET) IMPLANT
CATH INTERMIT  6FR 70CM (CATHETERS) ×3 IMPLANT
CLOTH BEACON ORANGE TIMEOUT ST (SAFETY) ×3 IMPLANT
GLOVE BIOGEL M STRL SZ7.5 (GLOVE) ×3 IMPLANT
GOWN STRL REUS W/TWL XL LVL3 (GOWN DISPOSABLE) ×3 IMPLANT
GUIDEWIRE ANG ZIPWIRE 038X150 (WIRE) IMPLANT
GUIDEWIRE STR DUAL SENSOR (WIRE) ×3 IMPLANT
KIT TURNOVER KIT A (KITS) IMPLANT
MANIFOLD NEPTUNE II (INSTRUMENTS) ×3 IMPLANT
PACK CYSTO (CUSTOM PROCEDURE TRAY) ×3 IMPLANT
STENT CONTOUR 6FRX26X.038 (STENTS) ×3 IMPLANT
TUBING CONNECTING 10 (TUBING) ×2 IMPLANT
TUBING CONNECTING 10' (TUBING) ×1

## 2019-12-02 NOTE — Transfer of Care (Signed)
Immediate Anesthesia Transfer of Care Note  Patient: [name]  Procedure(s) Performed: CYSTOSCOPY WITH STENT PLACEMENT (Left )  Patient Location: PACU  Anesthesia Type:General  Level of Consciousness: drowsy  Airway & Oxygen Therapy: Patient Spontanous Breathing and Patient connected to face mask  Post-op Assessment: Report given to RN and Post -op Vital signs reviewed and stable  Post vital signs: Reviewed and stable  Last Vitals:  Vitals Value Taken Time  BP 96/55 12/02/19 0142  Temp 37.6 C 12/02/19 0135  Pulse 140 12/02/19 0143  Resp 19 12/02/19 0143  SpO2 100 % 12/02/19 0143  Vitals shown include unvalidated device data.  Last Pain:  Vitals:   12/01/19 2117  TempSrc: Rectal  PainSc:          Complications: No apparent anesthesia complications

## 2019-12-02 NOTE — Progress Notes (Signed)
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 Karie Georges with [facility].  Mrs. Willig had been having increasing flank pain, increasing somnolence and fever.  EMS was called due to level of consciousness, hospice was notified prior to pt transporting to ED.  Pt is admitted with urosepsis secondary to nephrolithasis.  This is a related hospital admission.  Pt went to OR on 2/27 for left retrograde pyelogram and left uretal stent placement > SDU.  This morning she is alert, difficult to understand, laying RLL.  Her husband speaks on her behalf and can understand what she is saying.  She is without current complaint except requesting water, she is still NPO.  Possibly one more day in SDU, then transfer to floor.  V/S:  98.1 oral, 102/55, HR 119, RR 22, SPO2 99% on [place] 2 lpm I&O:  1900/350 Labs:  K 3.0, LA 3.3, PCT 29.18, WBC 13.9, plt 81, SARS coronavirus 2 negative Diagnostics:  CT renal stone study-Two adjacent stones in the distal left ureter with mild left hydronephrosis. Small amount of air in the left renal upper pole collecting system as well as within the urinary bladder may be related to recent instrumentation  IVs/PRNs:  Dilaudid 1 mg IV x 2, zofran 4 mg IV x 4, NS @ 75 ml/hr, cipro 400 mg IV BID, K Cl 10 mEq IC x 3, NS bolus 2L IV, fentanyl 25 mcg IV x 4  Problem List Urosepsis- IV abx, stent placed, IV fluids Astrocytoma of spinal cord- continue prednisone Hypokalemia- repleted  GOC- Clear, DNR, avoid future hospitalizations D/C planning- return home with hospice support once medically optimized IDT:  Updated Family: at bedside  Once pt is ready to discharge, please use ambulance transport as she cannot be safely transported POV anymore.  Please use [facility] as they contract this service for our active hospice pts.  Transfer summary and medication  list placed on the shadow chart.  Venia Carbon RN, BSN, [facility] Liaison (in [place]) [phone]

## 2019-12-02 NOTE — Progress Notes (Signed)
[facility]                                                                              Patient Demographics  [name], is a 44 y.o. female, DOB - 03/01/1976, PJS:[id number]  Admit date - 12/01/2019   Admitting Physician Orene Desanctis, DO  Outpatient Primary MD for the patient is Carol Ada, MD  Outpatient specialists:   LOS - 1  days   Medical records reviewed and are as summarized below:    Chief Complaint  Patient presents with  . 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  Patient was obtunded at the time of admission and hence history was obtained from the patient's husband who reported acute left-sided flank pain and groin pain similar to her previous kidney stones.  Also had an episode of vomiting in the ED, no fevers.  At the time of admission, had difficulty speaking, having more labored and rhonchorous respirations.  Also reported decline in the past 2 weeks and now unable to move much of her left side.  In ED, temp 100.1 F, tachycardiac, hypertensive, was placed on 2 L via nasal cannula, leukocytosis 11 K, potassium 3.4,  UA positive for UTI, creatinine 0.4  CT abdomen renal stone study showed 2 adjacent stones in the distal left ureter with mild left hydronephrosis  Assessment & Plan    Principal Problem:   Sepsis ([facility]) due to UTI, possible pyelonephritis, possibly infected distal left ureteral stone -CT abdomen renal stone study showed distal left ureteral stone with mild left hydronephrosis.  Patient met sepsis criteria at the time of admission secondary to fevers, tachycardia, lactic acidosis, leukocytosis, UTI with possible infected stone -Urology was  consulted, patient emergently underwent a cystoscopy with left retrograde pyelogram and left ureteral stent placement -Procalcitonin 29.1, follow blood cultures, urine culture -Much more alert and oriented today, continue IV ciprofloxacin (allergic to penicillins), follow sensitivities -Still complaining of significant pain 10/10 in the left flank area, continue fentanyl patch, oral Dilaudid 4 mg every 4 hours as needed, added IV Dilaudid for breakthrough pain -Continue tamsulosin  Active Problems:   Moderate persistent asthma without complication -No wheezing but has rhonchi, ?  Aspiration -States that she is on regular diet, will place on soft diet and obtain SLP evaluation -Has PEG tube -Restart Xopenex, budesonide, continue prednisone    Astrocytoma of spinal cord (HCC),  Grade II astrocytoma of brain (HCC) -Currently in home hospice, continue prednisone, antiemetics, outpatient follow-up with oncology -PT evaluation once able to tolerate    Hypokalemia -Replaced IV    Acute metabolic encephalopathy Likely due to #1, much more alert and oriented today  History of depression Continue sertraline  GERD Continue PPI  Code Status: DNR DVT Prophylaxis:   SCD's Family Communication: Discussed all imaging results, lab results, explained to the patient and husband on the phone   Disposition  Plan: Patient from home with hospice.  Anticipated discharge to home with hospice once cleared from urology, blood cultures, urine culture sensitivities are available, pain is tolerable   Time Spent in minutes 35 minutes  Procedures:   Cystoscopy with left retrograde pyelogram and left ureteral stent placement (12/02/2019, Dr. Junious Silk)  Consultants:   Urology  Antimicrobials:   Anti-infectives (From admission, onward)   Start     Dose/Rate Route Frequency Ordered Stop   12/02/19 1000  ciprofloxacin (CIPRO) IVPB 400 mg     400 mg 200 mL/hr over 60 Minutes Intravenous Every 12 hours  12/02/19 0140     12/01/19 2200  ciprofloxacin (CIPRO) IVPB 400 mg     400 mg 200 mL/hr over 60 Minutes Intravenous  Once 12/01/19 2154 12/02/19 0045          Medications  Scheduled Meds: . budesonide  0.5 mg Nebulization BID  . [START ON 12/04/2019] fentaNYL  1 patch Transdermal Q3 days  . fluticasone  1 spray Each Nare Daily  . [START ON 12/03/2019] loratadine  10 mg Per Tube Daily  . OLANZapine  10 mg Per Tube QHS  . ondansetron  4 mg Intravenous Once  . pantoprazole sodium  40 mg Per Tube Daily  . [START ON 12/03/2019] polyethylene glycol  17 g Per Tube Daily  . predniSONE  10 mg Per Tube Q1200  . [START ON 12/03/2019] predniSONE  20 mg Per Tube Q breakfast  . senna-docusate  1 tablet Per Tube BID  . [START ON 12/03/2019] sertraline  50 mg Per Tube Daily  . tamsulosin  0.4 mg Oral QPC supper   Continuous Infusions: . sodium chloride 100 mL/hr at 12/01/19 2344  . ciprofloxacin Stopped (12/02/19 1004)  . potassium chloride     PRN Meds:.HYDROmorphone (DILAUDID) injection, HYDROmorphone, levalbuterol, LORazepam, promethazine, sodium chloride flush      Subjective:   [name] was seen and examined today.  Much more alert and oriented today.  Left-sided weakness.  Still complaining of severe left flank pain,  no acute nausea or vomiting.  No fevers.  Objective:   Vitals:   12/02/19 0600 12/02/19 0700 12/02/19 0800 12/02/19 0900  BP: 115/73 105/69 (!) 102/55 (!) 107/55  Pulse:  (!) 118 (!) 119 (!) 118  Resp: 16 16 (!) 22 16  Temp:   98.1 F (36.7 C)   TempSrc:   Oral   SpO2:  97% 99% 99%    Intake/Output Summary (Last 24 hours) at 12/02/2019 1123 Last data filed at 12/02/2019 1000 Gross per 24 hour  Intake 3077.21 ml  Output 350 ml  Net 2727.21 ml     Wt Readings from Last 3 Encounters:  06/05/18 72.6 kg  04/03/18 78.8 kg  03/11/18 77.1 kg     Exam  General: Alert and oriented x 3, NAD  Cardiovascular: S1 S2 auscultated, no murmurs,  RRR  Respiratory: Bilateral scattered rhonchi  Gastrointestinal: Soft, nontender, nondistended, + bowel sounds, Gtube   Ext: no pedal edema bilaterally  Neuro: Left-sided weakness, per patient unable to move left side, RUE, RLE 5/5  Musculoskeletal: No digital cyanosis, clubbing  Skin: No rashes  Psych: Normal affect and demeanor, alert and oriented x3    Data Reviewed:  I have personally reviewed following labs and imaging studies  Micro Results Recent Results (from the past 240 hour(s))  Blood culture (routine x 2)     Status: None (Preliminary result)   Collection Time: 12/01/19  9:49 PM  Specimen: BLOOD LEFT ARM  Result Value Ref Range Status   Specimen Description   Final    BLOOD LEFT ARM Performed at [facility] [address], [place], [place] [address]    Special Requests   Final    BOTTLES DRAWN AEROBIC AND ANAEROBIC Blood Culture adequate volume Performed at [facility] [address], [place], [place] [address]    Culture  Setup Time   Final    GRAM NEGATIVE RODS IN BOTH AEROBIC AND ANAEROBIC BOTTLES Organism ID to follow Performed at [facility], [place] [address], [place], [place] [address]    Culture GRAM NEGATIVE RODS  Final   Report Status PENDING  Incomplete  Respiratory Panel by RT PCR (Flu A&B, Covid) - Nasopharyngeal Swab     Status: None   Collection Time: 12/01/19 10:08 PM   Specimen: Nasopharyngeal Swab  Result Value Ref Range Status   SARS Coronavirus 2 by RT PCR NEGATIVE NEGATIVE Final    Comment: (NOTE) SARS-CoV-2 target nucleic acids are NOT DETECTED. The SARS-CoV-2 RNA is generally detectable in upper respiratoy specimens during the acute phase of infection. The lowest concentration of SARS-CoV-2 viral copies this assay can detect is 131 copies/mL. A negative result does not preclude SARS-Cov-2 infection and should not be used as the sole basis for treatment or other patient management  decisions. A negative result may occur with  improper specimen collection/handling, submission of specimen other than nasopharyngeal swab, presence of viral mutation(s) within the areas targeted by this assay, and inadequate number of viral copies (<131 copies/mL). A negative result must be combined with clinical observations, patient history, and epidemiological information. The expected result is Negative. Fact Sheet for Patients:  PinkCheek.be Fact Sheet for Healthcare Providers:  GravelBags.it This test is not yet ap proved or cleared by the [place] FDA and  has been authorized for detection and/or diagnosis of SARS-CoV-2 by FDA under an Emergency Use Authorization (EUA). This EUA will remain  in effect (meaning this test can be used) for the duration of the COVID-19 declaration under Section 564(b)(1) of the Act, 21 U.S.C. section 360bbb-3(b)(1), unless the authorization is terminated or revoked sooner.    Influenza A by PCR NEGATIVE NEGATIVE Final   Influenza B by PCR NEGATIVE NEGATIVE Final    Comment: (NOTE) The Xpert Xpress SARS-CoV-2/FLU/RSV assay is intended as an aid in  the diagnosis of influenza from Nasopharyngeal swab specimens and  should not be used as a sole basis for treatment. Nasal washings and  aspirates are unacceptable for Xpert Xpress SARS-CoV-2/FLU/RSV  testing. Fact Sheet for Patients: PinkCheek.be Fact Sheet for Healthcare Providers: GravelBags.it This test is not yet approved or cleared by the [place] FDA and  has been authorized for detection and/or diagnosis of SARS-CoV-2 by  FDA under an Emergency Use Authorization (EUA). This EUA will remain  in effect (meaning this test can be used) for the duration of the  Covid-19 declaration under Section 564(b)(1) of the Act, 21  U.S.C. section 360bbb-3(b)(1), unless the authorization  is  terminated or revoked. Performed at [facility], [place] [address] [place], [place] [address]   MRSA PCR Screening     Status: None   Collection Time: 12/02/19  6:33 AM   Specimen: Nasopharyngeal  Result Value Ref Range Status   MRSA by PCR NEGATIVE NEGATIVE Final    Comment:        The GeneXpert MRSA Assay (FDA approved for NASAL  specimens only), is one component of a comprehensive MRSA colonization surveillance program. It is not intended to diagnose MRSA infection nor to guide or monitor treatment for MRSA infections. Performed at [facility], [place] [address], [place], [place] [address]     Radiology Reports CT Head Wo Contrast  Result Date: 12/01/2019 CLINICAL DATA:  Headache, history of brain tumor EXAM: CT HEAD WITHOUT CONTRAST TECHNIQUE: Contiguous axial images were obtained from the base of the skull through the vertex without intravenous contrast. COMPARISON:  06/05/2018 FINDINGS: Brain: No acute infarct or hemorrhage. Ventriculostomy catheter unchanged. Lateral ventricles are stable. Infiltrative mass within the brainstem again noted, compatible with known brainstem glioma. No significant change in the appearance on this unenhanced exam. No acute extra-axial fluid collections. Vascular: No hyperdense vessel or unexpected calcification. Skull: Postsurgical changes are seen from ventriculostomy catheter and occipital craniectomy. No acute fractures. Sinuses/Orbits: No acute finding. Other: None IMPRESSION: 1. Stable known brainstem glioma. 2. No acute infarct or hemorrhage. 3. Postsurgical changes as above. Electronically Signed   By: Randa Ngo M.D.   On: 12/01/2019 20:41   DG CHEST PORT 1 VIEW  Result Date: 12/02/2019 CLINICAL DATA:  Left flank pain since this morning, brainstem glioma EXAM: PORTABLE CHEST 1 VIEW COMPARISON:  06/05/2018 FINDINGS: Single frontal view of the chest demonstrates stable ventriculostomy catheter  overlying right chest. Left-sided chest wall port via internal jugular approach tip overlies the atrial caval junction. Cardiac silhouette is unremarkable. Lung volumes are diminished, with crowding of the central vasculature. No airspace disease, effusion, or pneumothorax. IMPRESSION: 1. Low lung volumes.  No acute airspace disease. Electronically Signed   By: Randa Ngo M.D.   On: 12/02/2019 00:07   DG C-Arm 1-60 Min-No Report  Result Date: 12/02/2019 Fluoroscopy was utilized by the requesting physician.  No radiographic interpretation.   CT Renal Stone Study  Result Date: 12/01/2019 CLINICAL DATA:  44 year old female with flank pain. Kidney stone suspected. EXAM: CT ABDOMEN AND PELVIS WITHOUT CONTRAST TECHNIQUE: Multidetector CT imaging of the abdomen and pelvis was performed following the standard protocol without IV contrast. COMPARISON:  CT abdomen pelvis dated 03/11/2018. FINDINGS: Evaluation of this exam is limited in the absence of intravenous contrast. Lower chest: There are bibasilar linear atelectasis/scarring. A central venous line, likely a dual-lumen dialysis catheter partially visualized with tip close to the junction of the right atrium and IVC. A 4.0 x 2.5 cm partially visualized lobulated lesion adjacent to the aortic root, incompletely characterized but may represent a pericardial recess or cyst. No intra-abdominal free air. Trace free fluid in the pelvis. Hepatobiliary: The liver is grossly unremarkable. No intrahepatic biliary ductal dilatation. Cholecystectomy. Pancreas: Unremarkable. No pancreatic ductal dilatation or surrounding inflammatory changes. Spleen: Normal in size without focal abnormality. Adrenals/Urinary Tract: The adrenal glands are unremarkable. Two adjacent stones in the distal left ureter may each measuring approximately 7 mm. There is mild left hydronephrosis. Additional nonobstructing bilateral renal calculi noted. There is no hydronephrosis on the right. Small  amount of air in the left renal upper pole collecting system may be related to recent instrumentation. Correlation with urinalysis recommended to exclude UTI. The urinary bladder is partially distended. There is air within the urinary bladder. Stomach/Bowel: There is a percutaneous gastrojejunostomy. There is no bowel obstruction or active inflammation. The appendix is normal. Vascular/Lymphatic: The abdominal aorta and IVC are unremarkable. No portal venous gas. There is no adenopathy. Reproductive: The uterus and ovaries are grossly unremarkable. Other: Partially visualized VP shunt with tip  in the right lower quadrant. No fluid collection. Musculoskeletal: Osteopenia. No acute osseous pathology. IMPRESSION: 1. Two adjacent stones in the distal left ureter with mild left hydronephrosis. Small amount of air in the left renal upper pole collecting system as well as within the urinary bladder may be related to recent instrumentation. Correlation with urinalysis recommended to exclude UTI. 2. No bowel obstruction. Normal appendix. Electronically Signed   By: Anner Crete M.D.   On: 12/01/2019 20:51    Lab Data:  CBC: Recent Labs  Lab 12/01/19 1758 12/02/19 0721  WBC 11.0* 13.9*  NEUTROABS 9.9*  --   HGB 13.7 12.1  HCT 42.8 39.1  MCV 94.7 97.5  PLT 138* 81*   Basic Metabolic Panel: Recent Labs  Lab 12/01/19 1758 12/02/19 0721  NA 139 140  K 3.4* 3.0*  CL 102 105  CO2 24 21*  GLUCOSE 112* 132*  BUN 19 18  CREATININE 0.49 0.72  CALCIUM 9.2 8.3*   GFR: CrCl cannot be calculated (Unknown ideal weight.). Liver Function Tests: Recent Labs  Lab 12/01/19 1758  AST 19  ALT 44  ALKPHOS 48  BILITOT 0.5  PROT 7.2  ALBUMIN 4.0   Recent Labs  Lab 12/01/19 1758  LIPASE 25   No results for input(s): AMMONIA in the last 168 hours. Coagulation Profile: No results for input(s): INR, PROTIME in the last 168 hours. Cardiac Enzymes: No results for input(s): CKTOTAL, CKMB, CKMBINDEX,  TROPONINI in the last 168 hours. BNP (last 3 results) No results for input(s): PROBNP in the last 8760 hours. HbA1C: No results for input(s): HGBA1C in the last 72 hours. CBG: No results for input(s): GLUCAP in the last 168 hours. Lipid Profile: No results for input(s): CHOL, HDL, LDLCALC, TRIG, CHOLHDL, LDLDIRECT in the last 72 hours. Thyroid Function Tests: No results for input(s): TSH, T4TOTAL, FREET4, T3FREE, THYROIDAB in the last 72 hours. Anemia Panel: No results for input(s): VITAMINB12, FOLATE, FERRITIN, TIBC, IRON, RETICCTPCT in the last 72 hours. Urine analysis:    Component Value Date/Time   COLORURINE AMBER (A) 12/01/2019 2115   APPEARANCEUR CLOUDY (A) 12/01/2019 2115   LABSPEC 1.020 12/01/2019 2115   PHURINE 6.0 12/01/2019 2115   GLUCOSEU NEGATIVE 12/01/2019 2115   HGBUR MODERATE (A) 12/01/2019 2115   BILIRUBINUR NEGATIVE 12/01/2019 2115   KETONESUR 5 (A) 12/01/2019 2115   PROTEINUR 30 (A) 12/01/2019 2115   UROBILINOGEN 0.2 10/06/2014 1509   NITRITE NEGATIVE 12/01/2019 2115   LEUKOCYTESUR SMALL (A) 12/01/2019 2115     Robi Mitter M.D. [facility] 12/02/2019, 11:23 AM   Call night coverage person covering after 7pm

## 2019-12-02 NOTE — Progress Notes (Signed)
Day of Surgery Subjective: Patient reports she is feeling better.   Objective: Vital signs in last 24 hours: Temp:  [98.1 F (36.7 C)-100.1 F (37.8 C)] 98.1 F (36.7 C) (02/28 0800) Pulse Rate:  [101-153] 113 (02/28 1825) Resp:  [15-26] 17 (02/28 1825) BP: (83-162)/(47-92) 125/51 (02/28 1825) SpO2:  [92 %-100 %] 92 % (02/28 1825)  Intake/Output from previous day: 02/27 0701 - 02/28 0700 In: 1900 [I.V.:1900] Out: 350 [Urine:350] Intake/Output this shift: No intake/output data recorded.   Intake/Output Summary (Last 24 hours) at 12/02/2019 1905 Last data filed at 12/02/2019 1800 Gross per 24 hour  Intake 3782.65 ml  Output 1700 ml  Net 2082.65 ml     Physical Exam:  She is more alert and talkative today Good uop - clear urine   Lab Results: Recent Labs    12/01/19 1758 12/02/19 0721  HGB 13.7 12.1  HCT 42.8 39.1   BMET Recent Labs    12/01/19 1758 12/02/19 0721  NA 139 140  K 3.4* 3.0*  CL 102 105  CO2 24 21*  GLUCOSE 112* 132*  BUN 19 18  CREATININE 0.49 0.72  CALCIUM 9.2 8.3*   No results for input(s): LABPT, INR in the last 72 hours. No results for input(s): LABURIN in the last 72 hours. Results for orders placed or performed during the hospital encounter of 12/01/19  Blood culture (routine x 2)     Status: None (Preliminary result)   Collection Time: 12/01/19  9:49 PM   Specimen: BLOOD LEFT ARM  Result Value Ref Range Status   Specimen Description   Final    BLOOD LEFT ARM Performed at [facility] [address], [place], [place] [address]    Special Requests   Final    BOTTLES DRAWN AEROBIC AND ANAEROBIC Blood Culture adequate volume Performed at [facility] [address] [place], [place] [address]    Culture  Setup Time   Final    GRAM NEGATIVE RODS IN BOTH AEROBIC AND ANAEROBIC BOTTLES Organism ID to follow CRITICAL RESULT CALLED TO, READ BACK BY AND VERIFIED WITH: PHARMD KIM HURGHT @1709   12/02/19 AKT Performed at [facility], [place] [address], [place], [place] [address]    Culture GRAM NEGATIVE RODS  Final   Report Status PENDING  Incomplete  Blood Culture ID Panel (Reflexed)     Status: Abnormal   Collection Time: 12/01/19  9:49 PM  Result Value Ref Range Status   Enterococcus species NOT DETECTED NOT DETECTED Final   Listeria monocytogenes NOT DETECTED NOT DETECTED Final   Staphylococcus species NOT DETECTED NOT DETECTED Final   Staphylococcus aureus (BCID) NOT DETECTED NOT DETECTED Final   Streptococcus species NOT DETECTED NOT DETECTED Final   Streptococcus agalactiae NOT DETECTED NOT DETECTED Final   Streptococcus pneumoniae NOT DETECTED NOT DETECTED Final   Streptococcus pyogenes NOT DETECTED NOT DETECTED Final   Acinetobacter baumannii NOT DETECTED NOT DETECTED Final   Enterobacteriaceae species DETECTED (A) NOT DETECTED Final    Comment: Enterobacteriaceae represent a large family of gram-negative bacteria, not a single organism. CRITICAL RESULT CALLED TO, READ BACK BY AND VERIFIED WITH: PHARMD KIM HURGHT @1709  12/02/19 AKT    Enterobacter cloacae complex NOT DETECTED NOT DETECTED Final   Escherichia coli NOT DETECTED NOT DETECTED Final   Klebsiella oxytoca NOT DETECTED NOT DETECTED Final   Klebsiella pneumoniae DETECTED (A) NOT DETECTED Final    Comment: CRITICAL RESULT CALLED TO, READ BACK BY AND VERIFIED  WITH: PHARMD KIM HURGHT @1709  12/02/2019 AKT    Proteus species NOT DETECTED NOT DETECTED Final   Serratia marcescens NOT DETECTED NOT DETECTED Final   Carbapenem resistance NOT DETECTED NOT DETECTED Final   Haemophilus influenzae NOT DETECTED NOT DETECTED Final   Neisseria meningitidis NOT DETECTED NOT DETECTED Final   Pseudomonas aeruginosa NOT DETECTED NOT DETECTED Final   Candida albicans NOT DETECTED NOT DETECTED Final   Candida glabrata NOT DETECTED NOT DETECTED Final   Candida krusei NOT DETECTED NOT DETECTED Final   Candida parapsilosis NOT  DETECTED NOT DETECTED Final   Candida tropicalis NOT DETECTED NOT DETECTED Final    Comment: Performed at [facility], [place] [address] [place], [place] [address]  Respiratory Panel by RT PCR (Flu A&B, Covid) - Nasopharyngeal Swab     Status: None   Collection Time: 12/01/19 10:08 PM   Specimen: Nasopharyngeal Swab  Result Value Ref Range Status   SARS Coronavirus 2 by RT PCR NEGATIVE NEGATIVE Final    Comment: (NOTE) SARS-CoV-2 target nucleic acids are NOT DETECTED. The SARS-CoV-2 RNA is generally detectable in upper respiratoy specimens during the acute phase of infection. The lowest concentration of SARS-CoV-2 viral copies this assay can detect is 131 copies/mL. A negative result does not preclude SARS-Cov-2 infection and should not be used as the sole basis for treatment or other patient management decisions. A negative result may occur with  improper specimen collection/handling, submission of specimen other than nasopharyngeal swab, presence of viral mutation(s) within the areas targeted by this assay, and inadequate number of viral copies (<131 copies/mL). A negative result must be combined with clinical observations, patient history, and epidemiological information. The expected result is Negative. Fact Sheet for Patients:  PinkCheek.be Fact Sheet for Healthcare Providers:  GravelBags.it This test is not yet ap proved or cleared by the [place] FDA and  has been authorized for detection and/or diagnosis of SARS-CoV-2 by FDA under an Emergency Use Authorization (EUA). This EUA will remain  in effect (meaning this test can be used) for the duration of the COVID-19 declaration under Section 564(b)(1) of the Act, 21 U.S.C. section 360bbb-3(b)(1), unless the authorization is terminated or revoked sooner.    Influenza A by PCR NEGATIVE NEGATIVE Final   Influenza B by PCR NEGATIVE NEGATIVE Final    Comment:  (NOTE) The Xpert Xpress SARS-CoV-2/FLU/RSV assay is intended as an aid in  the diagnosis of influenza from Nasopharyngeal swab specimens and  should not be used as a sole basis for treatment. Nasal washings and  aspirates are unacceptable for Xpert Xpress SARS-CoV-2/FLU/RSV  testing. Fact Sheet for Patients: PinkCheek.be Fact Sheet for Healthcare Providers: GravelBags.it This test is not yet approved or cleared by the [place] FDA and  has been authorized for detection and/or diagnosis of SARS-CoV-2 by  FDA under an Emergency Use Authorization (EUA). This EUA will remain  in effect (meaning this test can be used) for the duration of the  Covid-19 declaration under Section 564(b)(1) of the Act, 21  U.S.C. section 360bbb-3(b)(1), unless the authorization is  terminated or revoked. Performed at [facility], [place] [address], [place], [place] [address]   MRSA PCR Screening     Status: None   Collection Time: 12/02/19  6:33 AM   Specimen: Nasopharyngeal  Result Value Ref Range Status   MRSA by PCR NEGATIVE NEGATIVE Final    Comment:        The GeneXpert MRSA Assay (FDA approved for NASAL  specimens only), is one component of a comprehensive MRSA colonization surveillance program. It is not intended to diagnose MRSA infection nor to guide or monitor treatment for MRSA infections. Performed at [facility], [place] [address] [place], [place] [address]     Studies/Results: CT Head Wo Contrast  Result Date: 12/01/2019 CLINICAL DATA:  Headache, history of brain tumor EXAM: CT HEAD WITHOUT CONTRAST TECHNIQUE: Contiguous axial images were obtained from the base of the skull through the vertex without intravenous contrast. COMPARISON:  06/05/2018 FINDINGS: Brain: No acute infarct or hemorrhage. Ventriculostomy catheter unchanged. Lateral ventricles are stable. Infiltrative mass within the  brainstem again noted, compatible with known brainstem glioma. No significant change in the appearance on this unenhanced exam. No acute extra-axial fluid collections. Vascular: No hyperdense vessel or unexpected calcification. Skull: Postsurgical changes are seen from ventriculostomy catheter and occipital craniectomy. No acute fractures. Sinuses/Orbits: No acute finding. Other: None IMPRESSION: 1. Stable known brainstem glioma. 2. No acute infarct or hemorrhage. 3. Postsurgical changes as above. Electronically Signed   By: Randa Ngo M.D.   On: 12/01/2019 20:41   DG CHEST PORT 1 VIEW  Result Date: 12/02/2019 CLINICAL DATA:  Left flank pain since this morning, brainstem glioma EXAM: PORTABLE CHEST 1 VIEW COMPARISON:  06/05/2018 FINDINGS: Single frontal view of the chest demonstrates stable ventriculostomy catheter overlying right chest. Left-sided chest wall port via internal jugular approach tip overlies the atrial caval junction. Cardiac silhouette is unremarkable. Lung volumes are diminished, with crowding of the central vasculature. No airspace disease, effusion, or pneumothorax. IMPRESSION: 1. Low lung volumes.  No acute airspace disease. Electronically Signed   By: Randa Ngo M.D.   On: 12/02/2019 00:07   DG C-Arm 1-60 Min-No Report  Result Date: 12/02/2019 Fluoroscopy was utilized by the requesting physician.  No radiographic interpretation.   CT Renal Stone Study  Result Date: 12/01/2019 CLINICAL DATA:  44 year old female with flank pain. Kidney stone suspected. EXAM: CT ABDOMEN AND PELVIS WITHOUT CONTRAST TECHNIQUE: Multidetector CT imaging of the abdomen and pelvis was performed following the standard protocol without IV contrast. COMPARISON:  CT abdomen pelvis dated 03/11/2018. FINDINGS: Evaluation of this exam is limited in the absence of intravenous contrast. Lower chest: There are bibasilar linear atelectasis/scarring. A central venous line, likely a dual-lumen dialysis catheter  partially visualized with tip close to the junction of the right atrium and IVC. A 4.0 x 2.5 cm partially visualized lobulated lesion adjacent to the aortic root, incompletely characterized but may represent a pericardial recess or cyst. No intra-abdominal free air. Trace free fluid in the pelvis. Hepatobiliary: The liver is grossly unremarkable. No intrahepatic biliary ductal dilatation. Cholecystectomy. Pancreas: Unremarkable. No pancreatic ductal dilatation or surrounding inflammatory changes. Spleen: Normal in size without focal abnormality. Adrenals/Urinary Tract: The adrenal glands are unremarkable. Two adjacent stones in the distal left ureter may each measuring approximately 7 mm. There is mild left hydronephrosis. Additional nonobstructing bilateral renal calculi noted. There is no hydronephrosis on the right. Small amount of air in the left renal upper pole collecting system may be related to recent instrumentation. Correlation with urinalysis recommended to exclude UTI. The urinary bladder is partially distended. There is air within the urinary bladder. Stomach/Bowel: There is a percutaneous gastrojejunostomy. There is no bowel obstruction or active inflammation. The appendix is normal. Vascular/Lymphatic: The abdominal aorta and IVC are unremarkable. No portal venous gas. There is no adenopathy. Reproductive: The uterus and ovaries are grossly unremarkable. Other: Partially visualized VP shunt with tip in  the right lower quadrant. No fluid collection. Musculoskeletal: Osteopenia. No acute osseous pathology. IMPRESSION: 1. Two adjacent stones in the distal left ureter with mild left hydronephrosis. Small amount of air in the left renal upper pole collecting system as well as within the urinary bladder may be related to recent instrumentation. Correlation with urinalysis recommended to exclude UTI. 2. No bowel obstruction. Normal appendix. Electronically Signed   By: Anner Crete M.D.   On: 12/01/2019  20:51    Assessment/Plan:  Two left distal ureteral stones, sepsis  - improving after left stent earlier this morning. She is high risk for recurrent UTI with the stent (immobility, decreased po intake).   I will notify Dr. Louis Meckel of admission.    LOS: 1 day   Festus Aloe 12/02/2019, 7:04 PM

## 2019-12-02 NOTE — Op Note (Signed)
Preoperative diagnosis: Left ureteral stone, left hydronephrosis, sepsis Postoperative diagnosis: Same  Procedure: Cystoscopy with left retrograde pyelogram and left ureteral stent placement  Surgeon: Junious Silk  Anesthesia: General  Indication for procedure: [name] is a 44 year old female with sepsis and left distal ureteral stone and hydronephrosis.  She was brought for urgent stent.  Findings: On exam under anesthesia she had severe hemorrhoids.  Otherwise the vulva appeared normal.  On cystoscopy there was no stone or foreign body in the bladder.  The urethra and bladder were unremarkable.  Left retrograde pyelogram-single ureter single collecting system unit with two filling defects in the distal ureter and proximal dilation.  After placement of wire a good amount of purulent urine drained.  Description of procedure: After consent was obtained patient brought to the operating room.  After adequate anesthesia she was placed in lithotomy position and prepped and draped in the usual sterile fashion.  A timeout was performed to confirm the patient and procedure.  The cystoscope was passed per urethra and the bladder inspected.  The six [place] open-ended catheter was used to cannulate the left ureteral orifice and retrograde injection of contrast was performed.  I could not get a lot of contrast up into the mid ureter, therefore I passed the wire and and then passed the catheter into the mid ureter and remove the wire.  There was a hydronephrotic drip.  Second injection again confirmed dilation of the ureter and collecting system confirming proper placement of the wire.  A 6 x 26 cm stent was advanced.  The wire was removed with a good coil seen in the left upper pole calyx and a good coil in the bladder.  The scope was removed.  A 16 French Foley was placed in left to gravity drainage to max drain the system overnight.  She was awakened and taken to recovery room in stable condition although  tachycardic.  Complications: None  Blood loss: Minimal  Specimens: None  Drains: 6 x 26 cm left ureteral stent, 16 French Foley catheter  Disposition: Patient stable to PACU

## 2019-12-02 NOTE — Anesthesia Postprocedure Evaluation (Signed)
Anesthesia Post Note  Patient: [name]  Procedure(s) Performed: CYSTOSCOPY WITH STENT PLACEMENT (Left )     Patient location during evaluation: PACU Anesthesia Type: General Level of consciousness: sedated Pain management: pain level controlled Vital Signs Assessment: post-procedure vital signs reviewed and stable Respiratory status: spontaneous breathing, nonlabored ventilation, respiratory function stable and patient connected to nasal cannula oxygen Cardiovascular status: blood pressure returned to baseline and tachycardic Postop Assessment: no apparent nausea or vomiting Anesthetic complications: no    Last Vitals:  Vitals:   12/02/19 0530 12/02/19 0600  BP: 108/65 115/73  Pulse: (!) 124   Resp: 16 16  Temp: 37.4 C   SpO2: 99%     Last Pain:  Vitals:   12/02/19 0530  TempSrc:   PainSc: Asleep                 Catalina Gravel

## 2019-12-02 NOTE — Anesthesia Procedure Notes (Signed)
Procedure Name: Intubation Date/Time: 12/02/2019 1:04 AM Performed by: Claudia Desanctis, CRNA Pre-anesthesia Checklist: Emergency Drugs available, Suction available, Patient identified and Patient being monitored Patient Re-evaluated:Patient Re-evaluated prior to induction Oxygen Delivery Method: Circle system utilized Preoxygenation: Pre-oxygenation with 100% oxygen Induction Type: IV induction and Rapid sequence Laryngoscope Size: Glidescope and 4 Grade View: Grade I Tube type: Oral Number of attempts: 1 Airway Equipment and Method: Video-laryngoscopy Placement Confirmation: ETT inserted through vocal cords under direct vision,  positive ETCO2 and breath sounds checked- equal and bilateral Secured at: 21 cm Tube secured with: Tape Dental Injury: Teeth and Oropharynx as per pre-operative assessment

## 2019-12-02 NOTE — Anesthesia Preprocedure Evaluation (Addendum)
Anesthesia Evaluation  Patient identified by MRN, date of birth, ID band Patient confused  General Assessment Comment:Acute metabolic encephalopathy in the setting of urosepsis  Reviewed: Allergy & Precautions, NPO status , Patient's Chart, lab work & pertinent test results  Airway Mallampati: III  TM Distance: >3 FB Neck ROM: Full    Dental  (+) Teeth Intact, Dental Advisory Given   Pulmonary asthma ,    Pulmonary exam normal breath sounds clear to auscultation       Cardiovascular negative cardio ROS   Rhythm:Regular Rate:Tachycardia     Neuro/Psych  Headaches, PSYCHIATRIC DISORDERS Anxiety Astrocytoma s/p surgery, chemo, radiation     GI/Hepatic Neg liver ROS, GERD  Medicated,  Endo/Other  negative endocrine ROS  Renal/GU negative Renal ROSleft ureteral stone, UTI     Musculoskeletal  (+) Arthritis ,   Abdominal   Peds  Hematology  (+) Blood dyscrasia (Thrombocytopenia), ,   Anesthesia Other Findings Day of surgery medications reviewed with the patient.  Reproductive/Obstetrics                            Anesthesia Physical Anesthesia Plan  ASA: IV and emergent  Anesthesia Plan: General   Post-op Pain Management:    Induction: Intravenous, Rapid sequence and Cricoid pressure planned  PONV Risk Score and Plan: 4 or greater and Midazolam, Dexamethasone and Ondansetron  Airway Management Planned: Oral ETT and Video Laryngoscope Planned  Additional Equipment:   Intra-op Plan:   Post-operative Plan: Possible Post-op intubation/ventilation  Informed Consent: I have reviewed the patients History and Physical, chart, labs and discussed the procedure including the risks, benefits and alternatives for the proposed anesthesia with the patient or authorized representative who has indicated his/her understanding and acceptance.   Patient has DNR.  Discussed DNR with patient and  Suspend DNR.   Dental advisory given  Plan Discussed with: CRNA  Anesthesia Plan Comments: (Discussed anesthetic plan with patient and patient's husband.  DNR suspended for surgery. Patient agrees to possible temporary post-op intubation.)       Anesthesia Quick Evaluation

## 2019-12-02 NOTE — Discharge Instructions (Signed)
Ureteral Stent Implantation, Care After This sheet gives you information about how to care for yourself after your procedure. Your health care provider may also give you more specific instructions. If you have problems or questions, contact your health care provider.  Be sure to follow-up with Dr. Louis Meckel to plan stent/stone removal  What can I expect after the procedure? After the procedure, it is common to have:  Nausea.  Mild pain when you urinate. You may feel this pain in your lower back or lower abdomen. The pain should stop within a few minutes after you urinate. This may last for up to 1 week.  A small amount of blood in your urine for several days. Follow these instructions at home: Medicines  Take over-the-counter and prescription medicines only as told by your health care provider.  If you were prescribed an antibiotic medicine, take it as told by your health care provider. Do not stop taking the antibiotic even if you start to feel better.  Do not drive for 24 hours if you were given a sedative during your procedure.  Ask your health care provider if the medicine prescribed to you requires you to avoid driving or using heavy machinery. Activity  Rest as told by your health care provider.  Avoid sitting for a long time without moving. Get up to take short walks every 1-2 hours. This is important to improve blood flow and breathing. Ask for help if you feel weak or unsteady.  Return to your normal activities as told by your health care provider. Ask your health care provider what activities are safe for you. General instructions   Watch for any blood in your urine. Call your health care provider if the amount of blood in your urine increases.  If you have a catheter: ? Follow instructions from your health care provider about taking care of your catheter and collection bag. ? Do not take baths, swim, or use a hot tub until your health care provider approves. Ask your  health care provider if you may take showers. You may only be allowed to take sponge baths.  Drink enough fluid to keep your urine pale yellow.  Do not use any products that contain nicotine or tobacco, such as cigarettes, e-cigarettes, and chewing tobacco. These can delay healing after surgery. If you need help quitting, ask your health care provider.  Keep all follow-up visits as told by your health care provider. This is important. Contact a health care provider if:  You have pain that gets worse or does not get better with medicine, especially pain when you urinate.  You have difficulty urinating.  You feel nauseous or you vomit repeatedly during a period of more than 2 days after the procedure. Get help right away if:  Your urine is dark red or has blood clots in it.  You are leaking urine (have incontinence).  The end of the stent comes out of your urethra.  You cannot urinate.  You have sudden, sharp, or severe pain in your abdomen or lower back.  You have a fever.  You have swelling or pain in your legs.  You have difficulty breathing. Summary  After the procedure, it is common to have mild pain when you urinate that goes away within a few minutes after you urinate. This may last for up to 1 week.  Watch for any blood in your urine. Call your health care provider if the amount of blood in your urine increases.  Take over-the-counter  and prescription medicines only as told by your health care provider.  Drink enough fluid to keep your urine pale yellow. This information is not intended to replace advice given to you by your health care provider. Make sure you discuss any questions you have with your health care provider. Document Revised: 06/27/2018 Document Reviewed: 06/28/2018 Elsevier Patient Education  2020 [employer].

## 2019-12-02 NOTE — Progress Notes (Signed)
PHARMACY - PHYSICIAN COMMUNICATION CRITICAL VALUE ALERT - BLOOD CULTURE IDENTIFICATION (BCID)  [name] is an 44 y.o. female who presented to [facility] on 12/01/2019 with a chief complaint of L flank pain and groin pain.   S/p cystoscopy with L retrograde pyelogram and L ureteral stent placement on 2/28.  Assessment:  WBC increasing from 11 to 13.9 (on steroids). Scr 0.49>0.72. LA 4.1>3.3. Temp 100.1 - now improving. BCx showing GNR - BCID showing Kleb pneumoniae without KPC.   Has PCN allergy listed - has received ceftriaxone x1 in 2019 followed by cephalexin as an outpatient.   Name of physician (or Provider) Contacted: Dr Tana Coast  Current antibiotics: Ciprofloxacin 400 mg IV every 12 hours   Changes to prescribed antibiotics recommended:  Will change ciprofloxacin to ceftriaxone 2g IV every 24 hours as per BCID recommendations.   Results for orders placed or performed during the hospital encounter of 12/01/19  Blood Culture ID Panel (Reflexed) (Collected: 12/01/2019  9:49 PM)  Result Value Ref Range   Enterococcus species NOT DETECTED NOT DETECTED   Listeria monocytogenes NOT DETECTED NOT DETECTED   Staphylococcus species NOT DETECTED NOT DETECTED   Staphylococcus aureus (BCID) NOT DETECTED NOT DETECTED   Streptococcus species NOT DETECTED NOT DETECTED   Streptococcus agalactiae NOT DETECTED NOT DETECTED   Streptococcus pneumoniae NOT DETECTED NOT DETECTED   Streptococcus pyogenes NOT DETECTED NOT DETECTED   Acinetobacter baumannii NOT DETECTED NOT DETECTED   Enterobacteriaceae species DETECTED (A) NOT DETECTED   Enterobacter cloacae complex NOT DETECTED NOT DETECTED   Escherichia coli NOT DETECTED NOT DETECTED   Klebsiella oxytoca NOT DETECTED NOT DETECTED   Klebsiella pneumoniae DETECTED (A) NOT DETECTED   Proteus species NOT DETECTED NOT DETECTED   Serratia marcescens NOT DETECTED NOT DETECTED   Carbapenem resistance NOT DETECTED NOT DETECTED   Haemophilus  influenzae NOT DETECTED NOT DETECTED   Neisseria meningitidis NOT DETECTED NOT DETECTED   Pseudomonas aeruginosa NOT DETECTED NOT DETECTED   Candida albicans NOT DETECTED NOT DETECTED   Candida glabrata NOT DETECTED NOT DETECTED   Candida krusei NOT DETECTED NOT DETECTED   Candida parapsilosis NOT DETECTED NOT DETECTED   Candida tropicalis NOT DETECTED NOT DETECTED   Antonietta Jewel, PharmD, BCCCP Clinical Pharmacist  Phone: [phone]  Please check AMION for all [place] phone numbers After 10:00 PM, call [place] [phone] 12/02/2019  5:13 PM

## 2019-12-03 LAB — CBC
HCT: 34.3 % — ABNORMAL LOW (ref 36.0–46.0)
Hemoglobin: 10.7 g/dL — ABNORMAL LOW (ref 12.0–15.0)
MCH: 29.9 pg (ref 26.0–34.0)
MCHC: 31.2 g/dL (ref 30.0–36.0)
MCV: 95.8 fL (ref 80.0–100.0)
Platelets: 63 10*3/uL — ABNORMAL LOW (ref 150–400)
RBC: 3.58 MIL/uL — ABNORMAL LOW (ref 3.87–5.11)
RDW: 15 % (ref 11.5–15.5)
WBC: 10.7 10*3/uL — ABNORMAL HIGH (ref 4.0–10.5)
nRBC: 0 % (ref 0.0–0.2)

## 2019-12-03 LAB — BASIC METABOLIC PANEL
Anion gap: 9 (ref 5–15)
BUN: 16 mg/dL (ref 6–20)
CO2: 23 mmol/L (ref 22–32)
Calcium: 7.8 mg/dL — ABNORMAL LOW (ref 8.9–10.3)
Chloride: 107 mmol/L (ref 98–111)
Creatinine, Ser: 0.51 mg/dL (ref 0.44–1.00)
GFR calc Af Amer: >60 mL/min (ref >60)
GFR calc non Af Amer: >60 mL/min (ref >60)
Glucose, Bld: 105 mg/dL — ABNORMAL HIGH (ref 70–99)
Potassium: 3.1 mmol/L — ABNORMAL LOW (ref 3.5–5.1)
Sodium: 139 mmol/L (ref 135–145)

## 2019-12-03 LAB — LACTIC ACID, PLASMA: Lactic Acid, Venous: 0.8 mmol/L (ref 0.5–1.9)

## 2019-12-03 LAB — PROCALCITONIN: Procalcitonin: 15.42 ng/mL

## 2019-12-03 MED ORDER — POTASSIUM CHLORIDE 10 MEQ/100ML IV SOLN
10.0000 meq | INTRAVENOUS | Status: AC
  Administered 2019-12-03 (×3): 10 meq via INTRAVENOUS
  Filled 2019-12-03 (×3): qty 100

## 2019-12-03 NOTE — Plan of Care (Signed)

## 2019-12-03 NOTE — Progress Notes (Signed)
Pt c/o persistent nausea. Q6H phenergan ordered PRN. Patient states she also takes Zofran at home to manage persistent nausea. Dr. Tana Coast paged by RN requesting additional antiemetic. Awaiting further orders at this time. RN will continue to monitor pt response to interventions.

## 2019-12-03 NOTE — Progress Notes (Signed)
  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 Patient was  experiencing increased flank pain, increasing somnolence and fever. EMS was called due to level of consciousness then transported to the ED for evaluation on 12/01/19. [facility] was notified prior to transport to ED. Patient is admitted with urosepsis secondary to nephrolithiasis.    Checked in with bedside RN for report, who confirmed that plan is still for patient to transfer out of SDU today - awaiting available bed. Per RN, patient was able to tolerate bites of breakfast/yogurt but is currently nauseated without vomiting, managing pain continues to be an issue and that urinary output has been increasingly steady. Spoke with patient at bedside, who was somnolent yet answers questions with complaints of discomfort and restlessness which bedside RN is aware.  Supported patient and spouse at beside encouraging to reach out to [facility] HLT as needed leaving [facility] contact information in room. Made spouse aware that Progress will continue to follow patient daily during hospitalization.     V/S:  97.6, 115,128/58, 17 with O2 sats at 94% on 2L O2 via [place] I&O: 2960/1925 Abnormal lab work: (12/03/19) WBC 10.7, RBC 3.58, Hemo 10.7, HCT 34.3, Plate 63, K 3.1, Cal 7.8, Glucose 105, SARS Coronavirus 2 neg  Procedures: Cystoscopy with left retrograde pyelogram and left ureteral stent placement (12/02/2019, Dr. Junious Silk) - Urology continues to follow. Diagnostics:  No new tests today; Prior CT abdomen renal stone study (12/01/19) showed 2 adjacent stones in the distal left ureter with mild left hydronephrosis IVs IVF d/c'd, Rocephin 2g in NS 126ml IVPB q24hrs PRNs: Dilaudid inj 1mg  adm at 0116, 0912 and 1224 on 12/03/19, Ativan 0.5mg  tab PO adm 0116 on 31/21, Phenergan inj 12.5mg  at 1354   Problem list  via MD EPIC Notes 12/03/19:   Principal Problem:   Sepsis ([place]) due to UTI, possible pyelonephritis, infected distal left ureteral stone -Procalcitonin 29.1 -> 15.42 today -Lactic acid improving 3.3-> 0.8 today - blood cultures positive for Klebsiella pneumonia, urine culture Klebsiella pneumoni  Active Problems:   Moderate persistent asthma without complication -No wheezing but has rhonchi, ?  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[place]) -Currently in home hospice, continue prednisone, antiemetics, outpatient follow-up with oncology -PT evaluation once able to tolerate    Hypokalemia: Potassium 3.1, replaced   Acute metabolic encephalopathy: Likely due to #1, alert and awake today   D/C planning- Return home with hospice once medically stable - Please contact [facility] for all [facility] patient transfer needs upon discharge Goals of Care: clear - Patient is a DNR  Communication with IDT- [place] team updated on patient status Communication with [facility]- Supported patient spouse at bedside - feels good about current patient status   Please call with any hospice related questions/concerns,   Gar Ponto, RN [place] (in [place]) [phone]

## 2019-12-03 NOTE — Evaluation (Signed)
SLP Cancellation Note  Patient Details Name: [name] MRN: [id number] DOB: Feb 26, 1976   Cancelled treatment:       Reason Eval/Treat Not Completed: Other (comment)(per hospice representative note, pt somnolent, will continue efforts for evaluation, note pt have a PEG tube)   Macario Golds 12/03/2019, 5:10 PM

## 2019-12-03 NOTE — Progress Notes (Addendum)
[facility]                                                                              Patient Demographics  [name], is a 44 y.o. female, DOB - 1975/12/24, NUU:[id number]  Admit date - 12/01/2019   Admitting Physician Orene Desanctis, DO  Outpatient Primary MD for the patient is Carol Ada, MD  Outpatient specialists:   LOS - 2  days   Medical records reviewed and are as summarized below:    Chief Complaint  Patient presents with  . 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  Patient was obtunded at the time of admission and hence history was obtained from the patient's husband who reported acute left-sided flank pain and groin pain similar to her previous kidney stones.  Also had an episode of vomiting in the ED, no fevers.  At the time of admission, had difficulty speaking, having more labored and rhonchorous respirations.  Also reported decline in the past 2 weeks and now unable to move much of her left side.  In ED, temp 100.1 F, tachycardiac, hypertensive, was placed on 2 L via nasal cannula, leukocytosis 11 K, potassium 3.4,  UA positive for UTI, creatinine 0.4  CT abdomen renal stone study showed 2 adjacent stones in the distal left ureter with mild left hydronephrosis  Assessment & Plan    Principal Problem:   Sepsis ([place]) due to UTI, possible pyelonephritis, infected distal left ureteral stone -CT abdomen renal stone study showed distal left ureteral stone with mild left hydronephrosis.  Patient met sepsis criteria at the time of admission secondary to fevers, tachycardia, lactic acidosis, leukocytosis, UTI with possible infected stone -Urology was consulted,  patient emergently underwent a cystoscopy with left retrograde pyelogram and left ureteral stent placement -Procalcitonin 29.1 -> 15.42 today -Lactic acid improving 3.3-> 0.8 today - blood cultures positive for Klebsiella pneumonia, urine culture Klebsiella pneumonia -Changed ciprofloxacin to IV ceftriaxone 2 g every 24 hours, will follow sensitivities  Active Problems:   Moderate persistent asthma without complication -No wheezing but has rhonchi, ?  Aspiration -Continue soft diet, SLP evaluation -Has PEG tube -Continue Xopenex, budesonide, prednisone     Astrocytoma of spinal cord (HCC),  Grade II astrocytoma of brain (HCC) -Currently in home hospice, continue prednisone, antiemetics, outpatient follow-up with oncology -PT evaluation once able to tolerate    Hypokalemia -Potassium 3.1, replaced     Acute metabolic encephalopathy Likely due to #1, alert and awake today  History of depression Continue sertraline  GERD Continue PPI  Code Status: DNR DVT Prophylaxis:   SCD's Family Communication: Discussed all imaging results, lab results, explained to the patient.  Called patient's husband x2, unfortunately could not make contact today.   Disposition Plan: Patient from home with hospice.  Anticipated discharge to home with hospice once cleared from urology, blood cultures, urine culture sensitivities are  available, pain is tolerable   Time Spent in minutes 35 minutes  Procedures:   Cystoscopy with left retrograde pyelogram and left ureteral stent placement (12/02/2019, Dr. Junious Silk)  Consultants:   Urology  Antimicrobials:   Anti-infectives (From admission, onward)   Start     Dose/Rate Route Frequency Ordered Stop   12/02/19 1800  cefTRIAXone (ROCEPHIN) 2 g in sodium chloride 0.9 % 100 mL IVPB     2 g 200 mL/hr over 30 Minutes Intravenous Every 24 hours 12/02/19 1718     12/02/19 1000  ciprofloxacin (CIPRO) IVPB 400 mg  Status:  Discontinued     400 mg 200 mL/hr  over 60 Minutes Intravenous Every 12 hours 12/02/19 0140 12/02/19 1718   12/01/19 2200  ciprofloxacin (CIPRO) IVPB 400 mg     400 mg 200 mL/hr over 60 Minutes Intravenous  Once 12/01/19 2154 12/02/19 0045         Medications  Scheduled Meds: . budesonide  0.5 mg Nebulization BID  . [START ON 12/04/2019] fentaNYL  1 patch Transdermal Q3 days  . fluticasone  1 spray Each Nare Daily  . loratadine  10 mg Per Tube Daily  . mouth rinse  15 mL Mouth Rinse BID  . OLANZapine  10 mg Per Tube QHS  . ondansetron  4 mg Intravenous Once  . pantoprazole sodium  40 mg Per Tube Daily  . polyethylene glycol  17 g Per Tube Daily  . predniSONE  10 mg Per Tube Q1200  . predniSONE  20 mg Per Tube Q breakfast  . senna-docusate  1 tablet Per Tube BID  . sertraline  50 mg Per Tube Daily  . tamsulosin  0.4 mg Oral QPC supper   Continuous Infusions: . sodium chloride Stopped (12/03/19 1126)  . cefTRIAXone (ROCEPHIN)  IV Stopped (12/02/19 1805)   PRN Meds:.HYDROmorphone (DILAUDID) injection, HYDROmorphone, levalbuterol, LORazepam, promethazine, sodium chloride flush      Subjective:   [name] was seen and examined today.  Left-sided weakness, alert and awake.  Still has severe left flank pain.  No fevers or chills.  No acute nausea or vomiting.    Objective:   Vitals:   12/03/19 0914 12/03/19 1000 12/03/19 1100 12/03/19 1123  BP: (!) 128/58   (!) 148/119  Pulse: (!) 118 (!) 115 (!) 114 (!) 116  Resp: _0 Temp:      TempSrc:      SpO2: 96% 94% 93% 91%  Weight:      Height:        Intake/Output Summary (Last 24 hours) at 12/03/2019 1132 Last data filed at 12/03/2019 1000 Gross per 24 hour  Intake 2518.34 ml  Output 1925 ml  Net 593.34 ml     Wt Readings from Last 3 Encounters:  12/02/19 72.6 kg  06/05/18 72.6 kg  04/03/18 78.8 kg   Physical Exam  General: Alert and oriented x 3, NAD  Cardiovascular: S1 S2 clear, RRR. No pedal edema b/l  Respiratory:  Diminished breath sound at the bases  Gastrointestinal: Soft, nontender, nondistended, NBS, G-tube  Ext: no pedal edema bilaterally  Neuro: Left-sided weakness  Musculoskeletal: No cyanosis, clubbing  Skin: No rashes  Psych: Normal affect and demeanor, alert and oriented x3    Data Reviewed:  I have personally reviewed following labs and imaging studies  Micro Results Recent Results (from the past 240 hour(s))  Urine Culture     Status: Abnormal (Preliminary result)   Collection Time: 12/01/19  9:15 PM   Specimen: Urine, Random  Result Value Ref Range Status   Specimen Description   Final    URINE, RANDOM Performed at [facility] [address] [place], [place] [address]    Special Requests   Final    NONE Performed at [facility], [place] [address] [place], [place] [address]    Culture (A)  Final    >=100,000 COLONIES/mL KLEBSIELLA PNEUMONIAE SUSCEPTIBILITIES TO FOLLOW Performed at [facility], [place] [address], [place], [place] [address]    Report Status PENDING  Incomplete  Blood culture (routine x 2)     Status: Abnormal (Preliminary result)   Collection Time: 12/01/19  9:49 PM   Specimen: BLOOD LEFT ARM  Result Value Ref Range Status   Specimen Description   Final    BLOOD LEFT ARM Performed at [facility] [address], [place], [place] [address]    Special Requests   Final    BOTTLES DRAWN AEROBIC AND ANAEROBIC Blood Culture adequate volume Performed at [facility] [address] [place], [place] [address]    Culture  Setup Time   Final    GRAM NEGATIVE RODS IN BOTH AEROBIC AND ANAEROBIC BOTTLES CRITICAL RESULT CALLED TO, READ BACK BY AND VERIFIED WITH: PHARMD KIM HURGHT _0  12/02/19 AKT    Culture (A)  Final    KLEBSIELLA PNEUMONIAE SUSCEPTIBILITIES TO FOLLOW Performed at [facility], [place] [address], [place], [place] [address]    Report Status PENDING   Incomplete  Blood Culture ID Panel (Reflexed)     Status: Abnormal   Collection Time: 12/01/19  9:49 PM  Result Value Ref Range Status   Enterococcus species NOT DETECTED NOT DETECTED Final   Listeria monocytogenes NOT DETECTED NOT DETECTED Final   Staphylococcus species NOT DETECTED NOT DETECTED Final   Staphylococcus aureus (BCID) NOT DETECTED NOT DETECTED Final   Streptococcus species NOT DETECTED NOT DETECTED Final   Streptococcus agalactiae NOT DETECTED NOT DETECTED Final   Streptococcus pneumoniae NOT DETECTED NOT DETECTED Final   Streptococcus pyogenes NOT DETECTED NOT DETECTED Final   Acinetobacter baumannii NOT DETECTED NOT DETECTED Final   Enterobacteriaceae species DETECTED (A) NOT DETECTED Final    Comment: Enterobacteriaceae represent a large family of gram-negative bacteria, not a single organism. CRITICAL RESULT CALLED TO, READ BACK BY AND VERIFIED WITH: PHARMD KIM HURGHT _1  12/02/19 AKT    Enterobacter cloacae complex NOT DETECTED NOT DETECTED Final   Escherichia coli NOT DETECTED NOT DETECTED Final   Klebsiella oxytoca NOT DETECTED NOT DETECTED Final   Klebsiella pneumoniae DETECTED (A) NOT DETECTED Final    Comment: CRITICAL RESULT CALLED TO, READ BACK BY AND VERIFIED WITH: PHARMD KIM HURGHT _2  12/02/2019 AKT    Proteus species NOT DETECTED NOT DETECTED Final   Serratia marcescens NOT DETECTED NOT DETECTED Final   Carbapenem resistance NOT DETECTED NOT DETECTED Final   Haemophilus influenzae NOT DETECTED NOT DETECTED Final   Neisseria meningitidis NOT DETECTED NOT DETECTED Final   Pseudomonas aeruginosa NOT DETECTED NOT DETECTED Final   Candida albicans NOT DETECTED NOT DETECTED Final   Candida glabrata NOT DETECTED NOT DETECTED Final   Candida krusei NOT DETECTED NOT DETECTED Final   Candida parapsilosis NOT DETECTED NOT DETECTED Final   Candida tropicalis NOT DETECTED NOT DETECTED Final    Comment: Performed at [facility], [place] [address]  [place], [place] [address]  Respiratory Panel by RT PCR (  Flu A&B, Covid) - Nasopharyngeal Swab     Status: None   Collection Time: 12/01/19 10:08 PM   Specimen: Nasopharyngeal Swab  Result Value Ref Range Status   SARS Coronavirus 2 by RT PCR NEGATIVE NEGATIVE Final    Comment: (NOTE) SARS-CoV-2 target nucleic acids are NOT DETECTED. The SARS-CoV-2 RNA is generally detectable in upper respiratoy specimens during the acute phase of infection. The lowest concentration of SARS-CoV-2 viral copies this assay can detect is 131 copies/mL. A negative result does not preclude SARS-Cov-2 infection and should not be used as the sole basis for treatment or other patient management decisions. A negative result may occur with  improper specimen collection/handling, submission of specimen other than nasopharyngeal swab, presence of viral mutation(s) within the areas targeted by this assay, and inadequate number of viral copies (<131 copies/mL). A negative result must be combined with clinical observations, patient history, and epidemiological information. The expected result is Negative. Fact Sheet for Patients:  PinkCheek.be Fact Sheet for Healthcare Providers:  GravelBags.it This test is not yet ap proved or cleared by the [place] FDA and  has been authorized for detection and/or diagnosis of SARS-CoV-2 by FDA under an Emergency Use Authorization (EUA). This EUA will remain  in effect (meaning this test can be used) for the duration of the COVID-19 declaration under Section 564(b)(1) of the Act, 21 U.S.C. section 360bbb-3(b)(1), unless the authorization is terminated or revoked sooner.    Influenza A by PCR NEGATIVE NEGATIVE Final   Influenza B by PCR NEGATIVE NEGATIVE Final    Comment: (NOTE) The Xpert Xpress SARS-CoV-2/FLU/RSV assay is intended as an aid in  the diagnosis of influenza from Nasopharyngeal swab specimens and  should not  be used as a sole basis for treatment. Nasal washings and  aspirates are unacceptable for Xpert Xpress SARS-CoV-2/FLU/RSV  testing. Fact Sheet for Patients: PinkCheek.be Fact Sheet for Healthcare Providers: GravelBags.it This test is not yet approved or cleared by the [place] FDA and  has been authorized for detection and/or diagnosis of SARS-CoV-2 by  FDA under an Emergency Use Authorization (EUA). This EUA will remain  in effect (meaning this test can be used) for the duration of the  Covid-19 declaration under Section 564(b)(1) of the Act, 21  U.S.C. section 360bbb-3(b)(1), unless the authorization is  terminated or revoked. Performed at [facility], [place] [address] [place], [place] [address]   MRSA PCR Screening     Status: None   Collection Time: 12/02/19  6:33 AM   Specimen: Nasopharyngeal  Result Value Ref Range Status   MRSA by PCR NEGATIVE NEGATIVE Final    Comment:        The GeneXpert MRSA Assay (FDA approved for NASAL specimens only), is one component of a comprehensive MRSA colonization surveillance program. It is not intended to diagnose MRSA infection nor to guide or monitor treatment for MRSA infections. Performed at [facility], [place] [address] [place], [place] [address]   Blood culture (routine x 2)     Status: None (Preliminary result)   Collection Time: 12/02/19  7:21 AM   Specimen: BLOOD  Result Value Ref Range Status   Specimen Description   Final    BLOOD RIGHT ARM Performed at [facility] [address], [place], [place] [address]    Special Requests   Final    BOTTLES DRAWN AEROBIC AND ANAEROBIC Blood Culture adequate volume Performed at [facility] [address]  [place], [place] [address]    Culture   Final    NO GROWTH < 24 HOURS Performed at [facility], [place] [address], [place],   [address]    Report Status PENDING  Incomplete    Radiology Reports CT Head Wo Contrast  Result Date: 12/01/2019 CLINICAL DATA:  Headache, history of brain tumor EXAM: CT HEAD WITHOUT CONTRAST TECHNIQUE: Contiguous axial images were obtained from the base of the skull through the vertex without intravenous contrast. COMPARISON:  06/05/2018 FINDINGS: Brain: No acute infarct or hemorrhage. Ventriculostomy catheter unchanged. Lateral ventricles are stable. Infiltrative mass within the brainstem again noted, compatible with known brainstem glioma. No significant change in the appearance on this unenhanced exam. No acute extra-axial fluid collections. Vascular: No hyperdense vessel or unexpected calcification. Skull: Postsurgical changes are seen from ventriculostomy catheter and occipital craniectomy. No acute fractures. Sinuses/Orbits: No acute finding. Other: None IMPRESSION: 1. Stable known brainstem glioma. 2. No acute infarct or hemorrhage. 3. Postsurgical changes as above. Electronically Signed   By: Randa Ngo M.D.   On: 12/01/2019 20:41   DG CHEST PORT 1 VIEW  Result Date: 12/02/2019 CLINICAL DATA:  Left flank pain since this morning, brainstem glioma EXAM: PORTABLE CHEST 1 VIEW COMPARISON:  06/05/2018 FINDINGS: Single frontal view of the chest demonstrates stable ventriculostomy catheter overlying right chest. Left-sided chest wall port via internal jugular approach tip overlies the atrial caval junction. Cardiac silhouette is unremarkable. Lung volumes are diminished, with crowding of the central vasculature. No airspace disease, effusion, or pneumothorax. IMPRESSION: 1. Low lung volumes.  No acute airspace disease. Electronically Signed   By: Randa Ngo M.D.   On: 12/02/2019 00:07   DG C-Arm 1-60 Min-No Report  Result Date: 12/02/2019 Fluoroscopy was utilized by the requesting physician.  No radiographic interpretation.   CT Renal Stone Study  Result Date: 12/01/2019 CLINICAL DATA:   44 year old female with flank pain. Kidney stone suspected. EXAM: CT ABDOMEN AND PELVIS WITHOUT CONTRAST TECHNIQUE: Multidetector CT imaging of the abdomen and pelvis was performed following the standard protocol without IV contrast. COMPARISON:  CT abdomen pelvis dated 03/11/2018. FINDINGS: Evaluation of this exam is limited in the absence of intravenous contrast. Lower chest: There are bibasilar linear atelectasis/scarring. A central venous line, likely a dual-lumen dialysis catheter partially visualized with tip close to the junction of the right atrium and IVC. A 4.0 x 2.5 cm partially visualized lobulated lesion adjacent to the aortic root, incompletely characterized but may represent a pericardial recess or cyst. No intra-abdominal free air. Trace free fluid in the pelvis. Hepatobiliary: The liver is grossly unremarkable. No intrahepatic biliary ductal dilatation. Cholecystectomy. Pancreas: Unremarkable. No pancreatic ductal dilatation or surrounding inflammatory changes. Spleen: Normal in size without focal abnormality. Adrenals/Urinary Tract: The adrenal glands are unremarkable. Two adjacent stones in the distal left ureter may each measuring approximately 7 mm. There is mild left hydronephrosis. Additional nonobstructing bilateral renal calculi noted. There is no hydronephrosis on the right. Small amount of air in the left renal upper pole collecting system may be related to recent instrumentation. Correlation with urinalysis recommended to exclude UTI. The urinary bladder is partially distended. There is air within the urinary bladder. Stomach/Bowel: There is a percutaneous gastrojejunostomy. There is no bowel obstruction or active inflammation. The appendix is normal. Vascular/Lymphatic: The abdominal aorta and IVC are unremarkable. No portal venous gas. There is no adenopathy. Reproductive: The uterus and ovaries are grossly unremarkable. Other: Partially visualized VP shunt with tip in the right lower  quadrant. No fluid collection. Musculoskeletal: Osteopenia. No acute osseous pathology. IMPRESSION: 1. Two adjacent stones in the distal left ureter with mild left hydronephrosis. Small amount of air in the left renal upper pole collecting system as well as within the urinary bladder may be related to recent instrumentation. Correlation with urinalysis recommended to exclude UTI. 2. No bowel obstruction. Normal appendix. Electronically Signed   By: Anner Crete M.D.   On: 12/01/2019 20:51    Lab Data:  CBC: Recent Labs  Lab 12/01/19 1758 12/02/19 0721 12/03/19 0352  WBC 11.0* 13.9* 10.7*  NEUTROABS 9.9*  --   --   HGB 13.7 12.1 10.7*  HCT 42.8 39.1 34.3*  MCV 94.7 97.5 95.8  PLT 138* 81* 63*   Basic Metabolic Panel: Recent Labs  Lab 12/01/19 1758 12/02/19 0721 12/03/19 0352  NA 139 140 139  K 3.4* 3.0* 3.1*  CL 102 105 107  CO2 24 21* 23  GLUCOSE 112* 132* 105*  BUN _0 CREATININE 0.49 0.72 0.51  CALCIUM 9.2 8.3* 7.8*   GFR: Estimated Creatinine Clearance: 92.8 mL/min (by C-G formula based on SCr of 0.51 mg/dL). Liver Function Tests: Recent Labs  Lab 12/01/19 1758  AST 19  ALT 44  ALKPHOS 48  BILITOT 0.5  PROT 7.2  ALBUMIN 4.0   Recent Labs  Lab 12/01/19 1758  LIPASE 25   No results for input(s): AMMONIA in the last 168 hours. Coagulation Profile: No results for input(s): INR, PROTIME in the last 168 hours. Cardiac Enzymes: No results for input(s): CKTOTAL, CKMB, CKMBINDEX, TROPONINI in the last 168 hours. BNP (last 3 results) No results for input(s): PROBNP in the last 8760 hours. HbA1C: No results for input(s): HGBA1C in the last 72 hours. CBG: No results for input(s): GLUCAP in the last 168 hours. Lipid Profile: No results for input(s): CHOL, HDL, LDLCALC, TRIG, CHOLHDL, LDLDIRECT in the last 72 hours. Thyroid Function Tests: No results for input(s): TSH, T4TOTAL, FREET4, T3FREE, THYROIDAB in the last 72 hours. Anemia Panel: No results  for input(s): VITAMINB12, FOLATE, FERRITIN, TIBC, IRON, RETICCTPCT in the last 72 hours. Urine analysis:    Component Value Date/Time   COLORURINE AMBER (A) 12/01/2019 2115   APPEARANCEUR CLOUDY (A) 12/01/2019 2115   LABSPEC 1.020 12/01/2019 2115   PHURINE 6.0 12/01/2019 2115   GLUCOSEU NEGATIVE 12/01/2019 2115   HGBUR MODERATE (A) 12/01/2019 2115   BILIRUBINUR NEGATIVE 12/01/2019 2115   KETONESUR 5 (A) 12/01/2019 2115   PROTEINUR 30 (A) 12/01/2019 2115   UROBILINOGEN 0.2 10/06/2014 1509   NITRITE NEGATIVE 12/01/2019 2115   LEUKOCYTESUR SMALL (A) 12/01/2019 2115     [name] M.D. [facility] 12/03/2019, 11:32 AM   Call night coverage person covering after 7pm

## 2019-12-04 LAB — CBC
HCT: 34.8 % — ABNORMAL LOW (ref 36.0–46.0)
Hemoglobin: 11.2 g/dL — ABNORMAL LOW (ref 12.0–15.0)
MCH: 30.8 pg (ref 26.0–34.0)
MCHC: 32.2 g/dL (ref 30.0–36.0)
MCV: 95.6 fL (ref 80.0–100.0)
Platelets: 50 10*3/uL — ABNORMAL LOW (ref 150–400)
RBC: 3.64 MIL/uL — ABNORMAL LOW (ref 3.87–5.11)
RDW: 14.8 % (ref 11.5–15.5)
WBC: 7.9 10*3/uL (ref 4.0–10.5)
nRBC: 0 % (ref 0.0–0.2)

## 2019-12-04 LAB — URINE CULTURE: Culture: >=100000 — AB

## 2019-12-04 LAB — BASIC METABOLIC PANEL
Anion gap: 10 (ref 5–15)
BUN: 11 mg/dL (ref 6–20)
CO2: 23 mmol/L (ref 22–32)
Calcium: 8.5 mg/dL — ABNORMAL LOW (ref 8.9–10.3)
Chloride: 104 mmol/L (ref 98–111)
Creatinine, Ser: 0.49 mg/dL (ref 0.44–1.00)
GFR calc Af Amer: >60 mL/min (ref >60)
GFR calc non Af Amer: >60 mL/min (ref >60)
Glucose, Bld: 88 mg/dL (ref 70–99)
Potassium: 3.1 mmol/L — ABNORMAL LOW (ref 3.5–5.1)
Sodium: 137 mmol/L (ref 135–145)

## 2019-12-04 LAB — CULTURE, BLOOD (ROUTINE X 2): Special Requests: ADEQUATE

## 2019-12-04 LAB — PROCALCITONIN: Procalcitonin: 6.74 ng/mL

## 2019-12-04 MED ORDER — STERILE WATER FOR INJECTION IJ SOLN
INTRAMUSCULAR | Status: AC
  Filled 2019-12-04: qty 10

## 2019-12-04 MED ORDER — ONDANSETRON HCL 4 MG/2ML IJ SOLN
4.0000 mg | INTRAMUSCULAR | Status: DC | PRN
  Administered 2019-12-04 – 2019-12-05 (×4): 4 mg via INTRAVENOUS
  Filled 2019-12-04 (×5): qty 2

## 2019-12-04 MED ORDER — POTASSIUM CHLORIDE 20 MEQ PO PACK
40.0000 meq | PACK | Freq: Once | ORAL | Status: AC
  Administered 2019-12-04: 40 meq
  Filled 2019-12-04: qty 2

## 2019-12-04 MED ORDER — ALTEPLASE 2 MG IJ SOLR
2.0000 mg | Freq: Once | INTRAMUSCULAR | Status: AC
  Administered 2019-12-04: 2 mg

## 2019-12-04 NOTE — Progress Notes (Signed)
Dr. Junious Silk paged by RN at the request of Dr. Tana Coast, in order to determine if urology has any other orders or interventions for the pt at this time. Urology has not yet been by to reassess patient since the left uretal stent was placed on 2/28. RN will continue to assess pt needs at this time.

## 2019-12-04 NOTE — Progress Notes (Signed)
Report called to 4W RN. All questions answered by RN at this time. All patient belongings sent with pt upstairs. Pt was transferred in the bed by charge RN and NT, on tele. 74W RN will continue to monitor pt.

## 2019-12-04 NOTE — Plan of Care (Addendum)
Patient has rested most of the night and chosen to stay on her right side as positioned by her husband prior to leaving at 2000. No new signs of infection or distress.  Problem: Education: Goal: Knowledge of General Education information will improve Description: Including pain rating scale, medication(s)/side effects and non-pharmacologic comfort measures Outcome: Progressing   Problem: Health Behavior/Discharge Planning: Goal: Ability to manage health-related needs will improve Outcome: Progressing   Problem: Clinical Measurements: Goal: Ability to maintain clinical measurements within normal limits will improve Outcome: Progressing Goal: Will remain free from infection Outcome: Progressing Goal: Diagnostic test results will improve Outcome: Progressing Goal: Respiratory complications will improve Outcome: Progressing Goal: Cardiovascular complication will be avoided Outcome: Progressing   Problem: Activity: Goal: Risk for activity intolerance will decrease Outcome: Progressing   Problem: Nutrition: Goal: Adequate nutrition will be maintained Outcome: Progressing   Problem: Coping: Goal: Level of anxiety will decrease Outcome: Progressing   Problem: Elimination: Goal: Will not experience complications related to bowel motility Outcome: Progressing Goal: Will not experience complications related to urinary retention Outcome: Progressing   Problem: Pain Managment: Goal: General experience of comfort will improve Outcome: Progressing   Problem: Safety: Goal: Ability to remain free from injury will improve Outcome: Progressing   Problem: Skin Integrity: Goal: Risk for impaired skin integrity will decrease Outcome: Progressing   Problem: Fluid Volume: Goal: Hemodynamic stability will improve Outcome: Progressing   Problem: Clinical Measurements: Goal: Diagnostic test results will improve Outcome: Progressing Goal: Signs and symptoms of infection will  decrease Outcome: Progressing   Problem: Respiratory: Goal: Ability to maintain adequate ventilation will improve Outcome: Progressing

## 2019-12-04 NOTE — Progress Notes (Signed)
         [facility]                                                                              Patient Demographics  [name], is a 44 y.o. female, DOB - 02/16/1976, MRN:[id number]  Admit date - 12/01/2019   Admitting Physician Ching T Tu, DO  Outpatient Primary MD for the patient is Smith, Candace, MD  Outpatient specialists:   LOS - 3  days   Medical records reviewed and are as summarized below:    Chief Complaint  Patient presents with  . 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  Patient was obtunded at the time of admission and hence history was obtained from the patient's husband who reported acute left-sided flank pain and groin pain similar to her previous kidney stones.  Also had an episode of vomiting in the ED, no fevers.  At the time of admission, had difficulty speaking, having more labored and rhonchorous respirations.  Also reported decline in the past 2 weeks and now unable to move much of her left side.  In ED, temp 100.1 F, tachycardiac, hypertensive, was placed on 2 L via nasal cannula, leukocytosis 11 K, potassium 3.4,  UA positive for UTI, creatinine 0.4  Hospital course so far CT abdomen renal stone study showed 2 adjacent stones in the distal left ureter with mild left hydronephrosis status post cystoscopy with left retrograde pyelogram and left ureteral stent placement Blood cultures positive for Klebsiella pneumonia, urine culture positive for Klebsiella pneumonia Transferring patient to telemetry floor today, hopefully DC home in 24 to 48 hours, will need at least 2 weeks of antibiotics until stone removal, will follow urology recommendations  Assessment &  Plan    Principal Problem:   Sepsis (HCC) due to UTI, possible pyelonephritis, infected distal left ureteral stone -CT abdomen renal stone study showed distal left ureteral stone with mild left hydronephrosis.  Patient met sepsis criteria at the time of admission secondary to fevers, tachycardia, lactic acidosis, leukocytosis, UTI with possible infected stone -Urology was consulted, patient emergently underwent a cystoscopy with left retrograde pyelogram and left ureteral stent placement -Procalcitonin 29.1 -> 15.42 -> 6.74 -Lactic acid improving 3.3-> 0.8  - blood cultures positive for Klebsiella pneumonia, urine culture Klebsiella pneumonia -Continue IV Rocephin, will follow urology recommendations regarding duration of antibiotics, likely will need at least 14 days until stone removal -Continues to have pain in left flank persistently  Active Problems:   Moderate persistent asthma without complication -No wheezing, Continue soft diet, SLP evaluation -Has PEG tube -Continue Xopenex, budesonide, prednisone     Astrocytoma of spinal cord (HCC),  Grade II astrocytoma of brain (HCC) -Currently in home hospice, continue prednisone, antiemetics, outpatient follow-up with oncology -PT evaluation once able to tolerate, will resume home hospice once discharged    Hypokalemia -Potassium 3.1, replaced     Acute metabolic encephalopathy Likely due   to #1, alert and oriented  History of depression Continue sertraline  GERD Continue PPI  Code Status: DNR DVT Prophylaxis:   SCD's Family Communication: Discussed all imaging results, lab results, explained to the patient's husband on the phone.   Disposition Plan: Patient from home with hospice.  Anticipated discharge to home with hospice once cleared from urology/antibiotic duration, will resume hospice  Time Spent in minutes 25 minutes  Procedures:   Cystoscopy with left retrograde pyelogram and left ureteral stent placement  (12/02/2019, Dr. Eskridge)  Consultants:   Urology  Antimicrobials:   Anti-infectives (From admission, onward)   Start     Dose/Rate Route Frequency Ordered Stop   12/02/19 1800  cefTRIAXone (ROCEPHIN) 2 g in sodium chloride 0.9 % 100 mL IVPB     2 g 200 mL/hr over 30 Minutes Intravenous Every 24 hours 12/02/19 1718     12/02/19 1000  ciprofloxacin (CIPRO) IVPB 400 mg  Status:  Discontinued     400 mg 200 mL/hr over 60 Minutes Intravenous Every 12 hours 12/02/19 0140 12/02/19 1718   12/01/19 2200  ciprofloxacin (CIPRO) IVPB 400 mg     400 mg 200 mL/hr over 60 Minutes Intravenous  Once 12/01/19 2154 12/02/19 0045         Medications  Scheduled Meds: . budesonide  0.5 mg Nebulization BID  . fentaNYL  1 patch Transdermal Q3 days  . fluticasone  1 spray Each Nare Daily  . loratadine  10 mg Per Tube Daily  . mouth rinse  15 mL Mouth Rinse BID  . OLANZapine  10 mg Per Tube QHS  . ondansetron  4 mg Intravenous Once  . pantoprazole sodium  40 mg Per Tube Daily  . polyethylene glycol  17 g Per Tube Daily  . predniSONE  10 mg Per Tube Q1200  . predniSONE  20 mg Per Tube Q breakfast  . senna-docusate  1 tablet Per Tube BID  . sertraline  50 mg Per Tube Daily  . tamsulosin  0.4 mg Oral QPC supper   Continuous Infusions: . cefTRIAXone (ROCEPHIN)  IV Stopped (12/03/19 1817)   PRN Meds:.HYDROmorphone (DILAUDID) injection, HYDROmorphone, levalbuterol, LORazepam, ondansetron (ZOFRAN) IV, promethazine, sodium chloride flush      Subjective:   Trine Brede was seen and examined today.  Continues to complain of left-sided flank pain.  Has chronic left-sided weakness.  No fevers or chills.  No acute nausea or vomiting.    Objective:   Vitals:   12/04/19 1000 12/04/19 1100 12/04/19 1113 12/04/19 1200  BP: (!) 122/94  (!) 138/93   Pulse: (!) 112 (!) 109 (!) 107 (!) 108  Resp: 17 15 14 18  Temp:      TempSrc:      SpO2: 95% 96% 99% 96%  Weight:      Height:         Intake/Output Summary (Last 24 hours) at 12/04/2019 1220 Last data filed at 12/04/2019 1044 Gross per 24 hour  Intake 710 ml  Output 975 ml  Net -265 ml     Wt Readings from Last 3 Encounters:  12/02/19 72.6 kg  06/05/18 72.6 kg  04/03/18 78.8 kg   Physical Exam  General: Alert and oriented x 3, NAD  Cardiovascular: S1 S2 clear, RRR. No pedal edema b/l  Respiratory: CTAB, no wheezing, rales or rhonchi  Gastrointestinal: Soft, nontender, nondistended, NBS, PEG tube  Ext: no pedal edema bilaterally  Neuro: Left-sided weakness  Musculoskeletal: No cyanosis, clubbing  Skin: No   rashes  Psych: Normal affect and demeanor, alert and oriented x3    Data Reviewed:  I have personally reviewed following labs and imaging studies  Micro Results Recent Results (from the past 240 hour(s))  Urine Culture     Status: Abnormal   Collection Time: 12/01/19  9:15 PM   Specimen: Urine, Random  Result Value Ref Range Status   Specimen Description   Final    URINE, RANDOM Performed at [facility] [address] [place], [place] [address]    Special Requests   Final    NONE Performed at [facility], [place] [address] [place], [place] [address]    Culture >=100,000 COLONIES/mL KLEBSIELLA PNEUMONIAE (A)  Final   Report Status 12/04/2019 FINAL  Final   Organism ID, Bacteria KLEBSIELLA PNEUMONIAE (A)  Final      Susceptibility   Klebsiella pneumoniae - MIC*    AMPICILLIN RESISTANT Resistant     CEFAZOLIN <=4 SENSITIVE Sensitive     CEFTRIAXONE <=0.25 SENSITIVE Sensitive     CIPROFLOXACIN <=0.25 SENSITIVE Sensitive     GENTAMICIN <=1 SENSITIVE Sensitive     IMIPENEM <=0.25 SENSITIVE Sensitive     NITROFURANTOIN 64 INTERMEDIATE Intermediate     TRIMETH/SULFA <=20 SENSITIVE Sensitive     AMPICILLIN/SULBACTAM 4 SENSITIVE Sensitive     PIP/TAZO <=4 SENSITIVE Sensitive     * >=100,000 COLONIES/mL KLEBSIELLA PNEUMONIAE  Blood culture (routine x  2)     Status: Abnormal   Collection Time: 12/01/19  9:49 PM   Specimen: BLOOD LEFT ARM  Result Value Ref Range Status   Specimen Description   Final    BLOOD LEFT ARM Performed at [facility] [address] [place], [place] [address]    Special Requests   Final    BOTTLES DRAWN AEROBIC AND ANAEROBIC Blood Culture adequate volume Performed at [facility] [address] [place], [place] [address]    Culture  Setup Time   Final    GRAM NEGATIVE RODS IN BOTH AEROBIC AND ANAEROBIC BOTTLES CRITICAL RESULT CALLED TO, READ BACK BY AND VERIFIED WITH: PHARMD KIM HURGHT _0  12/02/19 AKT Performed at [facility], [place] [address] [place], [place] [address]    Culture KLEBSIELLA PNEUMONIAE (A)  Final   Report Status 12/04/2019 FINAL  Final   Organism ID, Bacteria KLEBSIELLA PNEUMONIAE  Final      Susceptibility   Klebsiella pneumoniae - MIC*    AMPICILLIN >=32 RESISTANT Resistant     CEFAZOLIN <=4 SENSITIVE Sensitive     CEFEPIME <=0.12 SENSITIVE Sensitive     CEFTAZIDIME <=1 SENSITIVE Sensitive     CEFTRIAXONE <=0.25 SENSITIVE Sensitive     CIPROFLOXACIN <=0.25 SENSITIVE Sensitive     GENTAMICIN <=1 SENSITIVE Sensitive     IMIPENEM <=0.25 SENSITIVE Sensitive     TRIMETH/SULFA <=20 SENSITIVE Sensitive     AMPICILLIN/SULBACTAM 4 SENSITIVE Sensitive     PIP/TAZO <=4 SENSITIVE Sensitive     * KLEBSIELLA PNEUMONIAE  Blood Culture ID Panel (Reflexed)     Status: Abnormal   Collection Time: 12/01/19  9:49 PM  Result Value Ref Range Status   Enterococcus species NOT DETECTED NOT DETECTED Final   Listeria monocytogenes NOT DETECTED NOT DETECTED Final   Staphylococcus species NOT DETECTED NOT DETECTED Final   Staphylococcus aureus (BCID) NOT DETECTED NOT DETECTED Final   Streptococcus species NOT DETECTED NOT DETECTED Final   Streptococcus agalactiae NOT DETECTED NOT DETECTED Final  Streptococcus pneumoniae NOT DETECTED NOT DETECTED Final    Streptococcus pyogenes NOT DETECTED NOT DETECTED Final   Acinetobacter baumannii NOT DETECTED NOT DETECTED Final   Enterobacteriaceae species DETECTED (A) NOT DETECTED Final    Comment: Enterobacteriaceae represent a large family of gram-negative bacteria, not a single organism. CRITICAL RESULT CALLED TO, READ BACK BY AND VERIFIED WITH: PHARMD KIM HURGHT _0  12/02/19 AKT    Enterobacter cloacae complex NOT DETECTED NOT DETECTED Final   Escherichia coli NOT DETECTED NOT DETECTED Final   Klebsiella oxytoca NOT DETECTED NOT DETECTED Final   Klebsiella pneumoniae DETECTED (A) NOT DETECTED Final    Comment: CRITICAL RESULT CALLED TO, READ BACK BY AND VERIFIED WITH: PHARMD KIM HURGHT _1  12/02/2019 AKT    Proteus species NOT DETECTED NOT DETECTED Final   Serratia marcescens NOT DETECTED NOT DETECTED Final   Carbapenem resistance NOT DETECTED NOT DETECTED Final   Haemophilus influenzae NOT DETECTED NOT DETECTED Final   Neisseria meningitidis NOT DETECTED NOT DETECTED Final   Pseudomonas aeruginosa NOT DETECTED NOT DETECTED Final   Candida albicans NOT DETECTED NOT DETECTED Final   Candida glabrata NOT DETECTED NOT DETECTED Final   Candida krusei NOT DETECTED NOT DETECTED Final   Candida parapsilosis NOT DETECTED NOT DETECTED Final   Candida tropicalis NOT DETECTED NOT DETECTED Final    Comment: Performed at [facility], [place] [address], [place], [place] [address]  Respiratory Panel by RT PCR (Flu A&B, Covid) - Nasopharyngeal Swab     Status: None   Collection Time: 12/01/19 10:08 PM   Specimen: Nasopharyngeal Swab  Result Value Ref Range Status   SARS Coronavirus 2 by RT PCR NEGATIVE NEGATIVE Final    Comment: (NOTE) SARS-CoV-2 target nucleic acids are NOT DETECTED. The SARS-CoV-2 RNA is generally detectable in upper respiratoy specimens during the acute phase of infection. The lowest concentration of SARS-CoV-2 viral copies this assay can detect is 131 copies/mL. A negative  result does not preclude SARS-Cov-2 infection and should not be used as the sole basis for treatment or other patient management decisions. A negative result may occur with  improper specimen collection/handling, submission of specimen other than nasopharyngeal swab, presence of viral mutation(s) within the areas targeted by this assay, and inadequate number of viral copies (<131 copies/mL). A negative result must be combined with clinical observations, patient history, and epidemiological information. The expected result is Negative. Fact Sheet for Patients:  PinkCheek.be Fact Sheet for Healthcare Providers:  GravelBags.it This test is not yet ap proved or cleared by the [place] FDA and  has been authorized for detection and/or diagnosis of SARS-CoV-2 by FDA under an Emergency Use Authorization (EUA). This EUA will remain  in effect (meaning this test can be used) for the duration of the COVID-19 declaration under Section 564(b)(1) of the Act, 21 U.S.C. section 360bbb-3(b)(1), unless the authorization is terminated or revoked sooner.    Influenza A by PCR NEGATIVE NEGATIVE Final   Influenza B by PCR NEGATIVE NEGATIVE Final    Comment: (NOTE) The Xpert Xpress SARS-CoV-2/FLU/RSV assay is intended as an aid in  the diagnosis of influenza from Nasopharyngeal swab specimens and  should not be used as a sole basis for treatment. Nasal washings and  aspirates are unacceptable for Xpert Xpress SARS-CoV-2/FLU/RSV  testing. Fact Sheet for Patients: PinkCheek.be Fact Sheet for Healthcare Providers: GravelBags.it This test is not yet approved or cleared by the [place] FDA and  has been authorized for detection and/or diagnosis of SARS-CoV-2 by  FDA under an Emergency Use Authorization (EUA). This EUA will remain  in effect (meaning this test can be used) for the  duration of the  Covid-19 declaration under Section 564(b)(1) of the Act, 21  U.S.C. section 360bbb-3(b)(1), unless the authorization is  terminated or revoked. Performed at [facility], [place] [address] [place], [place] [address]   MRSA PCR Screening     Status: None   Collection Time: 12/02/19  6:33 AM   Specimen: Nasopharyngeal  Result Value Ref Range Status   MRSA by PCR NEGATIVE NEGATIVE Final    Comment:        The GeneXpert MRSA Assay (FDA approved for NASAL specimens only), is one component of a comprehensive MRSA colonization surveillance program. It is not intended to diagnose MRSA infection nor to guide or monitor treatment for MRSA infections. Performed at [facility], [place] [address] [place], [place] [address]   Blood culture (routine x 2)     Status: None (Preliminary result)   Collection Time: 12/02/19  7:21 AM   Specimen: BLOOD  Result Value Ref Range Status   Specimen Description   Final    BLOOD RIGHT ARM Performed at [facility] [address], [place], [place] [address]    Special Requests   Final    BOTTLES DRAWN AEROBIC AND ANAEROBIC Blood Culture adequate volume Performed at [facility] [address], [place], [place] [address]    Culture   Final    NO GROWTH 2 DAYS Performed at [facility] [address], [place], [place] [address]    Report Status PENDING  Incomplete    Radiology Reports CT Head Wo Contrast  Result Date: 12/01/2019 CLINICAL DATA:  Headache, history of brain tumor EXAM: CT HEAD WITHOUT CONTRAST TECHNIQUE: Contiguous axial images were obtained from the base of the skull through the vertex without intravenous contrast. COMPARISON:  06/05/2018 FINDINGS: Brain: No acute infarct or hemorrhage. Ventriculostomy catheter unchanged. Lateral ventricles are stable. Infiltrative mass within the brainstem again noted, compatible with known brainstem  glioma. No significant change in the appearance on this unenhanced exam. No acute extra-axial fluid collections. Vascular: No hyperdense vessel or unexpected calcification. Skull: Postsurgical changes are seen from ventriculostomy catheter and occipital craniectomy. No acute fractures. Sinuses/Orbits: No acute finding. Other: None IMPRESSION: 1. Stable known brainstem glioma. 2. No acute infarct or hemorrhage. 3. Postsurgical changes as above. Electronically Signed   By: Randa Ngo M.D.   On: 12/01/2019 20:41   DG CHEST PORT 1 VIEW  Result Date: 12/02/2019 CLINICAL DATA:  Left flank pain since this morning, brainstem glioma EXAM: PORTABLE CHEST 1 VIEW COMPARISON:  06/05/2018 FINDINGS: Single frontal view of the chest demonstrates stable ventriculostomy catheter overlying right chest. Left-sided chest wall port via internal jugular approach tip overlies the atrial caval junction. Cardiac silhouette is unremarkable. Lung volumes are diminished, with crowding of the central vasculature. No airspace disease, effusion, or pneumothorax. IMPRESSION: 1. Low lung volumes.  No acute airspace disease. Electronically Signed   By: Randa Ngo M.D.   On: 12/02/2019 00:07   DG C-Arm 1-60 Min-No Report  Result Date: 12/02/2019 Fluoroscopy was utilized by the requesting physician.  No radiographic interpretation.   CT Renal Stone Study  Result Date: 12/01/2019 CLINICAL DATA:  44 year old female with flank pain. Kidney stone suspected. EXAM: CT ABDOMEN AND PELVIS WITHOUT CONTRAST TECHNIQUE: Multidetector CT imaging of the abdomen and pelvis was performed following the standard protocol  without IV contrast. COMPARISON:  CT abdomen pelvis dated 03/11/2018. FINDINGS: Evaluation of this exam is limited in the absence of intravenous contrast. Lower chest: There are bibasilar linear atelectasis/scarring. A central venous line, likely a dual-lumen dialysis catheter partially visualized with tip close to the junction of  the right atrium and IVC. A 4.0 x 2.5 cm partially visualized lobulated lesion adjacent to the aortic root, incompletely characterized but may represent a pericardial recess or cyst. No intra-abdominal free air. Trace free fluid in the pelvis. Hepatobiliary: The liver is grossly unremarkable. No intrahepatic biliary ductal dilatation. Cholecystectomy. Pancreas: Unremarkable. No pancreatic ductal dilatation or surrounding inflammatory changes. Spleen: Normal in size without focal abnormality. Adrenals/Urinary Tract: The adrenal glands are unremarkable. Two adjacent stones in the distal left ureter may each measuring approximately 7 mm. There is mild left hydronephrosis. Additional nonobstructing bilateral renal calculi noted. There is no hydronephrosis on the right. Small amount of air in the left renal upper pole collecting system may be related to recent instrumentation. Correlation with urinalysis recommended to exclude UTI. The urinary bladder is partially distended. There is air within the urinary bladder. Stomach/Bowel: There is a percutaneous gastrojejunostomy. There is no bowel obstruction or active inflammation. The appendix is normal. Vascular/Lymphatic: The abdominal aorta and IVC are unremarkable. No portal venous gas. There is no adenopathy. Reproductive: The uterus and ovaries are grossly unremarkable. Other: Partially visualized VP shunt with tip in the right lower quadrant. No fluid collection. Musculoskeletal: Osteopenia. No acute osseous pathology. IMPRESSION: 1. Two adjacent stones in the distal left ureter with mild left hydronephrosis. Small amount of air in the left renal upper pole collecting system as well as within the urinary bladder may be related to recent instrumentation. Correlation with urinalysis recommended to exclude UTI. 2. No bowel obstruction. Normal appendix. Electronically Signed   By: Arash  Radparvar M.D.   On: 12/01/2019 20:51    Lab Data:  CBC: Recent Labs  Lab  12/01/19 1758 12/02/19 0721 12/03/19 0352 12/04/19 0612  WBC 11.0* 13.9* 10.7* 7.9  NEUTROABS 9.9*  --   --   --   HGB 13.7 12.1 10.7* 11.2*  HCT 42.8 39.1 34.3* 34.8*  MCV 94.7 97.5 95.8 95.6  PLT 138* 81* 63* 50*   Basic Metabolic Panel: Recent Labs  Lab 12/01/19 1758 12/02/19 0721 12/03/19 0352 12/04/19 0612  NA 139 140 139 137  K 3.4* 3.0* 3.1* 3.1*  CL 102 105 107 104  CO2 24 21* 23 23  GLUCOSE 112* 132* 105* 88  BUN 19 18 16 11  CREATININE 0.49 0.72 0.51 0.49  CALCIUM 9.2 8.3* 7.8* 8.5*   GFR: Estimated Creatinine Clearance: 92.8 mL/min (by C-G formula based on SCr of 0.49 mg/dL). Liver Function Tests: Recent Labs  Lab 12/01/19 1758  AST 19  ALT 44  ALKPHOS 48  BILITOT 0.5  PROT 7.2  ALBUMIN 4.0   Recent Labs  Lab 12/01/19 1758  LIPASE 25   No results for input(s): AMMONIA in the last 168 hours. Coagulation Profile: No results for input(s): INR, PROTIME in the last 168 hours. Cardiac Enzymes: No results for input(s): CKTOTAL, CKMB, CKMBINDEX, TROPONINI in the last 168 hours. BNP (last 3 results) No results for input(s): PROBNP in the last 8760 hours. HbA1C: No results for input(s): HGBA1C in the last 72 hours. CBG: No results for input(s): GLUCAP in the last 168 hours. Lipid Profile: No results for input(s): CHOL, HDL, LDLCALC, TRIG, CHOLHDL, LDLDIRECT in the last 72 hours. Thyroid Function   Tests: No results for input(s): TSH, T4TOTAL, FREET4, T3FREE, THYROIDAB in the last 72 hours. Anemia Panel: No results for input(s): VITAMINB12, FOLATE, FERRITIN, TIBC, IRON, RETICCTPCT in the last 72 hours. Urine analysis:    Component Value Date/Time   COLORURINE AMBER (A) 12/01/2019 2115   APPEARANCEUR CLOUDY (A) 12/01/2019 2115   LABSPEC 1.020 12/01/2019 2115   PHURINE 6.0 12/01/2019 2115   GLUCOSEU NEGATIVE 12/01/2019 2115   HGBUR MODERATE (A) 12/01/2019 2115   BILIRUBINUR NEGATIVE 12/01/2019 2115   KETONESUR 5 (A) 12/01/2019 2115   PROTEINUR 30  (A) 12/01/2019 2115   UROBILINOGEN 0.2 10/06/2014 1509   NITRITE NEGATIVE 12/01/2019 2115   LEUKOCYTESUR SMALL (A) 12/01/2019 2115     Meg Niemeier M.D. [facility] 12/04/2019, 12:20 PM   Call night coverage person covering after 7pm

## 2019-12-04 NOTE — Progress Notes (Signed)
Dr Gloriann Loan with urology contacted RN in regards to rounding on this pt. He is going to coordinate with urology team to ensure someone is by to update pt today.

## 2019-12-04 NOTE — Progress Notes (Signed)
RN attempted to give report to receiving RN on 4W. No answer at nurses station; so RN called 4E and requested RN's #. No answer at this time; RN will attempt to call again soon.

## 2019-12-04 NOTE — Progress Notes (Signed)
SLP Cancellation Note  Patient Details Name: [name] MRN: [id number] DOB: 09-10-1976   Cancelled treatment:       Reason Eval/Treat Not Completed: Patient declined. Husband reports pt is aware of aspiration risk, and weighs risk/benefit re: when and what to eat/drink for comfort. Has PEG tube for nutrition/hydration. Pt/husband do not want further swallow evaluations, and are aware of effect of tumor on pt's swallow function and safety. Signing off. Please reconsult if needs arise.  Kery Haltiwanger B. Quentin Ore, [facility], [place] Speech Language Pathologist Office: [phone] Pager: [phone]  Shonna Chock 12/04/2019, 1:51 PM

## 2019-12-04 NOTE — Consult Note (Addendum)
I have reviewed the patient's chart from this admission to date as well as over the past 18 months.  She has had substantial decline and progression of the brainstem lesion since I last saw her.  She is now on hospice care since December 24.  She has lost all mobility and has a G-tube for nutrition.  During my interaction with her today she aspirated several times while drinking water.  She is feeling substantially better after being stented for an obstructing ureteral stone.  Her urine cultures are growing Klebsiella that is resistant to ampicillin and nitrofurantoin.  It is sensitive to Bactrim.  She should be treated with Bactrim for the next 14 days.  I spoke to the patient and her husband about the timing of the follow-up procedure.  We have agreed to perform the surgery next week.  Ideally we would perform this while she is still being treated for infection but far enough out that her infection is under great control.  As such, the patient should be discharged home when she is deemed safe and ready from a medical perspective and I will schedule her for surgery at the end of next week.  I spent 30 minutes in total time reviewing the patient's chart and discussing her circumstance with her and her husband.

## 2019-12-04 NOTE — Progress Notes (Signed)
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 Patient was  experiencing increased flank pain, increasing somnolence and fever. EMS was called due to level of consciousness then transported to the ED for evaluation on 12/01/19. [facility] was notified prior to transport to ED. Patient is admitted with urosepsis secondary to nephrolithiasis.   Spoke with bedside RN, patient is resting with no complaints, was started on a soft diet and is tolerating fair with scheduled nausea medication. Plan is to move patient out of ICU onto another floor today. Husband is at bedside and has no questions or concerns at this time. The husband does translate for patient at times due to patients dysarthria (he can understand her better). Patient is alert and oriented, nodes and answers appropriately per husband and bedside RN.   V/S:  BP: 113/60, HR:101, RR: 18, O2: 98% RA, T:98.2 Medications: IVs IVF d/c'd, Rocephin 2g in NS 128ml IVPB q24hrs PRNs: Dilaudid inj 4mg  per tube, [id number] on 12/04/19, Ativan 0.5mg  per tube 2142 on 12/03/19, Zofran 4mg  IV 1051 on 12/04/2019 I&O:  Intake: 1543.1, Ouput: 975 as of 3/1 Labs:  K-3.1, PLT-50, Hgb-11.2, RBC-3.62, Ca-8.5  Procedures: Cystoscopy with left retrograde pyelogram and left ureteral stent placement (12/02/2019, Dr. Junious Silk) - Urology continues to follow  Diagnostics:   Renal CT:  IMPRESSION: 1. Two adjacent stones in the distal left ureter with mild left hydronephrosis. Small amount of air in the left renal upper pole collecting system as well as within the urinary bladder may be related to recent instrumentation. Correlation with urinalysis recommended to exclude UTI. 2. No bowel obstruction. Normal appendix.  Epic: 12/03/2019  Principal Problem:   Sepsis ([place]) due to  UTI, possible pyelonephritis, infected distal left ureteral stone -Procalcitonin 29.1 -> 15.42 today -Lactic acid improving 3.3-> 0.8 today - blood cultures positive for Klebsiella pneumonia, urine culture Klebsiella pneumonia  Active Problems:   Moderate persistent asthma without complication -No wheezing but has rhonchi, ?  Aspiration -Continue soft diet, SLP evaluation --Has PEG tube -Continue Xopenex, budesonide, prednisone   Astrocytoma of spinal cord (HCC),  Grade II astrocytoma of brain (HCC) -Currently in home hospice, continue prednisone, antiemetics, outpatient follow-up with oncology -PT evaluation once able to tolerate  Hypokalemia: Potassium 3.1, replaced  Acute metabolic encephalopathy: Likely due to #1, alert and awake today   D/C planning- Return home with hospice once medically stable - Please contact [facility] for all [facility] patient transfer needs upon discharge Goals of Care: clear - Patient is a DNR  Communication with IDT- [facility] team updated on patient status Communication with PC[facility]- updated husband Gerald Stabs on plan, has no questions or concerns at this time   Please call with any hospice related questions/concerns  Manufacturing engineer Clementeen Hoof, BSN, RN [facility] Liaison

## 2019-12-05 LAB — BASIC METABOLIC PANEL
Anion gap: 11 (ref 5–15)
BUN: 10 mg/dL (ref 6–20)
CO2: 24 mmol/L (ref 22–32)
Calcium: 8.5 mg/dL — ABNORMAL LOW (ref 8.9–10.3)
Chloride: 103 mmol/L (ref 98–111)
Creatinine, Ser: 0.56 mg/dL (ref 0.44–1.00)
GFR calc Af Amer: >60 mL/min (ref >60)
GFR calc non Af Amer: >60 mL/min (ref >60)
Glucose, Bld: 90 mg/dL (ref 70–99)
Potassium: 3.2 mmol/L — ABNORMAL LOW (ref 3.5–5.1)
Sodium: 138 mmol/L (ref 135–145)

## 2019-12-05 LAB — CBC
HCT: 34.2 % — ABNORMAL LOW (ref 36.0–46.0)
Hemoglobin: 10.7 g/dL — ABNORMAL LOW (ref 12.0–15.0)
MCH: 29.7 pg (ref 26.0–34.0)
MCHC: 31.3 g/dL (ref 30.0–36.0)
MCV: 95 fL (ref 80.0–100.0)
Platelets: 73 10*3/uL — ABNORMAL LOW (ref 150–400)
RBC: 3.6 MIL/uL — ABNORMAL LOW (ref 3.87–5.11)
RDW: 14.6 % (ref 11.5–15.5)
WBC: 6.7 10*3/uL (ref 4.0–10.5)
nRBC: 0 % (ref 0.0–0.2)

## 2019-12-05 LAB — PROCALCITONIN: Procalcitonin: 4.49 ng/mL

## 2019-12-05 MED ORDER — SULFAMETHOXAZOLE-TRIMETHOPRIM 800-160 MG PO TABS: 1.0000 | ORAL_TABLET | Freq: Two times a day (BID) | ORAL | 0 refills | Status: AC

## 2019-12-05 MED ORDER — HEPARIN SOD (PORK) LOCK FLUSH 100 UNIT/ML IV SOLN: 500.0000 [IU] | INTRAVENOUS | Status: DC | PRN

## 2019-12-05 MED ORDER — POTASSIUM CHLORIDE 20 MEQ/15ML (10%) PO SOLN
60.0000 meq | Freq: Once | ORAL | Status: AC
  Administered 2019-12-05: 60 meq
  Filled 2019-12-05: qty 45

## 2019-12-05 NOTE — Progress Notes (Signed)
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 Patient was  experiencing increased flank pain, increasing somnolence and fever. EMS was called due to level of consciousness then transported to the ED for evaluation on 12/01/19. [facility] was notified prior to transport to ED. Patient is admitted with urosepsis secondary to nephrolithiasis.  Checked in with bedside RN for report, who stated that the plan was for the patient to be discharged home today with hospice. Patient's husband was at bedside and spoke for the patient. Patient stated she is experiencing some intermittent pain in her left flank due to recurrent kidney stones. Husband requested an increase in pain medications if appropriate at home until surgery.    V/S: 98.2, HR 92, BP 161/78, RR 20 with O2 sats at 94%on RA  I&O: 240 Abnormal lab work: (12/03/19)  RBC 3.60, Hemo 10.7, HCT 34.2, Plate 63, K 3.2, Cal 8.5 SARS Coronavirus 2 neg  Procedures: Cystoscopy with left retrograde pyelogram and left ureteral stent placement   Current scheduled medications: Fentanyl 65mg/hr patch, Rocephin 2g IVPB, Protonix 437m20ml oral suspension per tube, Zofran 75m60mPRN: Dilaudid 75mg73m Q4, Ativan 0.5mg 59m tube Q6hr  Epic D/C note: Discharge disposition: Home with hospice  Recommendations for Outpatient Follow-Up:   Follow up with urology as scheduled by the clinic in 1 week.  Keep  Foley catheter until then  Further care as per home hospice.  Patient is allergic to NSAIDs.  Please adjust her Dilaudid dose for pain at home as per hospice care provider.  Discharge Diagnosis:  Principal Problem:   Sepsis (HCC) [place] Problems:   Moderate persistent asthma without complication   Astrocytoma of spinal cord (HCC)   Grade II  astrocytoma of brain (HCC)   Hypokalemia   Nephrolithiasis   Acute metabolic encephalopathy  Patient is a 43 ye41 old female with history of infiltrative astrocytoma extending to cervical cord status post chemotherapy, and home hospice since 09/2019, moderate persistent asthma, dysphagia, history of nephrolithiasis presented to the hospital with  acute worsening of the left flank and groin pain. Patient was obtunded at the time of admission and hence history was obtained from the patient's husband who reported acute left-sided flank pain and groin pain similar to her previous kidney stones.  Patient also had an episode of vomiting in the ED, no fevers. At the time of admission, patient had difficulty speaking, having more labored and rhonchorous respirations. Also reported decline in the past 2 weeks and now unable to move much of her left side.  In the ED, In ED, temp 100.1 F, tachycardiac, hypertensive, was placed on 2 L via nasal cannula, leukocytosis 11 K, potassium 3.4, UA positive for UTI, creatinine 0.4.   CT abdomen renal stone showed 2 adjacent stones in the distal left ureter with mild left hydronephrosis. Patient underwent post cystoscopy with left retrograde pyelogram and left ureteral stent placement Blood cultures positive for Klebsiella pneumonia, urine culture positive for Klebsiella pneumonia  Hospital Course:  Following conditions were addressed during hospitalization as listed below,  Klebsiella pneumoniae sepsis (HCC) [place] to UTI, possible pyelonephritis, infected distal left ureteral stone. CT abdomen renal stone study showed distal left ureteral stone with mild left hydronephrosis. Patient met sepsis criteria at the time of admission secondary to fevers, tachycardia, lactic acidosis, leukocytosis, UTI with possible infected  stone.  Patient was seen by urology and underwent cystoscopy with left retrograde pyelogram and left ureteral stent placement.  Procalcitonin trended  down including lactic acid.  Patient was afebrile without leukocytosis prior to discharge.  Patient received IV Rocephin during hospitalization.  Urology recommended continuation of Bactrim twice a day for the next 14 days since Klebsiella was resistant to ampicillin and nitrofurantoin.  Patient did have positive blood culture and urine culture on 2/27.  Repeat blood culture from 2/28 have been negative in 3 days.  Flank pain secondary to ureteric stone status post stent.  Patient will undergo definitive surgery next week.  Patient is allergic to NSAIDs.  NSAIDs should have been a ideal choice.  Patient is already on Dilaudid at home.  Have the spoken with the hospice liaison about it.  Patient would benefit from increasing the frequency of Dilaudid at home in the interim, until she gets her surgery.  Moderate persistent asthma without complication Will be continued on Xopenex, budesonide.  No acute flare at this time.  Astrocytoma of spinal cord ([place]), Grade II astrocytoma of brain ([place]) -Currently in home hospice, continue prednisone, antiemetics, home hospice once discharged  Hypokalemia Will replenish p.o. potassium .  Acute metabolic encephalopathy Resolved.  History of depression Continue sertraline  GERD Continue PPI  Disposition.  At this time, patient is stable for disposition to hospice home.  Patient will have to follow-up up with urology as outpatient to discuss about surgical intervention in the future.  Follow-up plan as per home   CT renal: IMPRESSION: 1. Two adjacent stones in the distal left ureter with mild left hydronephrosis. Small amount of air in the left renal upper pole collecting system as well as within the urinary bladder may be related to recent instrumentation. Correlation with urinalysis recommended to exclude UTI. 2. No bowel obstruction. Normal appendix.  D/C planning- Return home with hospice 12/05/2019  Please contact [facility] for all  [facility] patient transfer needs upon discharge Goals of Care: Patient is a DNR  Communication with IDT- [place] team updated on patient status Communication with PCG- updated husband Gerald Stabs on plan, has no questions or concerns at this time  Please call with any hospice related questions/concerns  Manufacturing engineer Clementeen Hoof, BSN, RN [facility] Liaison

## 2019-12-05 NOTE — TOC Progression Note (Signed)
Transition of Care [facility]) - Progression Note    Patient Details  Name: [name] MRN: [id number] Date of Birth: 07/13/1976  Transition of [facility] CM/SW Contact  Purcell Mouton, RN Phone Number: 12/05/2019, 1:28 PM  Clinical Narrative:    EMS non-emergency called to transport pt home.         Expected Discharge Plan and Services           Expected Discharge Date: 12/05/19                                     Social Determinants of Health (SDOH) Interventions    Readmission Risk Interventions No flowsheet data found.

## 2019-12-05 NOTE — Discharge Summary (Addendum)
Physician Discharge Summary  [name] DZH:[id number] DOB: 10/18/1975 DOA: 12/01/2019  PCP: Carol Ada, MD  Admit date: 12/01/2019 Discharge date: 12/05/2019  Admitted From: Home  Discharge disposition: Home with hospice  Recommendations for Outpatient Follow-Up:    Follow up with urology as scheduled by the clinic in 1 week.  Keep  Foley catheter until then  Further care as per home hospice.  Patient is allergic to NSAIDs.  Please adjust her Dilaudid dose for pain at home as per hospice care provider.  Discharge Diagnosis:   Principal Problem:   Sepsis ([place]) Active Problems:   Moderate persistent asthma without complication   Astrocytoma of spinal cord (HCC)   Grade II astrocytoma of brain (HCC)   Hypokalemia   Nephrolithiasis   Acute metabolic encephalopathy   Discharge Condition: Improved.  Diet recommendation: soft diet  Wound care: None.  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  Patient was obtunded at the time of admission and hence history was obtained from the patient's husband who reported acute left-sided flank pain and groin pain similar to her previous kidney stones.  Patient also had an episode of vomiting in the ED, no fevers.  At the time of admission, patient had difficulty speaking, having more labored and rhonchorous respirations.  Also reported decline in the past 2 weeks and now unable to move much of her left side.  In the ED, In ED, temp 100.1 F, tachycardiac, hypertensive, was placed on 2 L via nasal cannula, leukocytosis 11 K, potassium 3.4,  UA positive for UTI, creatinine 0.4.   CT abdomen renal stone showed 2 adjacent  stones in the distal left ureter with mild left hydronephrosis. Patient underwent post cystoscopy with left retrograde pyelogram and left ureteral stent placement Blood cultures positive for Klebsiella pneumonia, urine culture positive for Klebsiella pneumonia  Hospital Course:   Following conditions were addressed during hospitalization as listed below,  Klebsiella pneumoniae sepsis ([place]) due to UTI, possible pyelonephritis, infected distal left ureteral stone. CT abdomen renal stone study showed distal left ureteral stone with mild left hydronephrosis.  Patient met sepsis criteria at the time of admission secondary to fevers, tachycardia, lactic acidosis, leukocytosis, UTI with possible infected stone.  Patient was seen by urology and underwent cystoscopy with left retrograde pyelogram and left ureteral stent placement.  Procalcitonin trended down including lactic acid.  Patient was afebrile without leukocytosis prior to discharge.  Patient received IV Rocephin during hospitalization.  Urology recommended continuation of Bactrim twice a day for the next 14 days since Klebsiella was resistant to ampicillin and nitrofurantoin.  Patient did have positive blood culture and urine culture on 2/27.  Repeat blood culture from 2/28 have been negative in 3 days.  Flank pain secondary to ureteric stone status post stent.  Patient will undergo definitive surgery next week.  Patient is allergic to NSAIDs.  NSAIDs should have been a ideal choice.  Patient is already on Dilaudid at home.  Have the spoken with the hospice liaison about it.  Patient would benefit from increasing the frequency of Dilaudid at home in the interim, until she gets her surgery.    Moderate persistent asthma without complication Will be continued on Xopenex, budesonide.  No acute flare at this time.    Astrocytoma of spinal cord ([place]),  Grade II astrocytoma of brain ([place]) -Currently in home hospice, continue prednisone, antiemetics, home  hospice once discharged    Hypokalemia Will replenish p.o. potassium .    Acute metabolic encephalopathy Resolved.  History of depression Continue sertraline  GERD Continue PPI  Disposition.  At this time, patient is stable for disposition to hospice home.  Patient will have to follow-up up with urology as outpatient to discuss about surgical intervention in the future.  Follow-up plan as per home hospice.  Medical Consultants:    urology  Procedures:    Cystoscopy with left retrograde pyelogram and left ureteral stent placement (12/02/2019, Dr. Junious Silk)  Subjective:   Today, patient wishes to go home, complains of mild left-sided pain and weakness.  No nausea vomiting fever chills.  Discharge Exam:   Vitals:   12/04/19 2123 12/05/19 0439  BP: 130/73 (!) 161/78  Pulse: 96 94  Resp: 18 20  Temp: 98.8 F (37.1 C) 98.2 F (36.8 C)  SpO2: 92% 94%   Vitals:   12/04/19 1310 12/04/19 1934 12/04/19 2123 12/05/19 0439  BP: (!) 142/87  130/73 (!) 161/78  Pulse: 92  96 94  Resp: '18  18 20  ' Temp: 98.8 F (37.1 C)  98.8 F (37.1 C) 98.2 F (36.8 C)  TempSrc: Oral  Oral Oral  SpO2: 95% 94% 92% 94%  Weight: 99.6 kg     Height:       General: Alert awake, not in obvious distress, thinly built, slurred speech HENT: pupils equally reacting to light,  No scleral pallor or icterus noted. Oral mucosa is moist.  Chest:  Clear breath sounds.  Diminished breath sounds bilaterally. No crackles or wheezes.  CVS: S1 &S2 heard. No murmur.  Regular rate and rhythm. Abdomen: Soft, nontender, nondistended.  Bowel sounds are heard.  PEG tube in place.  On a Foley catheter. Extremities: No cyanosis, clubbing or edema.  Peripheral pulses are palpable. Psych: Alert, awake and oriented, CNS: Slurred speech.  Left-sided weakness. Skin: Warm and dry.  No rashes noted.  The results of significant diagnostics from this hospitalization (including imaging, microbiology, ancillary and  laboratory) are listed below for reference.     Diagnostic Studies:   CT Head Wo Contrast  Result Date: 12/01/2019 CLINICAL DATA:  Headache, history of brain tumor EXAM: CT HEAD WITHOUT CONTRAST TECHNIQUE: Contiguous axial images were obtained from the base of the skull through the vertex without intravenous contrast. COMPARISON:  06/05/2018 FINDINGS: Brain: No acute infarct or hemorrhage. Ventriculostomy catheter unchanged. Lateral ventricles are stable. Infiltrative mass within the brainstem again noted, compatible with known brainstem glioma. No significant change in the appearance on this unenhanced exam. No acute extra-axial fluid collections. Vascular: No hyperdense vessel or unexpected calcification. Skull: Postsurgical changes are seen from ventriculostomy catheter and occipital craniectomy. No acute fractures. Sinuses/Orbits: No acute finding. Other: None IMPRESSION: 1. Stable known brainstem glioma. 2. No acute infarct or hemorrhage. 3. Postsurgical changes as above. Electronically Signed   By: Randa Ngo M.D.   On: 12/01/2019 20:41   DG CHEST PORT 1 VIEW  Result Date: 12/02/2019 CLINICAL DATA:  Left flank pain since this morning, brainstem glioma EXAM: PORTABLE CHEST 1 VIEW COMPARISON:  06/05/2018 FINDINGS: Single frontal view of the chest demonstrates stable ventriculostomy catheter overlying right chest. Left-sided chest wall port via internal jugular approach tip overlies the atrial caval junction. Cardiac silhouette is unremarkable. Lung volumes are  diminished, with crowding of the central vasculature. No airspace disease, effusion, or pneumothorax. IMPRESSION: 1. Low lung volumes.  No acute airspace disease. Electronically Signed   By: Randa Ngo M.D.   On: 12/02/2019 00:07   DG C-Arm 1-60 Min-No Report  Result Date: 12/02/2019 Fluoroscopy was utilized by the requesting physician.  No radiographic interpretation.   CT Renal Stone Study  Result Date: 12/01/2019 CLINICAL  DATA:  44 year old female with flank pain. Kidney stone suspected. EXAM: CT ABDOMEN AND PELVIS WITHOUT CONTRAST TECHNIQUE: Multidetector CT imaging of the abdomen and pelvis was performed following the standard protocol without IV contrast. COMPARISON:  CT abdomen pelvis dated 03/11/2018. FINDINGS: Evaluation of this exam is limited in the absence of intravenous contrast. Lower chest: There are bibasilar linear atelectasis/scarring. A central venous line, likely a dual-lumen dialysis catheter partially visualized with tip close to the junction of the right atrium and IVC. A 4.0 x 2.5 cm partially visualized lobulated lesion adjacent to the aortic root, incompletely characterized but may represent a pericardial recess or cyst. No intra-abdominal free air. Trace free fluid in the pelvis. Hepatobiliary: The liver is grossly unremarkable. No intrahepatic biliary ductal dilatation. Cholecystectomy. Pancreas: Unremarkable. No pancreatic ductal dilatation or surrounding inflammatory changes. Spleen: Normal in size without focal abnormality. Adrenals/Urinary Tract: The adrenal glands are unremarkable. Two adjacent stones in the distal left ureter may each measuring approximately 7 mm. There is mild left hydronephrosis. Additional nonobstructing bilateral renal calculi noted. There is no hydronephrosis on the right. Small amount of air in the left renal upper pole collecting system may be related to recent instrumentation. Correlation with urinalysis recommended to exclude UTI. The urinary bladder is partially distended. There is air within the urinary bladder. Stomach/Bowel: There is a percutaneous gastrojejunostomy. There is no bowel obstruction or active inflammation. The appendix is normal. Vascular/Lymphatic: The abdominal aorta and IVC are unremarkable. No portal venous gas. There is no adenopathy. Reproductive: The uterus and ovaries are grossly unremarkable. Other: Partially visualized VP shunt with tip in the right  lower quadrant. No fluid collection. Musculoskeletal: Osteopenia. No acute osseous pathology. IMPRESSION: 1. Two adjacent stones in the distal left ureter with mild left hydronephrosis. Small amount of air in the left renal upper pole collecting system as well as within the urinary bladder may be related to recent instrumentation. Correlation with urinalysis recommended to exclude UTI. 2. No bowel obstruction. Normal appendix. Electronically Signed   By: Anner Crete M.D.   On: 12/01/2019 20:51     Labs:   Basic Metabolic Panel: Recent Labs  Lab 12/01/19 1758 12/01/19 1758 12/02/19 0721 12/02/19 0721 12/03/19 0352 12/03/19 0352 12/04/19 0612 12/05/19 0530  NA 139  --  140  --  139  --  137 138  K 3.4*   < > 3.0*   < > 3.1*   < > 3.1* 3.2*  CL 102  --  105  --  107  --  104 103  CO2 24  --  21*  --  23  --  23 24  GLUCOSE 112*  --  132*  --  105*  --  88 90  BUN 19  --  18  --  16  --  11 10  CREATININE 0.49  --  0.72  --  0.51  --  0.49 0.56  CALCIUM 9.2  --  8.3*  --  7.8*  --  8.5* 8.5*   < > = values in this interval not displayed.  GFR Estimated Creatinine Clearance: 111.6 mL/min (by C-G formula based on SCr of 0.56 mg/dL). Liver Function Tests: Recent Labs  Lab 12/01/19 1758  AST 19  ALT 44  ALKPHOS 48  BILITOT 0.5  PROT 7.2  ALBUMIN 4.0   Recent Labs  Lab 12/01/19 1758  LIPASE 25   No results for input(s): AMMONIA in the last 168 hours. Coagulation profile No results for input(s): INR, PROTIME in the last 168 hours.  CBC: Recent Labs  Lab 12/01/19 1758 12/02/19 0721 12/03/19 0352 12/04/19 0612 12/05/19 0530  WBC 11.0* 13.9* 10.7* 7.9 6.7  NEUTROABS 9.9*  --   --   --   --   HGB 13.7 12.1 10.7* 11.2* 10.7*  HCT 42.8 39.1 34.3* 34.8* 34.2*  MCV 94.7 97.5 95.8 95.6 95.0  PLT 138* 81* 63* 50* 73*   Cardiac Enzymes: No results for input(s): CKTOTAL, CKMB, CKMBINDEX, TROPONINI in the last 168 hours. BNP: Invalid input(s): POCBNP CBG: No  results for input(s): GLUCAP in the last 168 hours. D-Dimer No results for input(s): DDIMER in the last 72 hours. Hgb A1c No results for input(s): HGBA1C in the last 72 hours. Lipid Profile No results for input(s): CHOL, HDL, LDLCALC, TRIG, CHOLHDL, LDLDIRECT in the last 72 hours. Thyroid function studies No results for input(s): TSH, T4TOTAL, T3FREE, THYROIDAB in the last 72 hours.  Invalid input(s): FREET3 Anemia work up No results for input(s): VITAMINB12, FOLATE, FERRITIN, TIBC, IRON, RETICCTPCT in the last 72 hours. Microbiology Recent Results (from the past 240 hour(s))  Urine Culture     Status: Abnormal   Collection Time: 12/01/19  9:15 PM   Specimen: Urine, Random  Result Value Ref Range Status   Specimen Description   Final    URINE, RANDOM Performed at [facility] [address], [place], [place] [address]    Special Requests   Final    NONE Performed at [facility], [place] [address] [place], [place] [address]    Culture >=100,000 COLONIES/mL KLEBSIELLA PNEUMONIAE (A)  Final   Report Status 12/04/2019 FINAL  Final   Organism ID, Bacteria KLEBSIELLA PNEUMONIAE (A)  Final      Susceptibility   Klebsiella pneumoniae - MIC*    AMPICILLIN RESISTANT Resistant     CEFAZOLIN <=4 SENSITIVE Sensitive     CEFTRIAXONE <=0.25 SENSITIVE Sensitive     CIPROFLOXACIN <=0.25 SENSITIVE Sensitive     GENTAMICIN <=1 SENSITIVE Sensitive     IMIPENEM <=0.25 SENSITIVE Sensitive     NITROFURANTOIN 64 INTERMEDIATE Intermediate     TRIMETH/SULFA <=20 SENSITIVE Sensitive     AMPICILLIN/SULBACTAM 4 SENSITIVE Sensitive     PIP/TAZO <=4 SENSITIVE Sensitive     * >=100,000 COLONIES/mL KLEBSIELLA PNEUMONIAE  Blood culture (routine x 2)     Status: Abnormal   Collection Time: 12/01/19  9:49 PM   Specimen: BLOOD LEFT ARM  Result Value Ref Range Status   Specimen Description   Final    BLOOD LEFT ARM Performed at [facility]  [address], [place], [place] [address]    Special Requests   Final    BOTTLES DRAWN AEROBIC AND ANAEROBIC Blood Culture adequate volume Performed at [facility] [address], [place], [place] [address]    Culture  Setup Time   Final    GRAM NEGATIVE RODS IN BOTH AEROBIC AND ANAEROBIC BOTTLES CRITICAL RESULT CALLED TO, READ BACK BY AND VERIFIED WITH: PHARMD KIM HURGHT '@[id number]'  12/02/19 AKT Performed  at [facility], [place] [address], [place], [place] [address]    Culture KLEBSIELLA PNEUMONIAE (A)  Final   Report Status 12/04/2019 FINAL  Final   Organism ID, Bacteria KLEBSIELLA PNEUMONIAE  Final      Susceptibility   Klebsiella pneumoniae - MIC*    AMPICILLIN >=32 RESISTANT Resistant     CEFAZOLIN <=4 SENSITIVE Sensitive     CEFEPIME <=0.12 SENSITIVE Sensitive     CEFTAZIDIME <=1 SENSITIVE Sensitive     CEFTRIAXONE <=0.25 SENSITIVE Sensitive     CIPROFLOXACIN <=0.25 SENSITIVE Sensitive     GENTAMICIN <=1 SENSITIVE Sensitive     IMIPENEM <=0.25 SENSITIVE Sensitive     TRIMETH/SULFA <=20 SENSITIVE Sensitive     AMPICILLIN/SULBACTAM 4 SENSITIVE Sensitive     PIP/TAZO <=4 SENSITIVE Sensitive     * KLEBSIELLA PNEUMONIAE  Blood Culture ID Panel (Reflexed)     Status: Abnormal   Collection Time: 12/01/19  9:49 PM  Result Value Ref Range Status   Enterococcus species NOT DETECTED NOT DETECTED Final   Listeria monocytogenes NOT DETECTED NOT DETECTED Final   Staphylococcus species NOT DETECTED NOT DETECTED Final   Staphylococcus aureus (BCID) NOT DETECTED NOT DETECTED Final   Streptococcus species NOT DETECTED NOT DETECTED Final   Streptococcus agalactiae NOT DETECTED NOT DETECTED Final   Streptococcus pneumoniae NOT DETECTED NOT DETECTED Final   Streptococcus pyogenes NOT DETECTED NOT DETECTED Final   Acinetobacter baumannii NOT DETECTED NOT DETECTED Final   Enterobacteriaceae species DETECTED (A) NOT DETECTED Final    Comment: Enterobacteriaceae represent a large  family of gram-negative bacteria, not a single organism. CRITICAL RESULT CALLED TO, READ BACK BY AND VERIFIED WITH: PHARMD KIM HURGHT '@1709'  12/02/19 AKT    Enterobacter cloacae complex NOT DETECTED NOT DETECTED Final   Escherichia coli NOT DETECTED NOT DETECTED Final   Klebsiella oxytoca NOT DETECTED NOT DETECTED Final   Klebsiella pneumoniae DETECTED (A) NOT DETECTED Final    Comment: CRITICAL RESULT CALLED TO, READ BACK BY AND VERIFIED WITH: PHARMD KIM HURGHT '@1709'  12/02/2019 AKT    Proteus species NOT DETECTED NOT DETECTED Final   Serratia marcescens NOT DETECTED NOT DETECTED Final   Carbapenem resistance NOT DETECTED NOT DETECTED Final   Haemophilus influenzae NOT DETECTED NOT DETECTED Final   Neisseria meningitidis NOT DETECTED NOT DETECTED Final   Pseudomonas aeruginosa NOT DETECTED NOT DETECTED Final   Candida albicans NOT DETECTED NOT DETECTED Final   Candida glabrata NOT DETECTED NOT DETECTED Final   Candida krusei NOT DETECTED NOT DETECTED Final   Candida parapsilosis NOT DETECTED NOT DETECTED Final   Candida tropicalis NOT DETECTED NOT DETECTED Final    Comment: Performed at [facility], [place] [address], [place], [place] [address]  Respiratory Panel by RT PCR (Flu A&B, Covid) - Nasopharyngeal Swab     Status: None   Collection Time: 12/01/19 10:08 PM   Specimen: Nasopharyngeal Swab  Result Value Ref Range Status   SARS Coronavirus 2 by RT PCR NEGATIVE NEGATIVE Final    Comment: (NOTE) SARS-CoV-2 target nucleic acids are NOT DETECTED. The SARS-CoV-2 RNA is generally detectable in upper respiratoy specimens during the acute phase of infection. The lowest concentration of SARS-CoV-2 viral copies this assay can detect is 131 copies/mL. A negative result does not preclude SARS-Cov-2 infection and should not be used as the sole basis for treatment or other patient management decisions. A negative result may occur with  improper specimen collection/handling, submission of  specimen other than nasopharyngeal swab,  presence of viral mutation(s) within the areas targeted by this assay, and inadequate number of viral copies (<131 copies/mL). A negative result must be combined with clinical observations, patient history, and epidemiological information. The expected result is Negative. Fact Sheet for Patients:  PinkCheek.be Fact Sheet for Healthcare Providers:  GravelBags.it This test is not yet ap proved or cleared by the [place] FDA and  has been authorized for detection and/or diagnosis of SARS-CoV-2 by FDA under an Emergency Use Authorization (EUA). This EUA will remain  in effect (meaning this test can be used) for the duration of the COVID-19 declaration under Section 564(b)(1) of the Act, 21 U.S.C. section 360bbb-3(b)(1), unless the authorization is terminated or revoked sooner.    Influenza A by PCR NEGATIVE NEGATIVE Final   Influenza B by PCR NEGATIVE NEGATIVE Final    Comment: (NOTE) The Xpert Xpress SARS-CoV-2/FLU/RSV assay is intended as an aid in  the diagnosis of influenza from Nasopharyngeal swab specimens and  should not be used as a sole basis for treatment. Nasal washings and  aspirates are unacceptable for Xpert Xpress SARS-CoV-2/FLU/RSV  testing. Fact Sheet for Patients: PinkCheek.be Fact Sheet for Healthcare Providers: GravelBags.it This test is not yet approved or cleared by the [place] FDA and  has been authorized for detection and/or diagnosis of SARS-CoV-2 by  FDA under an Emergency Use Authorization (EUA). This EUA will remain  in effect (meaning this test can be used) for the duration of the  Covid-19 declaration under Section 564(b)(1) of the Act, 21  U.S.C. section 360bbb-3(b)(1), unless the authorization is  terminated or revoked. Performed at [facility], [place] [address] [place], [place] [address]   MRSA PCR Screening     Status: None   Collection Time: 12/02/19  6:33 AM   Specimen: Nasopharyngeal  Result Value Ref Range Status   MRSA by PCR NEGATIVE NEGATIVE Final    Comment:        The GeneXpert MRSA Assay (FDA approved for NASAL specimens only), is one component of a comprehensive MRSA colonization surveillance program. It is not intended to diagnose MRSA infection nor to guide or monitor treatment for MRSA infections. Performed at [facility], [place] [address], [place], [place] [address]   Blood culture (routine x 2)     Status: None (Preliminary result)   Collection Time: 12/02/19  7:21 AM   Specimen: BLOOD  Result Value Ref Range Status   Specimen Description   Final    BLOOD RIGHT ARM Performed at [place] [address] [place], [place] [address]    Special Requests   Final    BOTTLES DRAWN AEROBIC AND ANAEROBIC Blood Culture adequate volume Performed at [place] [address], [place], [place] [address]    Culture   Final    NO GROWTH 2 DAYS Performed at [facility] [address], [place], [place] [address]    Report Status PENDING  Incomplete     Discharge Instructions:   Discharge Instructions    Diet - low sodium heart healthy   Complete by: As directed    Soft diet   Discharge instructions   Complete by: As directed    Please complete the course of antibiotic.  Urology to follow-up with you regarding surgery, if you do not hear call the office in 3-4 days..  Resume home medication.  Resume home hospice care.   Increase activity slowly   Complete by:  As directed      Allergies as of 12/05/2019      Reactions   Penicillins Anaphylaxis   Has patient had a PCN reaction causing immediate rash, facial/tongue/throat swelling, SOB or lightheadedness with hypotension:  yes Has patient had a PCN reaction causing severe rash involving mucus membranes  or skin necrosis:  no Has patient had a PCN reaction that required hospitalization: yes Has patient had a PCN reaction occurring within the last 10 years: no If all of the above answers are "NO", then may proceed with Cephalosporin use.   Albuterol Other (See Comments)   Jittery    Codeine Hives   Compazine [prochlorperazine Edisylate]    Panic attacks   Diclofenac Hives   Midazolam Itching   Zolpidem Other (See Comments)   Vivid nightmares/ sleep walking   Betasept Surgical Scrub [chlorhexidine Gluconate] Rash      Medication List    TAKE these medications   amphetamine-dextroamphetamine 20 MG 24 hr capsule Commonly known as: ADDERALL XR Take 20 mg by mouth daily.   Asmanex (30 Metered Doses) 220 MCG/INH inhaler Generic drug: mometasone Inhale 2 puffs into the lungs daily. What changed:   when to take this  reasons to take this   bisacodyl 10 MG suppository Commonly known as: DULCOLAX Place 10-20 mg rectally as needed for moderate constipation.   budesonide 180 MCG/ACT inhaler Commonly known as: Pulmicort Flexhaler TAKE 2 PUFFS BY MOUTH TWICE A DAY What changed:   how much to take  how to take this  when to take this  additional instructions   budesonide 32 MCG/ACT nasal spray Commonly known as: Rhinocort Aqua Place 1 spray into both nostrils daily. What changed:   how much to take  when to take this   cetirizine 10 MG tablet Commonly known as: ZYRTEC TAKE 1 TABLET BY MOUTH ONCE DAILY FOR RUNNY NOSE/ITCHING What changed:   how much to take  how to take this  when to take this  additional instructions   Eszopiclone 3 MG Tabs Take 3 mg by mouth daily at 10 pm. Reported on 01/23/2016   fentaNYL 75 MCG/HR Commonly known as: [place] 1 patch onto the skin every 3 (three) days.   HYDROmorphone 4 MG tablet Commonly known as: Dilaudid Take 1-3 tablets every 6 hours as needed for pain What changed:   how much to take  how to take  this  when to take this  reasons to take this  additional instructions   ibuprofen 200 MG tablet Commonly known as: ADVIL Take 400 mg by mouth every 6 (six) hours as needed (pain).   lansoprazole 15 MG capsule Commonly known as: PREVACID Take 15 mg by mouth 2 (two) times daily before a meal.   levalbuterol 45 MCG/ACT inhaler Commonly known as: Xopenex HFA Inhale 2 puffs into the lungs every 4 (four) hours as needed for wheezing or shortness of breath.   LIDOCAINE-PRILOCAINE EX Apply 1 application topically as needed (apply to port site before chemo).   loperamide 2 MG capsule Commonly known as: IMODIUM Take 4 mg by mouth as needed for diarrhea or loose stools.   LORazepam 0.5 MG tablet Commonly known as: ATIVAN Take 0.5 mg by mouth every 6 (six) hours as needed for anxiety.   OLANZapine 10 MG tablet Commonly known as: ZYPREXA Take 10 mg by mouth at bedtime.   ondansetron 8 MG tablet Commonly known as: ZOFRAN Take 8 mg by mouth every 8 (eight) hours as needed  for nausea/vomiting.   OXYGEN Inhale 2 L into the lungs daily as needed (shortness of breath).   polyethylene glycol 17 g packet Commonly known as: MIRALAX / GLYCOLAX Take 17 g by mouth 2 (two) times daily as needed.   predniSONE 20 MG tablet Commonly known as: DELTASONE Take 10-20 mg by mouth 2 (two) times daily with a meal.   promethazine 25 MG tablet Commonly known as: PHENERGAN Take 25 mg by mouth every 4 (four) hours as needed for nausea or vomiting.   senna-docusate 8.6-50 MG tablet Commonly known as: Senokot-S Take 1 tablet by mouth 2 (two) times daily as needed for mild constipation.   sertraline 25 MG tablet Commonly known as: ZOLOFT Take 50 mg by mouth daily.   sulfamethoxazole-trimethoprim 800-160 MG tablet Commonly known as: BACTRIM DS Take 1 tablet by mouth 2 (two) times daily for 14 days.   tamsulosin 0.4 MG Caps capsule Commonly known as: FLOMAX Take 1 capsule (0.4 mg total) by  mouth daily after supper.      Follow-up Information    [name], MD. Call in 3 day(s).   Specialty: Urology Why: if you do not hear from the office Contact information: [address] [place] [address] [phone]            Time coordinating discharge: 39 minutes  Signed:  Jaben Benegas  [facility] 12/05/2019, 10:46 AM

## 2019-12-05 NOTE — TOC Progression Note (Signed)
Transition of Care [facility]) - Progression Note    Patient Details  Name: [name] MRN: [id number] Date of Birth: 1976-05-27  Transition of Care [facility]) CM/SW Contact  Purcell Mouton, RN Phone Number: 12/05/2019, 11:12 AM  Clinical Narrative:     Waiting for IV team to deactivate IV before calling EMS for transportation. Pt will discharge home.        Expected Discharge Plan and Services           Expected Discharge Date: 12/05/19                                     Social Determinants of Health (SDOH) Interventions    Readmission Risk Interventions No flowsheet data found.

## 2019-12-05 NOTE — Progress Notes (Signed)
VAST consulted to de-access port for discharge. VAST RN called unit and spoke with pt's nurse; advised that VAST RN will not be able to come immediately to de-access patient and nurse might try calling 6th floor to see if one of those nurses could come assist. Nurse was appreciative of information.

## 2019-12-05 NOTE — Progress Notes (Signed)
Iv team not available at this timet to deaccess port a cath, suggested calling [place] oncology RN. Senaida Ores RN [place] charge nurse to floor and port deaccessed. Bethann Punches RN

## 2019-12-06 ENCOUNTER — Other Ambulatory Visit: Payer: Self-pay | Admitting: Urology

## 2019-12-07 ENCOUNTER — Encounter (HOSPITAL_COMMUNITY): Payer: Self-pay | Admitting: Urology

## 2019-12-07 LAB — CULTURE, BLOOD (ROUTINE X 2)
Culture: NO GROWTH
Special Requests: ADEQUATE

## 2019-12-10 ENCOUNTER — Other Ambulatory Visit: Payer: Self-pay | Admitting: Allergy & Immunology

## 2019-12-10 ENCOUNTER — Other Ambulatory Visit (HOSPITAL_COMMUNITY): Payer: BC Managed Care – PPO

## 2019-12-10 NOTE — Progress Notes (Signed)
Anesthesia Review:  PCP: Cardiologist : Chest x-ray : EKG : Echo : Cardiac Cath :  Sleep Study/ CPAP : Fasting Blood Sugar :      / Checks Blood Sugar -- times a day:   Blood Thinner/ Instructions /Last Dose: ASA / Instructions/ Last Dose :   Patient denies shortness of breath, chest pain, fever, and cough at this phone interview. FYI:  Patient is a Hospice patient.  Patient currently has oxygen at home- prn not using at present. Has PORT, CATHETER, SHUNT, and JTUBE.   Husband gave hsitory for procedure on 12/12/19 of ureteroscopy and stent exchange.   Husband reports episode this am of right eye drifting, placed patient on toilet and patient went limp then rigid.  Hospice nurse aware.  Hospice nurse to call PCP per husband of episode and husband to call oncologist of episode.  Gillian Shields RN has called and LVMM with Darrel Reach regarding episode at [facility].

## 2019-12-10 NOTE — Progress Notes (Signed)
Anesthesia Review:  PCP: Cardiologist : Chest x-ray : EKG : Echo : Cardiac Cath :  Sleep Study/ CPAP : Fasting Blood Sugar :      / Checks Blood Sugar -- times a day:   Blood Thinner/ Instructions /Last Dose: ASA / Instructions/ Last Dose :  Additional FYI:  Patient denies shortness of breath, chest pain, fever, and cough at this phone interview. Also: Husband reports on 12/10/2019 over weekend patient has not been eating has decreased fluid intake and urine is dark, Hospice nurse aware. Husband reports they have been pushing fluids via JTube.

## 2019-12-10 NOTE — Progress Notes (Signed)
On 12/12/2019- per husband- patient takes at 12 noon the following on a schedule- Zofran, Phenergan, Dilaudid, Prednisone10 mg, 1/2 of Lorazepam and Zoloft.

## 2019-12-11 MED ORDER — GENTAMICIN SULFATE 40 MG/ML IJ SOLN
5.0000 mg/kg | Freq: Once | INTRAVENOUS | Status: AC
  Administered 2019-12-12: 370 mg via INTRAVENOUS
  Filled 2019-12-11: qty 9.25

## 2019-12-11 NOTE — Progress Notes (Signed)
Darrel Reach from [facility] called back on 12/10/19 and stated DR Louis Meckel was made aware of message sent on 12/10/2019.  No new orders given.

## 2019-12-11 NOTE — Progress Notes (Signed)
Anesthesia Chart Review   Case: [id number] Date/Time: 12/12/19 1340   Procedure: URETEROSCOPY WITH HOLMIUM LASER LITHOTRIPSY  STENMT EXCHANGE (Left )   Anesthesia type: General   Pre-op diagnosis: LEFT URETERAL  STONE   Location: [facility] ROOM 06 / [facility]   Surgeons: [name], MD      DISCUSSION:44 y.o. 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 Louis Meckel.    Recent admission 2/7-12/05/19 w/sepsis due to UTI, possible pyelonephritis.  S/p left cystoscopy with stent placement 12/02/19.  No anesthesia complications noted.  DNR suspended for surgery.  Pt agreed to possible temporary post-op intubation.  Discuss DOS.   Pt's caregiver is her husband.  He reports decrease in food and fluid intake recently, pushing fluids via Jtube, he contacted hospice nurse 12/10/19 to discuss.  Dr. Louis Meckel made aware.  Will evaluate DOS.   VS: Ht 5\' 9"  (1.753 m)   Wt 83.9 kg   BMI 27.32 kg/m   PROVIDERS: Carol Ada, MD is PCP    LABS: Labs reviewed: Acceptable for surgery. (all labs ordered are listed, but only abnormal results are displayed)  Labs Reviewed - No data to display   IMAGES:   EKG: 06/06/2018 Rate 102 bpm  Sinus tachycardia   CV:  Past Medical History:  Diagnosis Date  . Anxiety   . Arthritis    arthritis in knees  . Asthma    asthma since a child. Well controlled  . Brain tumor ([place])   . 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 surgery x4 2015  . Kidney stone   . Migraine   . Pneumonia    last time 2 yr ago  . Recurrent upper respiratory infection (URI)   . Urticaria     Past Surgical History:  Procedure Laterality Date  . BRAIN SURGERY  2015   Brain surgery x 4 in 2015 at [place]    . CHOLECYSTECTOMY     . CYSTOSCOPY W/ URETERAL STENT PLACEMENT Right 10/26/2016   Procedure: CYSTOSCOPY WITH RETROGRADE PYELOGRAM/URETERAL STENT PLACEMENT;  Surgeon: [name], MD;  [facility];  Service: Urology;  Laterality: Right;  . CYSTOSCOPY WITH RETROGRADE PYELOGRAM, URETEROSCOPY AND STENT PLACEMENT Right 10/18/2014   Procedure: CYSTOSCOPY WITH RETROGRADE PYELOGRAM, URETEROSCOPY, STONE EXTRACTION AND STENT PLACEMENT;  Surgeon: Arvil Persons, MD;  [facility];  Service: Urology;  Laterality: Right;  . CYSTOSCOPY WITH RETROGRADE PYELOGRAM, URETEROSCOPY AND STENT PLACEMENT Right 10/29/2016   Procedure: CYSTOSCOPY WITH RETROGRADE PYELOGRAM, URETEROSCOPY AND STENT PLACEMENT;  Surgeon: [name], MD;  [facility];  Service: Urology;  Laterality: Right;  . CYSTOSCOPY WITH STENT PLACEMENT Left 12/02/2019   Procedure: CYSTOSCOPY WITH STENT PLACEMENT;  Surgeon: Festus Aloe, MD;  [facility];  Service: Urology;  Laterality: Left;  . CYSTOSTOMY W/ STENT INSERTION     numerous times  . HOLMIUM LASER APPLICATION Right [id number]   Procedure: HOLMIUM LASER APPLICATION;  Surgeon: Arvil Persons, MD;  [facility];  Service: Urology;  Laterality: Right;  . HOLMIUM LASER APPLICATION Right [id number]   Procedure: HOLMIUM LASER APPLICATION;  Surgeon: [name], MD;  [facility];  Service: Urology;  Laterality: Right;  . LASER ABLATION OF THE CERVIX  MEDICATIONS: . [START ON 12/12/2019] gentamicin (GARAMYCIN) 370 mg in dextrose 5 % 100 mL IVPB   . amphetamine-dextroamphetamine (ADDERALL XR) 20 MG 24 hr capsule  . ASMANEX, 30 METERED DOSES, 220 MCG/INH inhaler  . bisacodyl (DULCOLAX) 10 MG suppository  . budesonide (PULMICORT FLEXHALER) 180 MCG/ACT inhaler  . budesonide (RHINOCORT AQUA) 32 MCG/ACT nasal spray  . cetirizine (ZYRTEC) 10 MG tablet  . Eszopiclone 3 MG TABS  . fentaNYL (DURAGESIC) 75 MCG/HR  . HYDROmorphone (DILAUDID) 4 MG tablet  . ibuprofen  (ADVIL,MOTRIN) 200 MG tablet  . lansoprazole (PREVACID) 15 MG capsule  . levalbuterol (XOPENEX HFA) 45 MCG/ACT inhaler  . LIDOCAINE-PRILOCAINE EX  . loperamide (IMODIUM) 2 MG capsule  . LORazepam (ATIVAN) 0.5 MG tablet  . OLANZapine (ZYPREXA) 10 MG tablet  . ondansetron (ZOFRAN) 8 MG tablet  . OXYGEN  . polyethylene glycol (MIRALAX / GLYCOLAX) packet  . predniSONE (DELTASONE) 20 MG tablet  . promethazine (PHENERGAN) 25 MG tablet  . senna-docusate (SENOKOT-S) 8.6-50 MG tablet  . sertraline (ZOLOFT) 25 MG tablet  . sulfamethoxazole-trimethoprim (BACTRIM DS) 800-160 MG tablet  . tamsulosin (FLOMAX) 0.4 MG CAPS capsule    Maia Plan [facility] Pre-Surgical Testing [phone] 12/11/19  10:15 AM

## 2019-12-12 ENCOUNTER — Ambulatory Visit (HOSPITAL_COMMUNITY)
Admission: RE | Admit: 2019-12-12 | Discharge: 2019-12-12 | Disposition: A | Discharge status: Home / Self Care | Payer: BC Managed Care – PPO | Attending: Urology | Admitting: Urology

## 2019-12-12 ENCOUNTER — Ambulatory Visit (HOSPITAL_COMMUNITY): Payer: BC Managed Care – PPO | Admitting: Physician Assistant

## 2019-12-12 ENCOUNTER — Encounter (HOSPITAL_COMMUNITY): Payer: Self-pay | Admitting: Urology

## 2019-12-12 ENCOUNTER — Ambulatory Visit (HOSPITAL_COMMUNITY): Payer: BC Managed Care – PPO

## 2019-12-12 ENCOUNTER — Other Ambulatory Visit: Payer: Self-pay

## 2019-12-12 ENCOUNTER — Encounter (HOSPITAL_COMMUNITY)
Admission: RE | Disposition: A | Discharge status: Home / Self Care | Payer: Self-pay | Source: Home / Self Care | Attending: Urology

## 2019-12-12 DIAGNOSIS — J45909 Unspecified asthma, uncomplicated: Secondary | ICD-10-CM | POA: Diagnosis not present

## 2019-12-12 DIAGNOSIS — K219 Gastro-esophageal reflux disease without esophagitis: Secondary | ICD-10-CM | POA: Insufficient documentation

## 2019-12-12 DIAGNOSIS — Z7952 Long term (current) use of systemic steroids: Secondary | ICD-10-CM | POA: Diagnosis not present

## 2019-12-12 DIAGNOSIS — Z87442 Personal history of urinary calculi: Secondary | ICD-10-CM | POA: Diagnosis not present

## 2019-12-12 DIAGNOSIS — Z7951 Long term (current) use of inhaled steroids: Secondary | ICD-10-CM | POA: Insufficient documentation

## 2019-12-12 DIAGNOSIS — Z9221 Personal history of antineoplastic chemotherapy: Secondary | ICD-10-CM | POA: Insufficient documentation

## 2019-12-12 DIAGNOSIS — Z9981 Dependence on supplemental oxygen: Secondary | ICD-10-CM | POA: Diagnosis not present

## 2019-12-12 DIAGNOSIS — Z66 Do not resuscitate: Secondary | ICD-10-CM | POA: Diagnosis not present

## 2019-12-12 DIAGNOSIS — Z85841 Personal history of malignant neoplasm of brain: Secondary | ICD-10-CM | POA: Insufficient documentation

## 2019-12-12 DIAGNOSIS — Z923 Personal history of irradiation: Secondary | ICD-10-CM | POA: Diagnosis not present

## 2019-12-12 DIAGNOSIS — F419 Anxiety disorder, unspecified: Secondary | ICD-10-CM | POA: Insufficient documentation

## 2019-12-12 DIAGNOSIS — N132 Hydronephrosis with renal and ureteral calculous obstruction: Secondary | ICD-10-CM | POA: Diagnosis not present

## 2019-12-12 DIAGNOSIS — Z20822 Contact with and (suspected) exposure to covid-19: Secondary | ICD-10-CM | POA: Diagnosis not present

## 2019-12-12 DIAGNOSIS — N200 Calculus of kidney: Secondary | ICD-10-CM

## 2019-12-12 DIAGNOSIS — Z79899 Other long term (current) drug therapy: Secondary | ICD-10-CM | POA: Insufficient documentation

## 2019-12-12 HISTORY — PX: URETEROSCOPY WITH HOLMIUM LASER LITHOTRIPSY: SHX6645

## 2019-12-12 LAB — BASIC METABOLIC PANEL
Anion gap: 13 (ref 5–15)
BUN: 12 mg/dL (ref 6–20)
CO2: 21 mmol/L — ABNORMAL LOW (ref 22–32)
Calcium: 8.6 mg/dL — ABNORMAL LOW (ref 8.9–10.3)
Chloride: 103 mmol/L (ref 98–111)
Creatinine, Ser: 0.66 mg/dL (ref 0.44–1.00)
GFR calc Af Amer: >60 mL/min (ref >60)
GFR calc non Af Amer: >60 mL/min (ref >60)
Glucose, Bld: 129 mg/dL — ABNORMAL HIGH (ref 70–99)
Potassium: 4.1 mmol/L (ref 3.5–5.1)
Sodium: 137 mmol/L (ref 135–145)

## 2019-12-12 LAB — CBC
HCT: 43.4 % (ref 36.0–46.0)
Hemoglobin: 14 g/dL (ref 12.0–15.0)
MCH: 29.5 pg (ref 26.0–34.0)
MCHC: 32.3 g/dL (ref 30.0–36.0)
MCV: 91.4 fL (ref 80.0–100.0)
Platelets: 269 10*3/uL (ref 150–400)
RBC: 4.75 MIL/uL (ref 3.87–5.11)
RDW: 14.7 % (ref 11.5–15.5)
WBC: 10.7 10*3/uL — ABNORMAL HIGH (ref 4.0–10.5)
nRBC: 0 % (ref 0.0–0.2)

## 2019-12-12 LAB — RESPIRATORY PANEL BY RT PCR (FLU A&B, COVID)
Influenza A by PCR: NEGATIVE
Influenza B by PCR: NEGATIVE
SARS Coronavirus 2 by RT PCR: NEGATIVE

## 2019-12-12 SURGERY — URETEROSCOPY, WITH LITHOTRIPSY USING HOLMIUM LASER
Anesthesia: General | Laterality: Left

## 2019-12-12 MED ORDER — BELLADONNA ALKALOIDS-OPIUM 16.2-30 MG RE SUPP
RECTAL | Status: AC
  Filled 2019-12-12: qty 1

## 2019-12-12 MED ORDER — MEPERIDINE HCL 50 MG/ML IJ SOLN: 6.2500 mg | INTRAMUSCULAR | Status: DC | PRN

## 2019-12-12 MED ORDER — FENTANYL CITRATE (PF) 100 MCG/2ML IJ SOLN
INTRAMUSCULAR | Status: DC | PRN
  Administered 2019-12-12 (×6): 25 ug via INTRAVENOUS

## 2019-12-12 MED ORDER — OXYCODONE HCL 5 MG PO TABS: 5.0000 mg | ORAL_TABLET | Freq: Once | ORAL | Status: AC | PRN

## 2019-12-12 MED ORDER — MIDAZOLAM HCL 2 MG/2ML IJ SOLN
INTRAMUSCULAR | Status: AC
  Filled 2019-12-12: qty 2

## 2019-12-12 MED ORDER — DEXAMETHASONE SODIUM PHOSPHATE 10 MG/ML IJ SOLN
INTRAMUSCULAR | Status: DC | PRN
  Administered 2019-12-12: 8 mg via INTRAVENOUS

## 2019-12-12 MED ORDER — HYDROMORPHONE HCL 1 MG/ML IJ SOLN
1.0000 mg | INTRAMUSCULAR | Status: DC | PRN
  Administered 2019-12-12: 16:00:00 1 mg via INTRAVENOUS

## 2019-12-12 MED ORDER — ONDANSETRON HCL 4 MG/2ML IJ SOLN: 4.0000 mg | Freq: Once | INTRAMUSCULAR | Status: DC | PRN

## 2019-12-12 MED ORDER — LIDOCAINE HCL (CARDIAC) PF 100 MG/5ML IV SOSY
PREFILLED_SYRINGE | INTRAVENOUS | Status: DC | PRN
  Administered 2019-12-12: 40 mg via INTRAVENOUS

## 2019-12-12 MED ORDER — SODIUM CHLORIDE 0.9 % IV SOLN
INTRAVENOUS | Status: DC | PRN
  Administered 2019-12-12: 10 mL

## 2019-12-12 MED ORDER — HYDROMORPHONE HCL 1 MG/ML IJ SOLN
INTRAMUSCULAR | Status: AC
  Filled 2019-12-12: qty 1

## 2019-12-12 MED ORDER — LIDOCAINE 2% (20 MG/ML) 5 ML SYRINGE
INTRAMUSCULAR | Status: AC
  Filled 2019-12-12: qty 5

## 2019-12-12 MED ORDER — BELLADONNA ALKALOIDS-OPIUM 16.2-60 MG RE SUPP
RECTAL | Status: DC | PRN
  Administered 2019-12-12: 1 via RECTAL

## 2019-12-12 MED ORDER — ONDANSETRON HCL 4 MG/2ML IJ SOLN
INTRAMUSCULAR | Status: DC | PRN
  Administered 2019-12-12: 4 mg via INTRAVENOUS

## 2019-12-12 MED ORDER — TAMSULOSIN HCL 0.4 MG PO CAPS: 0.4000 mg | ORAL_CAPSULE | Freq: Every day | ORAL | 0 refills | Status: AC

## 2019-12-12 MED ORDER — ROCURONIUM BROMIDE 10 MG/ML (PF) SYRINGE
PREFILLED_SYRINGE | INTRAVENOUS | Status: AC
  Filled 2019-12-12: qty 10

## 2019-12-12 MED ORDER — FENTANYL CITRATE (PF) 100 MCG/2ML IJ SOLN
INTRAMUSCULAR | Status: AC
  Filled 2019-12-12: qty 2

## 2019-12-12 MED ORDER — DEXAMETHASONE SODIUM PHOSPHATE 10 MG/ML IJ SOLN
INTRAMUSCULAR | Status: AC
  Filled 2019-12-12: qty 1

## 2019-12-12 MED ORDER — ONDANSETRON HCL 4 MG/2ML IJ SOLN
INTRAMUSCULAR | Status: AC
  Filled 2019-12-12: qty 2

## 2019-12-12 MED ORDER — FENTANYL CITRATE (PF) 100 MCG/2ML IJ SOLN
25.0000 ug | INTRAMUSCULAR | Status: DC | PRN
  Administered 2019-12-12 (×2): 50 ug via INTRAVENOUS

## 2019-12-12 MED ORDER — ACETAMINOPHEN 325 MG PO TABS: 325.0000 mg | ORAL_TABLET | ORAL | Status: DC | PRN

## 2019-12-12 MED ORDER — SODIUM CHLORIDE 0.9 % IR SOLN
Status: DC | PRN
  Administered 2019-12-12: 6000 mL via INTRAVESICAL

## 2019-12-12 MED ORDER — OXYCODONE HCL 5 MG/5ML PO SOLN
5.0000 mg | Freq: Once | ORAL | Status: AC | PRN
  Administered 2019-12-12: 5 mg via ORAL

## 2019-12-12 MED ORDER — ACETAMINOPHEN 160 MG/5ML PO SOLN: 325.0000 mg | ORAL | Status: DC | PRN

## 2019-12-12 MED ORDER — PROPOFOL 10 MG/ML IV BOLUS
INTRAVENOUS | Status: AC
  Filled 2019-12-12: qty 20

## 2019-12-12 MED ORDER — PROPOFOL 10 MG/ML IV BOLUS
INTRAVENOUS | Status: DC | PRN
  Administered 2019-12-12: 150 mg via INTRAVENOUS

## 2019-12-12 MED ORDER — LACTATED RINGERS IV SOLN: INTRAVENOUS | Status: DC

## 2019-12-12 MED ORDER — OXYCODONE HCL 5 MG/5ML PO SOLN
ORAL | Status: AC
  Filled 2019-12-12: qty 5

## 2019-12-12 SURGICAL SUPPLY — 23 items
BAG URO CATCHER STRL LF (MISCELLANEOUS) ×3 IMPLANT
BASKET STONE 1.7 NGAGE (UROLOGICAL SUPPLIES) ×2 IMPLANT
BASKET ZERO TIP NITINOL 2.4FR (BASKET) IMPLANT
CATH FOLEY 2WAY SLVR  5CC 16FR (CATHETERS) ×3
CATH FOLEY 2WAY SLVR 5CC 16FR (CATHETERS) IMPLANT
CATH URET 5FR 28IN OPEN ENDED (CATHETERS) ×3 IMPLANT
CLOTH BEACON ORANGE TIMEOUT ST (SAFETY) ×3 IMPLANT
EXTRACTOR STONE 1.7FRX115CM (UROLOGICAL SUPPLIES) IMPLANT
FIBER LASER FLEXIVA 365 (UROLOGICAL SUPPLIES) ×2 IMPLANT
GLOVE BIOGEL M STRL SZ7.5 (GLOVE) ×3 IMPLANT
GOWN STRL REUS W/TWL XL LVL3 (GOWN DISPOSABLE) ×3 IMPLANT
GUIDEWIRE ANG ZIPWIRE 038X150 (WIRE) IMPLANT
GUIDEWIRE STR DUAL SENSOR (WIRE) ×3 IMPLANT
KIT TURNOVER KIT A (KITS) IMPLANT
MANIFOLD NEPTUNE II (INSTRUMENTS) ×3 IMPLANT
PACK CYSTO (CUSTOM PROCEDURE TRAY) ×3 IMPLANT
SHEATH URETERAL 12FRX28CM (UROLOGICAL SUPPLIES) IMPLANT
SHEATH URETERAL 12FRX35CM (MISCELLANEOUS) IMPLANT
STENT POLARIS 5FRX26 (STENTS) ×2 IMPLANT
TUBING CONNECTING 10 (TUBING) ×2 IMPLANT
TUBING CONNECTING 10' (TUBING) ×1
TUBING UROLOGY SET (TUBING) ×3 IMPLANT
WIRE COONS/BENSON .038X145CM (WIRE) IMPLANT

## 2019-12-12 NOTE — Interval H&P Note (Signed)
History and Physical Interval Note:  Patient presents today for removal of the stone.  She has been on antibiotics since discharge.  She has had some neurologic decline, but remains alert and aware of the procedure we are doing today.  She wants to proceed.  12/12/2019 12:12 PM  Stephani Police [name]  has presented today for surgery, with the diagnosis of LEFT URETERAL  STONE.  The various methods of treatment have been discussed with the patient and family. After consideration of risks, benefits and other options for treatment, the patient has consented to  Procedure(s): URETEROSCOPY WITH HOLMIUM LASER LITHOTRIPSY  STENMT EXCHANGE (Left) as a surgical intervention.  The patient's history has been reviewed, patient examined, no change in status, stable for surgery.  I have reviewed the patient's chart and labs.  Questions were answered to the patient's satisfaction.     [name]

## 2019-12-12 NOTE — Anesthesia Postprocedure Evaluation (Signed)
Anesthesia Post Note  Patient: [name]  Procedure(s) Performed: URETEROSCOPY WITH HOLMIUM LASER LITHOTRIPSY  STENMT EXCHANGE (Left )     Patient location during evaluation: PACU Anesthesia Type: General Level of consciousness: awake and alert Pain management: pain level controlled Vital Signs Assessment: post-procedure vital signs reviewed and stable Respiratory status: spontaneous breathing, nonlabored ventilation, respiratory function stable and patient connected to nasal cannula oxygen Cardiovascular status: blood pressure returned to baseline and stable Postop Assessment: no apparent nausea or vomiting Anesthetic complications: no    Last Vitals:  Vitals:   12/12/19 1445 12/12/19 1500  BP: 140/87 (!) 142/85  Pulse: (!) 102 (!) 106  Resp: 17 18  Temp:    SpO2: 100% 96%    Last Pain:  Vitals:   12/12/19 1500  TempSrc:   PainSc: 9                  Johari Pinney

## 2019-12-12 NOTE — Anesthesia Procedure Notes (Signed)
Procedure Name: LMA Insertion Date/Time: 12/12/2019 1:18 PM Performed by: Glory Buff, CRNA Pre-anesthesia Checklist: Patient identified, Emergency Drugs available, Suction available and Patient being monitored Patient Re-evaluated:Patient Re-evaluated prior to induction Oxygen Delivery Method: Circle system utilized Preoxygenation: Pre-oxygenation with 100% oxygen Induction Type: IV induction Ventilation: Mask ventilation without difficulty LMA: LMA with gastric port inserted LMA Size: 4.0 Number of attempts: 1 Placement Confirmation: positive ETCO2 Tube secured with: Tape Dental Injury: Teeth and Oropharynx as per pre-operative assessment

## 2019-12-12 NOTE — Anesthesia Preprocedure Evaluation (Addendum)
Anesthesia Evaluation  Patient identified by MRN, date of birth, ID band Patient confused  General Assessment Comment:Acute metabolic encephalopathy in the setting of urosepsis  Reviewed: Allergy & Precautions, H&P , NPO status , Patient's Chart, lab work & pertinent test results  Airway Mallampati: III  TM Distance: >3 FB Neck ROM: Full    Dental  (+) Teeth Intact, Dental Advisory Given   Pulmonary asthma ,    Pulmonary exam normal breath sounds clear to auscultation       Cardiovascular negative cardio ROS   Rhythm:Regular Rate:Tachycardia     Neuro/Psych  Headaches, PSYCHIATRIC DISORDERS Anxiety Astrocytoma s/p surgery, chemo, radiation     GI/Hepatic Neg liver ROS, GERD  Medicated,  Endo/Other  negative endocrine ROS  Renal/GU Renal diseaseleft ureteral stone, UTI     Musculoskeletal  (+) Arthritis ,   Abdominal   Peds  Hematology  (+) Blood dyscrasia (Thrombocytopenia), ,   Anesthesia Other Findings Day of surgery medications reviewed with the patient.  Reproductive/Obstetrics                            Anesthesia Physical  Anesthesia Plan  ASA: IV  Anesthesia Plan: General   Post-op Pain Management:    Induction: Intravenous, Rapid sequence and Cricoid pressure planned  PONV Risk Score and Plan: 4 or greater and Midazolam, Dexamethasone and Ondansetron  Airway Management Planned: Oral ETT and LMA  Additional Equipment:   Intra-op Plan:   Post-operative Plan:   Informed Consent: I have reviewed the patients History and Physical, chart, labs and discussed the procedure including the risks, benefits and alternatives for the proposed anesthesia with the patient or authorized representative who has indicated his/her understanding and acceptance.   Patient has DNR.  Discussed DNR with patient and Suspend DNR.   Dental advisory given  Plan Discussed with: CRNA,  Anesthesiologist and Surgeon  Anesthesia Plan Comments: (Discussed anesthetic plan with patient and patient's husband.  DNR suspended for surgery. Patient agrees to possible temporary post-op intubation.)       Anesthesia Quick Evaluation

## 2019-12-12 NOTE — Discharge Instructions (Signed)
DISCHARGE INSTRUCTIONS FOR KIDNEY STONE/URETERAL STENT   MEDICATIONS:  1. Resume all your other meds from home 2.  Continue the Bactrim as prescribed through March 17  ACTIVITY:  1. No strenuous activity x 1week  2. No driving while on narcotic pain medications  3. Drink plenty of water  4. Continue to walk at home - you can still get blood clots when you are at home, so keep active, but don't over do it.  5. May return to work/school tomorrow or when you feel ready   BATHING:  1. You can shower and we recommend daily showers  2. You have a string coming from your urethra: The stent string is attached to your ureteral stent. Do not pull on this.   SIGNS/SYMPTOMS TO CALL:  Please call us if you have a fever greater than 101.5, uncontrolled nausea/vomiting, uncontrolled pain, dizziness, unable to urinate, bloody urine, chest pain, shortness of breath, leg swelling, leg pain, redness around wound, drainage from wound, or any other concerns or questions.   You can reach [place] at [phone].   Stent removal:  1.  There is a string attached to the patient's left ureteral stent.  This is brought through the urethra and taped to the Foley catheter.  This should be removed by an taping the strain from the catheter and then gently pulling on the string until the stent is completely out.  This can be done on Monday, March 15.  Follow-up: The patient can come back as needed.  Foley Catheter Care A soft, flexible tube (Foley catheter) may have been placed in your bladder to drain urine and fluid. Follow these instructions: Taking Care of the Catheter  Keep the area where the catheter leaves your body clean.   Attach the catheter to the leg so there is no tension on the catheter.   Keep the drainage bag below the level of the bladder, but keep it OFF the floor.   Do not take long soaking baths. Your caregiver will give instructions about showering.   Wash your hands before touching ANYTHING  related to the catheter or bag.   Using mild soap and warm water on a washcloth:   Clean the area closest to the catheter insertion site using a circular motion around the catheter.   Clean the catheter itself by wiping AWAY from the insertion site for several inches down the tube.   NEVER wipe upward as this could sweep bacteria up into the urethra (tube in your body that normally drains the bladder) and cause infection.   Place a small amount of sterile lubricant at the tip of the penis where the catheter is entering.  Taking Care of the Drainage Bags  Two drainage bags may be taken home: a large overnight drainage bag, and a smaller leg bag which fits underneath clothing.   It is okay to wear the overnight bag at any time, but NEVER wear the smaller leg bag at night.   Keep the drainage bag well below the level of your bladder. This prevents backflow of urine into the bladder and allows the urine to drain freely.   Anchor the tubing to your leg to prevent pulling or tension on the catheter. Use tape or a leg strap provided by the hospital.   Empty the drainage bag when it is 1/2 to 3/4 full. Wash your hands before and after touching the bag.   Periodically check the tubing for kinks to make sure there is no pressure  on the tubing which could restrict the flow of urine.  Changing the Drainage Bags  Cleanse both ends of the clean bag with alcohol before changing.   Pinch off the rubber catheter to avoid urine spillage during the disconnection.   Disconnect the dirty bag and connect the clean one.   Empty the dirty bag carefully to avoid a urine spill.   Attach the new bag to the leg with tape or a leg strap.  Cleaning the Drainage Bags  Whenever a drainage bag is disconnected, it must be cleaned quickly so it is ready for the next use.   Wash the bag in warm, soapy water.   Rinse the bag thoroughly with warm water.   Soak the bag for 30 minutes in a solution of white  vinegar and water (1 cup vinegar to 1 quart warm water).   Rinse with warm water.  SEEK MEDICAL CARE IF:   You have chills or night sweats.   You are leaking around your catheter or have problems with your catheter. It is not uncommon to have sporadic leakage around your catheter as a result of bladder spasms. If the leakage stops, there is not much need for concern. If you are uncertain, call your caregiver.   You develop side effects that you think are coming from your medicines.  SEEK IMMEDIATE MEDICAL CARE IF:   You are suddenly unable to urinate. Check to see if there are any kinks in the drainage tubing that may cause this. If you cannot find any kinks, call your caregiver immediately. This is an emergency.   You develop shortness of breath or chest pains.   Bleeding persists or clots develop in your urine.   You have a fever.   You develop pain in your back or over your lower belly (abdomen).   You develop pain or swelling in your legs.   Any problems you are having get worse rather than better.  MAKE SURE YOU:   Understand these instructions.   Will watch your condition.   Will get help right away if you are not doing well or get worse.    THE CATHETER SHOULD BE CHANGED EVERY 4 WEEKS.

## 2019-12-12 NOTE — Transfer of Care (Signed)
Immediate Anesthesia Transfer of Care Note  Patient: [name]  Procedure(s) Performed: URETEROSCOPY WITH HOLMIUM LASER LITHOTRIPSY  STENMT EXCHANGE (Left )  Patient Location: PACU  Anesthesia Type:General  Level of Consciousness: drowsy, patient cooperative and responds to stimulation  Airway & Oxygen Therapy: Patient Spontanous Breathing and Patient connected to face mask oxygen  Post-op Assessment: Report given to RN and Post -op Vital signs reviewed and stable  Post vital signs: Reviewed and stable  Last Vitals:  Vitals Value Taken Time  BP 136/86 12/12/19 1430  Temp    Pulse 102 12/12/19 1431  Resp 17 12/12/19 1431  SpO2 98 % 12/12/19 1431  Vitals shown include unvalidated device data.  Last Pain:  Vitals:   12/12/19 1145  TempSrc: Oral         Complications: No apparent anesthesia complications

## 2019-12-12 NOTE — Progress Notes (Signed)
Spoke with husband Gerald Stabs to complete preop question and consent.  He states he is the HCPOA and Pt has DNR order paperwork but he forgot it.  He states that Pt had 300cc water via G/J tube at 0900 but nothing to eat since last night prior to MN.  She eats but takes meds and fluids via tube.  Also states Pt prefers lying all the way over on either hip.  Has low back pain.  If positioned on back prefers feet/ legs raised. She takes Ativan and phenergan for nausea at scheduled times and has missed noon doses.  Has left chest port.

## 2019-12-12 NOTE — Op Note (Signed)
Preoperative diagnosis: left ureteral calculus  Postoperative diagnosis: left ureteral calculus  Procedure:  1. Cystoscopy 2. left ureteroscopy and stone removal 3. Ureteroscopic laser lithotripsy 4. left 19F x 26 ureteral stent placement - polaris 5. left retrograde pyelography with interpretation  Surgeon: [name], MD  Anesthesia: General  Complications: None  Intraoperative findings: left retrograde pyelography demonstrated a filling defect within the left ureter consistent with the patient's known calculus without other abnormalities.  EBL: Minimal  Specimens: 1. left ureteral calculus  Disposition of specimens: Given to patient  Indication: [name] is a 44 y.o.   patient with a extensive history of kidney stones who presented to the hospital 10 days ago with urosepsis.  A stent was placed by Dr. Junious Silk, and she was sent home on antibiotics.  She continued on antibiotics over the past 10 days and follows up today for removal of the stone and stent exchange. After reviewing the management options for treatment, the patient elected to proceed with the above surgical procedure(s). We have discussed the potential benefits and risks of the procedure, side effects of the proposed treatment, the likelihood of the patient achieving the goals of the procedure, and any potential problems that might occur during the procedure or recuperation. Informed consent has been obtained.   Description of procedure:  The patient was taken to the operating room and general anesthesia was induced.  The patient was placed in the dorsal lithotomy position, prepped and draped in the usual sterile fashion, and preoperative antibiotics were administered. A preoperative time-out was performed.   21 French 30 degree cystoscope was gently passed to the patient's urethra and into the bladder under visual guidance.  The stent was grasped from the tip and pulled to the urethral meatus.   A 0.38 sensor wire was advanced through the stent and up into the left renal pelvis under fluoroscopic guidance the stent was removed.  Stent was then exchanged for a 5 [place] open-ended ureteral catheter and retrograde pyelogram was performed with the above findings.  Stent was then readvanced through the open-ended catheter and the catheter was removed.  Then advanced a 6/4 French semirigid ureteroscope through the patient's urethra and cannulated the patient's left ureteral orifice.  I was able to gently navigate to the stone which was just proximal to the UVJ.  The stones were too large to remove without laser fragmentation.  As such I used a 42 m laser fiber with settings of 10 Hz and 1.0 J and fragmented the stone into pieces that were small enough to remove with the engage stone basket.  Once the stones were completely removed I reinspected the ureter noting no significant stone fragments remaining.  I then exchanged the safety wire for ureteral catheter again and performed retrograde pyelogram ensuring there were no additional filling defects.  I then advanced the cystoscope over the wire and passed it 26 cm x 5 French Polaris stent over the wire and into the left renal pelvis under fluoroscopic guidance.  It was nice Truman Hayward curled in the renal pelvis when the wire was completely removed.  Stent was brought to the patient's urethra.  A Foley catheter was then reinserted.  The stent tether was wrapped around the catheter and secured with tape.  A B&O suppositories placed in the patient's rectum she was subsequently extubated return the PACU in stable condition.  Disposition: The patient and her family will be instructed to remove the stent in 5 days.  She will continue antibiotics  until that time.  The catheter will stay and needs to be changed every 4 weeks.

## 2019-12-13 ENCOUNTER — Encounter: Payer: Self-pay | Admitting: *Deleted

## 2020-01-03 DEATH — deceased

## 2020-02-05 ENCOUNTER — Ambulatory Visit: Payer: Self-pay | Admitting: Allergy & Immunology
